# Patient Record
Sex: Male | Born: 1955 | Race: White | Hispanic: No | Marital: Married | Smoking: Former smoker
Health system: Southern US, Community
[De-identification: ages and names within clinical notes are randomized; demographics above are authoritative.]

## PROBLEM LIST (undated history)

## (undated) DIAGNOSIS — I739 Peripheral vascular disease, unspecified: Secondary | ICD-10-CM

## (undated) DIAGNOSIS — R251 Tremor, unspecified: Secondary | ICD-10-CM

## (undated) DIAGNOSIS — J439 Emphysema, unspecified: Secondary | ICD-10-CM

## (undated) DIAGNOSIS — F319 Bipolar disorder, unspecified: Secondary | ICD-10-CM

## (undated) DIAGNOSIS — E785 Hyperlipidemia, unspecified: Secondary | ICD-10-CM

## (undated) DIAGNOSIS — I10 Essential (primary) hypertension: Secondary | ICD-10-CM

## (undated) DIAGNOSIS — I639 Cerebral infarction, unspecified: Secondary | ICD-10-CM

## (undated) DIAGNOSIS — K5900 Constipation, unspecified: Secondary | ICD-10-CM

## (undated) DIAGNOSIS — K219 Gastro-esophageal reflux disease without esophagitis: Secondary | ICD-10-CM

## (undated) DIAGNOSIS — E039 Hypothyroidism, unspecified: Secondary | ICD-10-CM

## (undated) DIAGNOSIS — G629 Polyneuropathy, unspecified: Secondary | ICD-10-CM

## (undated) DIAGNOSIS — F1010 Alcohol abuse, uncomplicated: Secondary | ICD-10-CM

## (undated) DIAGNOSIS — N4281 Prostatodynia syndrome: Secondary | ICD-10-CM

## (undated) HISTORY — DX: Prostatodynia syndrome: N42.81

## (undated) HISTORY — PX: ILIAC ARTERY STENT: SHX1786

## (undated) HISTORY — DX: Constipation, unspecified: K59.00

## (undated) HISTORY — DX: Hypothyroidism, unspecified: E03.9

## (undated) HISTORY — DX: Tremor, unspecified: R25.1

## (undated) HISTORY — DX: Hyperlipidemia, unspecified: E78.5

## (undated) HISTORY — DX: Emphysema, unspecified: J43.9

## (undated) HISTORY — DX: Polyneuropathy, unspecified: G62.9

## (undated) HISTORY — DX: Essential (primary) hypertension: I10

## (undated) HISTORY — PX: OTHER SURGICAL HISTORY: SHX169

## (undated) HISTORY — DX: Peripheral vascular disease, unspecified: I73.9

## (undated) HISTORY — PX: COLONOSCOPY: SHX174

## (undated) HISTORY — DX: Alcohol abuse, uncomplicated: F10.10

## (undated) HISTORY — DX: Bipolar disorder, unspecified: F31.9

## (undated) HISTORY — DX: Cerebral infarction, unspecified: I63.9

## (undated) HISTORY — DX: Gastro-esophageal reflux disease without esophagitis: K21.9

---

## 2006-04-11 ENCOUNTER — Encounter: Payer: Self-pay | Admitting: Family Medicine

## 2006-04-11 ENCOUNTER — Ambulatory Visit: Payer: Self-pay | Admitting: Family Medicine

## 2006-04-11 DIAGNOSIS — M545 Low back pain, unspecified: Secondary | ICD-10-CM | POA: Insufficient documentation

## 2006-04-11 DIAGNOSIS — F3132 Bipolar disorder, current episode depressed, moderate: Secondary | ICD-10-CM

## 2006-04-11 DIAGNOSIS — E785 Hyperlipidemia, unspecified: Secondary | ICD-10-CM | POA: Insufficient documentation

## 2006-04-11 DIAGNOSIS — K219 Gastro-esophageal reflux disease without esophagitis: Secondary | ICD-10-CM | POA: Insufficient documentation

## 2006-04-11 DIAGNOSIS — R259 Unspecified abnormal involuntary movements: Secondary | ICD-10-CM | POA: Insufficient documentation

## 2006-04-11 DIAGNOSIS — F1010 Alcohol abuse, uncomplicated: Secondary | ICD-10-CM | POA: Insufficient documentation

## 2006-04-11 LAB — CONVERTED CEMR LAB
BUN: 15 mg/dL (ref 6–23)
CO2: 26 meq/L (ref 19–32)
Calcium: 9.9 mg/dL (ref 8.4–10.5)
Chloride: 103 meq/L (ref 96–112)
Cholesterol: 146 mg/dL (ref 0–200)
Creatinine, Ser: 0.88 mg/dL (ref 0.40–1.50)
Glucose, Bld: 81 mg/dL (ref 70–99)
HDL: 55 mg/dL (ref >39)
Total CHOL/HDL Ratio: 2.7
Triglycerides: 226 mg/dL — ABNORMAL HIGH (ref <150)

## 2006-04-12 ENCOUNTER — Encounter (INDEPENDENT_AMBULATORY_CARE_PROVIDER_SITE_OTHER): Payer: Self-pay | Admitting: *Deleted

## 2006-04-18 ENCOUNTER — Telehealth (INDEPENDENT_AMBULATORY_CARE_PROVIDER_SITE_OTHER): Payer: Self-pay | Admitting: *Deleted

## 2006-05-18 ENCOUNTER — Encounter: Payer: Self-pay | Admitting: Family Medicine

## 2006-07-06 ENCOUNTER — Encounter: Payer: Self-pay | Admitting: Family Medicine

## 2006-08-10 ENCOUNTER — Ambulatory Visit: Payer: Self-pay | Admitting: Family Medicine

## 2007-02-07 ENCOUNTER — Ambulatory Visit: Payer: Self-pay | Admitting: Family Medicine

## 2007-03-06 ENCOUNTER — Telehealth: Payer: Self-pay | Admitting: Family Medicine

## 2007-04-13 ENCOUNTER — Ambulatory Visit: Payer: Self-pay | Admitting: Family Medicine

## 2007-04-13 DIAGNOSIS — E039 Hypothyroidism, unspecified: Secondary | ICD-10-CM

## 2007-04-15 ENCOUNTER — Encounter: Payer: Self-pay | Admitting: Family Medicine

## 2007-04-17 ENCOUNTER — Encounter: Payer: Self-pay | Admitting: Family Medicine

## 2007-04-17 LAB — CONVERTED CEMR LAB
Albumin: 4.6 g/dL (ref 3.5–5.2)
BUN: 10 mg/dL (ref 6–23)
CO2: 23 meq/L (ref 19–32)
Calcium: 9.1 mg/dL (ref 8.4–10.5)
Chloride: 105 meq/L (ref 96–112)
Cholesterol: 161 mg/dL (ref 0–200)
Creatinine, Ser: 0.86 mg/dL (ref 0.40–1.50)
HDL goal, serum: 40 mg/dL
HDL: 61 mg/dL (ref >39)
LDL Goal: 160 mg/dL
PSA: 1.71 ng/mL (ref 0.10–4.00)
Potassium: 4.4 meq/L (ref 3.5–5.3)
Total CHOL/HDL Ratio: 2.6

## 2007-06-26 ENCOUNTER — Encounter: Payer: Self-pay | Admitting: Family Medicine

## 2007-07-18 ENCOUNTER — Telehealth: Payer: Self-pay | Admitting: Family Medicine

## 2007-07-19 ENCOUNTER — Encounter: Payer: Self-pay | Admitting: Family Medicine

## 2007-07-19 LAB — CONVERTED CEMR LAB
T3 Uptake Ratio: 35.4 % (ref 22.5–37.0)
T3, Free: 2.8 pg/mL (ref 2.3–4.2)

## 2007-08-09 ENCOUNTER — Encounter: Payer: Self-pay | Admitting: Family Medicine

## 2007-10-10 ENCOUNTER — Encounter: Payer: Self-pay | Admitting: Family Medicine

## 2007-11-27 ENCOUNTER — Encounter: Payer: Self-pay | Admitting: Family Medicine

## 2008-01-08 ENCOUNTER — Encounter: Payer: Self-pay | Admitting: Family Medicine

## 2008-01-30 HISTORY — PX: DEEP BRAIN STIMULATOR PLACEMENT: SHX608

## 2008-02-20 ENCOUNTER — Ambulatory Visit: Payer: Self-pay | Admitting: Family Medicine

## 2008-03-01 ENCOUNTER — Encounter: Payer: Self-pay | Admitting: Family Medicine

## 2008-03-04 ENCOUNTER — Encounter: Payer: Self-pay | Admitting: Family Medicine

## 2008-04-02 ENCOUNTER — Ambulatory Visit: Payer: Self-pay | Admitting: Family Medicine

## 2008-04-02 DIAGNOSIS — M25476 Effusion, unspecified foot: Secondary | ICD-10-CM

## 2008-04-02 DIAGNOSIS — R269 Unspecified abnormalities of gait and mobility: Secondary | ICD-10-CM

## 2008-04-02 DIAGNOSIS — M25473 Effusion, unspecified ankle: Secondary | ICD-10-CM

## 2008-04-03 LAB — CONVERTED CEMR LAB
ALT: 18 units/L (ref 0–53)
BUN: 10 mg/dL (ref 6–23)
CO2: 25 meq/L (ref 19–32)
Calcium: 9.7 mg/dL (ref 8.4–10.5)
Cholesterol: 135 mg/dL (ref 0–200)
Creatinine, Ser: 0.95 mg/dL (ref 0.40–1.50)
HDL: 40 mg/dL (ref >39)
Lithium Lvl: 0.15 meq/L — ABNORMAL LOW (ref 0.80–1.40)
Total Bilirubin: 0.4 mg/dL (ref 0.3–1.2)
Total CHOL/HDL Ratio: 3.4
Triglycerides: 245 mg/dL — ABNORMAL HIGH (ref <150)
VLDL: 49 mg/dL — ABNORMAL HIGH (ref 0–40)

## 2008-04-05 ENCOUNTER — Encounter: Payer: Self-pay | Admitting: Family Medicine

## 2008-04-10 ENCOUNTER — Telehealth: Payer: Self-pay | Admitting: Family Medicine

## 2008-04-15 ENCOUNTER — Encounter: Payer: Self-pay | Admitting: Family Medicine

## 2008-05-16 ENCOUNTER — Telehealth: Payer: Self-pay | Admitting: Family Medicine

## 2008-06-07 ENCOUNTER — Ambulatory Visit: Payer: Self-pay | Admitting: Family Medicine

## 2008-06-11 ENCOUNTER — Encounter: Payer: Self-pay | Admitting: Family Medicine

## 2008-06-19 ENCOUNTER — Encounter: Payer: Self-pay | Admitting: Family Medicine

## 2008-06-19 DIAGNOSIS — E118 Type 2 diabetes mellitus with unspecified complications: Secondary | ICD-10-CM | POA: Insufficient documentation

## 2008-06-19 DIAGNOSIS — R7301 Impaired fasting glucose: Secondary | ICD-10-CM

## 2008-07-27 ENCOUNTER — Encounter: Payer: Self-pay | Admitting: Family Medicine

## 2008-12-16 ENCOUNTER — Telehealth: Payer: Self-pay | Admitting: Family Medicine

## 2008-12-26 ENCOUNTER — Encounter: Payer: Self-pay | Admitting: Family Medicine

## 2009-04-14 ENCOUNTER — Encounter: Admission: RE | Admit: 2009-04-14 | Discharge: 2009-04-14 | Payer: Self-pay | Admitting: Family Medicine

## 2009-04-14 ENCOUNTER — Ambulatory Visit: Payer: Self-pay | Admitting: Family Medicine

## 2009-04-14 DIAGNOSIS — R05 Cough: Secondary | ICD-10-CM

## 2009-04-14 DIAGNOSIS — N508 Other specified disorders of male genital organs: Secondary | ICD-10-CM

## 2009-04-14 DIAGNOSIS — R059 Cough, unspecified: Secondary | ICD-10-CM | POA: Insufficient documentation

## 2009-04-14 DIAGNOSIS — R6882 Decreased libido: Secondary | ICD-10-CM | POA: Insufficient documentation

## 2009-04-14 DIAGNOSIS — M25559 Pain in unspecified hip: Secondary | ICD-10-CM | POA: Insufficient documentation

## 2009-04-15 LAB — CONVERTED CEMR LAB
ALT: 19 units/L (ref 0–53)
AST: 20 units/L (ref 0–37)
Albumin: 4.3 g/dL (ref 3.5–5.2)
Alkaline Phosphatase: 76 units/L (ref 39–117)
LDL Cholesterol: 44 mg/dL (ref 0–99)
Potassium: 4.6 meq/L (ref 3.5–5.3)
Sex Hormone Binding: 22 nmol/L (ref 13–71)
Sodium: 140 meq/L (ref 135–145)
TSH: 0.048 microintl units/mL — ABNORMAL LOW (ref 0.350–4.500)
Testosterone Free: 77.1 pg/mL (ref 47.0–244.0)
Testosterone-% Free: 2.4 % (ref 1.6–2.9)
Testosterone: 314.86 ng/dL — ABNORMAL LOW (ref 350–890)
Total Bilirubin: 0.4 mg/dL (ref 0.3–1.2)
Total Protein: 6.6 g/dL (ref 6.0–8.3)
Triglycerides: 207 mg/dL — ABNORMAL HIGH (ref <150)
VLDL: 41 mg/dL — ABNORMAL HIGH (ref 0–40)

## 2009-04-16 ENCOUNTER — Encounter: Payer: Self-pay | Admitting: Family Medicine

## 2009-04-21 ENCOUNTER — Ambulatory Visit: Payer: Self-pay | Admitting: Family Medicine

## 2009-04-21 DIAGNOSIS — J449 Chronic obstructive pulmonary disease, unspecified: Secondary | ICD-10-CM

## 2009-04-29 ENCOUNTER — Encounter: Payer: Self-pay | Admitting: Family Medicine

## 2009-05-01 ENCOUNTER — Encounter: Payer: Self-pay | Admitting: Family Medicine

## 2009-07-22 ENCOUNTER — Ambulatory Visit: Payer: Self-pay | Admitting: Family Medicine

## 2009-07-22 DIAGNOSIS — K589 Irritable bowel syndrome without diarrhea: Secondary | ICD-10-CM

## 2009-07-23 LAB — CONVERTED CEMR LAB: TSH: 0.366 microintl units/mL (ref 0.350–4.500)

## 2009-08-30 ENCOUNTER — Ambulatory Visit: Payer: Self-pay | Admitting: Family Medicine

## 2009-08-31 ENCOUNTER — Encounter: Payer: Self-pay | Admitting: Family Medicine

## 2009-10-08 ENCOUNTER — Ambulatory Visit: Payer: Self-pay | Admitting: Family Medicine

## 2009-10-08 DIAGNOSIS — E291 Testicular hypofunction: Secondary | ICD-10-CM | POA: Insufficient documentation

## 2009-10-08 DIAGNOSIS — G250 Essential tremor: Secondary | ICD-10-CM

## 2009-10-08 DIAGNOSIS — R5383 Other fatigue: Secondary | ICD-10-CM | POA: Insufficient documentation

## 2009-10-10 ENCOUNTER — Encounter: Payer: Self-pay | Admitting: Family Medicine

## 2009-10-10 LAB — CONVERTED CEMR LAB
Albumin: 4.5 g/dL (ref 3.5–5.2)
BUN: 8 mg/dL (ref 6–23)
Calcium: 8.9 mg/dL (ref 8.4–10.5)
Chloride: 105 meq/L (ref 96–112)
Creatinine, Ser: 0.96 mg/dL (ref 0.40–1.50)
Eosinophils Absolute: 0.2 10*3/uL (ref 0.0–0.7)
Eosinophils Relative: 1 % (ref 0–5)
Glucose, Bld: 118 mg/dL — ABNORMAL HIGH (ref 70–99)
HCT: 44.8 % (ref 39.0–52.0)
Hemoglobin: 14.7 g/dL (ref 13.0–17.0)
Lithium Lvl: 0.35 meq/L — ABNORMAL LOW (ref 0.80–1.40)
Lymphocytes Relative: 15 % (ref 12–46)
Lymphs Abs: 1.6 10*3/uL (ref 0.7–4.0)
MCV: 92.9 fL (ref 78.0–100.0)
Monocytes Absolute: 0.5 10*3/uL (ref 0.1–1.0)
Platelets: 223 10*3/uL (ref 150–400)
Potassium: 4.7 meq/L (ref 3.5–5.3)
RDW: 13.2 % (ref 11.5–15.5)
TSH: 0.354 microintl units/mL (ref 0.350–4.500)
Testosterone-% Free: 2.1 % (ref 1.6–2.9)
Testosterone: 290.54 ng/dL — ABNORMAL LOW (ref 350–890)
Total CK: 106 units/L (ref 7–232)

## 2009-10-13 ENCOUNTER — Encounter: Payer: Self-pay | Admitting: Family Medicine

## 2009-11-03 ENCOUNTER — Telehealth (INDEPENDENT_AMBULATORY_CARE_PROVIDER_SITE_OTHER): Payer: Self-pay | Admitting: *Deleted

## 2009-11-06 ENCOUNTER — Ambulatory Visit: Payer: Self-pay | Admitting: Family

## 2009-11-07 ENCOUNTER — Encounter: Payer: Self-pay | Admitting: Family

## 2009-11-07 LAB — CONVERTED CEMR LAB: Sed Rate: 4 mm/hr (ref 0–16)

## 2009-11-10 ENCOUNTER — Telehealth: Payer: Self-pay | Admitting: Family

## 2009-11-10 ENCOUNTER — Encounter: Payer: Self-pay | Admitting: Family

## 2009-11-12 ENCOUNTER — Encounter: Payer: Self-pay | Admitting: Family

## 2009-11-17 ENCOUNTER — Encounter: Payer: Self-pay | Admitting: Family

## 2009-11-25 ENCOUNTER — Encounter: Payer: Self-pay | Admitting: Family Medicine

## 2009-12-03 ENCOUNTER — Encounter: Payer: Self-pay | Admitting: Family Medicine

## 2009-12-03 ENCOUNTER — Ambulatory Visit: Payer: Self-pay | Admitting: Family

## 2009-12-17 ENCOUNTER — Telehealth: Payer: Self-pay | Admitting: Family

## 2009-12-29 ENCOUNTER — Ambulatory Visit: Payer: Self-pay | Admitting: Family Medicine

## 2009-12-30 ENCOUNTER — Encounter: Payer: Self-pay | Admitting: Family Medicine

## 2010-01-01 LAB — CONVERTED CEMR LAB
PSA: 1.12 ng/mL
Sex Hormone Binding: 25 nmol/L
Testosterone Free: 67 pg/mL
Testosterone-% Free: 2.3 %
Testosterone: 293.04 ng/dL — ABNORMAL LOW

## 2010-01-29 ENCOUNTER — Encounter: Payer: Self-pay | Admitting: Family Medicine

## 2010-02-06 ENCOUNTER — Encounter: Payer: Self-pay | Admitting: Family Medicine

## 2010-03-04 ENCOUNTER — Encounter: Payer: Self-pay | Admitting: Family Medicine

## 2010-06-09 ENCOUNTER — Ambulatory Visit
Admission: RE | Admit: 2010-06-09 | Discharge: 2010-06-09 | Payer: Self-pay | Source: Home / Self Care | Attending: Family Medicine | Admitting: Family Medicine

## 2010-06-09 DIAGNOSIS — J019 Acute sinusitis, unspecified: Secondary | ICD-10-CM | POA: Insufficient documentation

## 2010-06-16 ENCOUNTER — Encounter: Payer: Self-pay | Admitting: Family Medicine

## 2010-06-16 ENCOUNTER — Ambulatory Visit
Admission: RE | Admit: 2010-06-16 | Discharge: 2010-06-16 | Payer: Self-pay | Source: Home / Self Care | Attending: Family Medicine | Admitting: Family Medicine

## 2010-06-16 DIAGNOSIS — F17200 Nicotine dependence, unspecified, uncomplicated: Secondary | ICD-10-CM | POA: Insufficient documentation

## 2010-06-30 NOTE — Assessment & Plan Note (Signed)
Summary: F/u due to Angrogel not working, fatigue and pain- jr   Vital Signs:  Patient profile:   55 year old male Height:      74 inches Weight:      189 pounds Pulse rate:   62 / minute BP sitting:   129 / 66  (left arm) Cuff size:   regular  Vitals Entered By: Kathlene November (November 06, 2009 4:07 PM) CC: follow-up continues to have muscle pain in legs and fatigue. Androgel not helping per patient   Primary Care Provider:  Linford Arnold, C  CC:  follow-up continues to have muscle pain in legs and fatigue. Androgel not helping per patient.  History of Present Illness: Mr Eddie Hernandez is a 55 year old male who presents today with complaint of muscle aches  and shooting pains in bilateral legs and now in the right upper arm.  Denies any soreness to the touch.  Denies fever.  Rest improves the pain, but does not resolve the pain.  Notes + history of essential tremors (had brain implant which resolved his tremors).   Notes that he has been stumbling more- wife has been telling him that he looks like the muscles in his legs are "shrinking"- especially his thigh muscle.  Works in [employer]- does a lot of lifting.  Finds it "almost impossible" to do his job.  Sees Dr Hale Bogus (Neurology) and Dr. Kendell Bane.  Follows up once a year.    Current Medications (verified): 1)  Cymbalta 60 Mg Cpep (Duloxetine Hcl) .... Take One Tablet By Mouth Once A Day 2)  Lithium Carbonate 450 Mg Cr-Tabs (Lithium Carbonate) .... Take One Tablet By Mouht Once Ad Ay 3)  Levoxyl 137 Mcg Tabs (Levothyroxine Sodium) .... Take 1 Tablet By Mouth Once A Day 4)  Deplin 7.5 Mg Tabs (L-Methylfolate) .... Take One Tablt By Mouth in The Mornings 5)  Zyprexa 7.5 Mg Tabs (Olanzapine) .... Take One Tablet By Mouth At Bedtime 6)  Neurontin 300 Mg Caps (Gabapentin) .... Take One Tablet By Mouth Four Times A Day 7)  Proair Hfa 108 (90 Base) Mcg/act Aers (Albuterol Sulfate) .... 2-4 Puff Inhaled As Needed For Shortness of Breath. 8)  Androgel  Pump 1 % Gel (Testosterone) .... Apply 5 Grams Qam  Allergies (verified): 1)  Sulfa  Comments:  Nurse/Medical Assistant: The patient's medications and allergies were reviewed with the patient and were updated in the Medication and Allergy Lists. Kathlene November (November 06, 2009 4:08 PM)  Physical Exam  General:  Well-developed,well-nourished,in no acute distress; alert,appropriate and cooperative throughout examination Head:  Normocephalic and atraumatic without obvious abnormalities. No apparent alopecia or balding. Neck:  No deformities, masses, or tenderness noted. Neurologic:  alert & oriented X3, cranial nerves II-XII intact, and DTRs symmetrical and normal.  Bilateral LE strength 4-5/5 strength.   Impression & Recommendations:  Problem # 1:  MUSCLE WEAKNESS (GENERALIZED) (ICD-728.87) Will check below labs.  Will also refer to Dr. Donnetta Simpers (with whom patient is already established) for further evaluation.  Unfortunately, patient cannot undergo MRI due to hx of Deep Brain Stimulator.  MS is a possibility or other degenerative neurological disorder such as myasthenia gravis, Parkinsons.    Orders: T-ESR [facility]) [phone]) Jackie Plum [phone]) T-Rheumatoid Factor [phone]) T-Vitamin B12 [phone]) T-Lyme Disease [phone]) Neurology Referral (Neuro)  Complete Medication List: 1)  Cymbalta 60 Mg Cpep (Duloxetine hcl) .... Take one tablet by mouth once a day 2)  Lithium Carbonate 450 Mg Cr-tabs (Lithium carbonate) .Marland KitchenMarland KitchenMarland Kitchen  Take one tablet by mouht once ad ay 3)  Levoxyl 137 Mcg Tabs (Levothyroxine sodium) .... Take 1 tablet by mouth once a day 4)  Deplin 7.5 Mg Tabs (L-methylfolate) .... Take one tablt by mouth in the mornings 5)  Zyprexa 7.5 Mg Tabs (Olanzapine) .... Take one tablet by mouth at bedtime 6)  Neurontin 300 Mg Caps (Gabapentin) .... Take one tablet by mouth four times a day 7)  Proair Hfa 108 (90 Base) Mcg/act Aers (Albuterol sulfate) .... 2-4 puff inhaled  as needed for shortness of breath. 8)  Androgel Pump 1 % Gel (Testosterone) .... Apply 5 grams qam  Patient Instructions: 1)  You will be contacted about your referral to Neurology. 2)  Complete lab work today. 3)  Follow up in 1 month, sooner if symtoms worsen or do not improve.

## 2010-06-30 NOTE — Miscellaneous (Signed)
  Clinical Lists Changes  Observations: Added new observation of FLU VAX: Historical (02/28/2010 16:02)      Immunization History:  Influenza Immunization History:    Influenza:  historical (02/28/2010)

## 2010-06-30 NOTE — Assessment & Plan Note (Signed)
Summary: FU thyroid, change in bowels, etc   Vital Signs:  Patient profile:   55 year old male Height:      74 inches Weight:      188 pounds Pulse rate:   75 / minute BP sitting:   124 / 68  (left arm) Cuff size:   regular  Vitals Entered By: Kathlene November (July 22, 2009 3:54 PM) CC: recheck TSH level   Primary Care Provider:  Linford Arnold, C  CC:  recheck TSH level.  History of Present Illness: recheck TSH level.  Has lost about 4 lbs.  Denies any thyroid sxs.  No stomach problems.   having Gi problems.  Seems to swing between diarrhea and constipation. Tried increasing his fiber but felt his body just gets used to it.  Gets occ abdominal bloating. Tries to eat the high fiber cereals.  No blood in the stool. No known triggers for lose stools. Does get abdominal cramping that is resolved by having a BM.  No alleviating sxs.   Current Medications (verified): 1)  Crestor 10 Mg Tabs (Rosuvastatin Calcium) .... Take 1 Tablet By Mouth Once A Day 2)  Prevacid Solutab 30 Mg  Tbdp (Lansoprazole) .... Take 1 Tablet By Mouth Once A Day 3)  Cymbalta 60 Mg Cpep (Duloxetine Hcl) .... Take One Tablet By Mouth Once A Day 4)  Lithium Carbonate 450 Mg Cr-Tabs (Lithium Carbonate) .... Take One Tablet By Mouht Once Ad Ay 5)  Levoxyl 137 Mcg Tabs (Levothyroxine Sodium) .... Take 1 Tablet By Mouth Once A Day 6)  Deplin 7.5 Mg Tabs (L-Methylfolate) .... Take One Tablt By Mouth in The Mornings 7)  Zyprexa 7.5 Mg Tabs (Olanzapine) .... Take One Tablet By Mouth At Bedtime 8)  Neurontin 300 Mg Caps (Gabapentin) .... Take One Tablet By Mouth Four Times A Day 9)  Proair Hfa 108 (90 Base) Mcg/act Aers (Albuterol Sulfate) .... 2-4 Puff Inhaled As Needed For Shortness of Breath.  Allergies (verified): 1)  Sulfa  Comments:  Nurse/Medical Assistant: The patient's medications and allergies were reviewed with the patient and were updated in the Medication and Allergy Lists. Kathlene November (July 22, 2009 3:55  PM)  Physical Exam  General:  Well-developed,well-nourished,in no acute distress; alert,appropriate and cooperative throughout examination Neck:  No deformities, masses, or tenderness noted. NO TM/  Lungs:  Normal respiratory effort, chest expands symmetrically. Lungs are clear to auscultation, no crackles or wheezes. Heart:  Normal rate and regular rhythm. S1 and S2 normal without gallop, murmur, click, rub or other extra sounds. Skin:  no rashes.   Psych:  Cognition and judgment appear intact. Alert and cooperative with normal attention span and concentration. No apparent delusions, illusions, hallucinations   Impression & Recommendations:  Problem # 1:  HYPOTHYROIDISM (ICD-244.9) Due to recheck since we lowered hismedication last time.   His updated medication list for this problem includes:    Levoxyl 137 Mcg Tabs (Levothyroxine sodium) .Marland Kitchen... Take 1 tablet by mouth once a day  Orders: T-TSH [id number])  Labs Reviewed: TSH: 0.048 (04/14/2009)    HgBA1c: 6.0 (06/18/2008) Chol: 122 (04/14/2009)   HDL: 37 (04/14/2009)   LDL: 44 (04/14/2009)   TG: 207 (04/14/2009)  Problem # 2:  TESTICULAR MASS (ICD-608.89) Saw Urology and just had a cyst. So they are following this.    Problem # 3:  LIBIDO, DECREASED (ICD-799.81) Offered to recheck testosterone but pt declined at this time.   Problem # 4:  IBS (ICD-564.1) Based on his sxs I  do think he has IBS. Recommend be consistant with fiber, thought can cause bloating and gas. Had a normal colonoscopy in 2008. If would like can refer to GI. Pt declines at this time.   Complete Medication List: 1)  Crestor 10 Mg Tabs (Rosuvastatin calcium) .... Take 1 tablet by mouth once a day 2)  Prevacid Solutab 30 Mg Tbdp (Lansoprazole) .... Take 1 tablet by mouth once a day 3)  Cymbalta 60 Mg Cpep (Duloxetine hcl) .... Take one tablet by mouth once a day 4)  Lithium Carbonate 450 Mg Cr-tabs (Lithium carbonate) .... Take one tablet by mouht once ad  ay 5)  Levoxyl 137 Mcg Tabs (Levothyroxine sodium) .... Take 1 tablet by mouth once a day 6)  Deplin 7.5 Mg Tabs (L-methylfolate) .... Take one tablt by mouth in the mornings 7)  Zyprexa 7.5 Mg Tabs (Olanzapine) .... Take one tablet by mouth at bedtime 8)  Neurontin 300 Mg Caps (Gabapentin) .... Take one tablet by mouth four times a day 9)  Proair Hfa 108 (90 Base) Mcg/act Aers (Albuterol sulfate) .... 2-4 puff inhaled as needed for shortness of breath.

## 2010-06-30 NOTE — Letter (Signed)
Summary: [facility]  [facility]   Imported By: Lanelle Bal 01/15/2010 12:19:49  _____________________________________________________________________  External Attachment:    Type:   Image     Comment:   External Document

## 2010-06-30 NOTE — Progress Notes (Signed)
  Phone Note Outgoing Call   Summary of Call: Pls call patient and let him know that the lab results were normal.  He should follow up with neuro as we discussed. Thanks Initial call taken by: Lemont Fillers FNP,  November 10, 2009 3:06 PM  Follow-up for Phone Call        Pt said you were going to send a coprehensive report to Dr. Hale Bogus his neuro and then they would call him for an appointment. I didn't know what he was talking about so i told him i would send the message to you Follow-up by: Kathlene November,  November 11, 2009 8:13 AM  Additional Follow-up for Phone Call Additional follow up Details #1::        I sent flag to [facility] RE: neuro referral- she is out today.  Looks like info was sent to Dr. Beatris Ship office, however he actually needs referral sent to Dr. Hale Bogus instead.  Could you please help to arrange this and send most recent labs/office note along?  thanks. Additional Follow-up by: Lemont Fillers FNP,  November 11, 2009 8:50 AM    Additional Follow-up for Phone Call Additional follow up Details #2::    Scheduled pt with Dr. Hale Bogus at [facility]. Pt notified via VM and records sent Follow-up by: Kathlene November,  November 11, 2009 9:14 AM

## 2010-06-30 NOTE — Letter (Signed)
Summary: [facility]  [facility]   Imported By: Lanelle Bal 11/25/2009 12:26:52  _____________________________________________________________________  External Attachment:    Type:   Image     Comment:   External Document

## 2010-06-30 NOTE — Assessment & Plan Note (Signed)
Summary: normal PVR with exercise      Appended Document: normal PVR with exercise Pls let pt know that his LE arterial doppler test for PAD was normal.  Seymour Bars, D.O.  Appended Document: normal PVR with exercise pt aware

## 2010-06-30 NOTE — Letter (Signed)
Summary: [facility]  [facility]   Imported By: Lanelle Bal 11/20/2009 14:31:57  _____________________________________________________________________  External Attachment:    Type:   Image     Comment:   External Document

## 2010-06-30 NOTE — Assessment & Plan Note (Signed)
Summary: CUT FINGER/TM  (right arm) Cuff size:   regular  Vitals Entered By: Areta Haber CMA (August 30, 2009 4:53 PM)                  Current Allergies: SULFA     History of Present Illness Chief Complaint: Laceration- R finger x today History of Present Illness: Subjective:  Patient complains of cutting his left 4th finger with utility knife today.  He does not remember his last tetanus shot.  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)   Current Meds CRESTOR 10 MG TABS (ROSUVASTATIN CALCIUM) Take 1 tablet by mouth once a day PREVACID SOLUTAB 30 MG  TBDP (LANSOPRAZOLE) Take 1 tablet by mouth once a day CYMBALTA 60 MG CPEP (DULOXETINE HCL) Take one tablet by mouth once a day LITHIUM CARBONATE 450 MG CR-TABS (LITHIUM CARBONATE) Take one tablet by mouht once ad ay LEVOXYL 137 MCG TABS (LEVOTHYROXINE SODIUM) Take 1 tablet by mouth once a day DEPLIN 7.5 MG TABS (L-METHYLFOLATE) take one tablt by mouth in the mornings ZYPREXA 7.5 MG TABS (OLANZAPINE) take one tablet by mouth at bedtime NEURONTIN 300 MG CAPS (GABAPENTIN) Take one tablet by mouth four times a day PROAIR HFA 108 (90 BASE) MCG/ACT AERS (ALBUTEROL SULFATE) 2-4 puff inhaled as needed for shortness of breath.  REVIEW OF SYSTEMS Constitutional Symptoms      Denies fever, chills, night sweats, weight loss, weight gain, and fatigue.  Eyes       Denies change in vision, eye pain, eye  discharge, glasses, contact lenses, and eye surgery. Ear/Nose/Throat/Mouth       Denies hearing loss/aids, change in hearing, ear pain, ear discharge, dizziness, frequent runny nose, frequent nose bleeds, sinus problems, sore throat, hoarseness, and tooth pain or bleeding.  Respiratory       Denies dry cough, productive cough, wheezing, shortness of breath, asthma, bronchitis, and emphysema/COPD.  Cardiovascular       Denies murmurs, chest pain, and tires easily with exhertion.    Gastrointestinal       Denies stomach pain, nausea/vomiting, diarrhea, constipation, blood in bowel movements, and indigestion. Genitourniary       Denies painful urination, kidney stones, and loss of urinary control. Neurological       Denies paralysis, seizures, and fainting/blackouts. Musculoskeletal       Denies muscle pain, joint pain, joint stiffness, decreased range of motion, redness, swelling, muscle weakness, and gout.  Skin       Denies bruising, unusual mles/lumps or sores, and hair/skin or nail changes.  Psych       Denies mood changes, temper/anger issues, anxiety/stress, speech problems, depression, and sleep problems. Other Comments: Laceration R index finger x today caulking in home.   Past History:  Past Medical History: Last updated: 04/13/2007 Hx of occ prostate pain.    Past Surgical History: Last updated: 04/02/2008 Deep brain stimular for tremor  01-30-08  Family History: Last updated: 04/13/2007 Father with stroke.  Sister wtih BrCa.  Social History: Last updated: 04/02/2008 Works at [employer] on [address] and then part time in evenings at [employer].  Married to [employer]. No kids. 5  dogs.   Current Smoker.    Alcohol use-yes Drug use-no Regular exercise-no  Risk Factors: Alcohol Use: 4+ (04/11/2006) Caffeine Use: 1-2 (04/11/2006) Exercise: no (04/11/2006)  Risk Factors: Smoking Status: current (04/11/2006) Packs/Day: 1 (08/10/2006) Passive Smoke Exposure:  no (04/13/2007)   Objective:  No acute distress  Left hand 4th finger:  proximal phalanx, 8mm simple laceration.  Finger has full range of motion.  Distal neurovascular intact  Assessment New Problems: LACERATION OF FINGER (ICD-883.0)   Plan New Orders: TD Toxoids IM 7 YR + [90714] Admin 1st Vaccine [90471] Admin 1st Vaccine [facility]) [phone] New Patient Level III [99203] Repair Superficial Wound(s) 2.5cm or <(scalp,neck,axillae,ext gent,trunk,extre) [12001] Planning Comments:   Td administered Wound precautions.  Change bandage daily with Bacitracin Return 10 days for suture removal.   The patient and/or caregiver has been counseled thoroughly with regard to medications prescribed including dosage, schedule, interactions, rationale for use, and possible side effects and they verbalize understanding.  Diagnoses and expected course of recovery discussed and will return if not improved as expected or if the condition worsens. Patient and/or caregiver verbalized understanding.   PROCEDURE:  Suture Site: Left 4th finger Size: 8mm Number of Lacerations: one Anesthesia: 1% Lidocaine Procedure: Procedure:  Laceration Repair Discussed benefits and risks of procedure and verbal consent obtained. Using sterile technique and local 1% lidocaine without epinephrine, cleansed wound with Betadine followed by copious lavage with normal saline.  Wound carefully inspected for debris and foreign bodies; none found.  Wound closed with #3 , 4-0 interrupted nylon sutures.  Bacitracin and non-stick sterile dressing applied.  Wound precautions explained to patient.    Disposition: Home   Tetanus/Td Vaccine    Vaccine Type: Td    Site: left deltoid    Mfr: Sanofi Pasteur    Dose: 0.5 ml    Route: IM    Given by: Areta Haber CMA    Exp. Date: 09/03/2010    Lot #: [id number]    VIS given: 04/18/07 version given August 30, 2009.

## 2010-06-30 NOTE — Assessment & Plan Note (Signed)
Summary: FAtigue, muscle weakness   Vital Signs:  Patient profile:   55 year old male Height:      74 inches Weight:      190 pounds Pulse rate:   65 / minute BP sitting:   139 / 85  (left arm) Cuff size:   regular  Vitals Entered By: Kathlene November (Oct 08, 2009 8:08 AM) CC: fatigue, muscle weakness and pain in both legs   Primary Care Provider:  Linford Arnold, C  CC:  fatigue and muscle weakness and pain in both legs.  History of Present Illness: fatigue, muscle weakness and pain in both legs.  Can do work for an hour and then feels worn out. Gets burning in the upper thigh adn calves with walking and acitivity.  Then has a hard time lifting his feet off the ground.  if stops ad rest the burn will ease off in 5 mintues but will still have a low grade ache. Feels better when lays down. Feels like has lost some weight. No family hx of premature heart dz.  Did have a low testosterone .  Says his fatehr had his legs amputated for "poor blood flow". No hx of diabetes.  No chagne in handwriting.   Current Medications (verified): 1)  Crestor 10 Mg Tabs (Rosuvastatin Calcium) .... Take 1 Tablet By Mouth Once A Day 2)  Prevacid Solutab 30 Mg  Tbdp (Lansoprazole) .... Take 1 Tablet By Mouth Once A Day 3)  Cymbalta 60 Mg Cpep (Duloxetine Hcl) .... Take One Tablet By Mouth Once A Day 4)  Lithium Carbonate 450 Mg Cr-Tabs (Lithium Carbonate) .... Take One Tablet By Mouht Once Ad Ay 5)  Levoxyl 137 Mcg Tabs (Levothyroxine Sodium) .... Take 1 Tablet By Mouth Once A Day 6)  Deplin 7.5 Mg Tabs (L-Methylfolate) .... Take One Tablt By Mouth in The Mornings 7)  Zyprexa 7.5 Mg Tabs (Olanzapine) .... Take One Tablet By Mouth At Bedtime 8)  Neurontin 300 Mg Caps (Gabapentin) .... Take One Tablet By Mouth Four Times A Day 9)  Proair Hfa 108 (90 Base) Mcg/act Aers (Albuterol Sulfate) .... 2-4 Puff Inhaled As Needed For Shortness of Breath.  Allergies (verified): 1)  Sulfa  Comments:  Nurse/Medical  Assistant: The patient's medications and allergies were reviewed with the patient and were updated in the Medication and Allergy Lists. Kathlene November (Oct 08, 2009 8:08 AM)  Past History:  Past Surgical History: Last updated: 04/02/2008 Deep brain stimular for tremor  01-30-08  Social History: Last updated: 04/02/2008 Works at [employer] on [address] and then part time in evenings at [employer].  Married to [employer]. No kids. 5 dogs.   Current Smoker.    Alcohol use-yes Drug use-no Regular exercise-no  Social History: Reviewed history from 04/02/2008 and no changes required. Works at [employer] on [address] and then part time in evenings at [employer].  Married to [employer]. No kids. 5 dogs.   Current Smoker.    Alcohol use-yes Drug use-no Regular exercise-no  Physical Exam  General:  Well-developed,well-nourished,in no acute distress; alert,appropriate and cooperative throughout examination Head:  Normocephalic and atraumatic without obvious abnormalities. No apparent alopecia or balding. Neck:  No deformities, masses, or tenderness noted. NO TM.  Lungs:  Normal respiratory effort, chest expands symmetrically. Lungs are clear to auscultation, no crackles or wheezes. Heart:  Normal rate and regular rhythm. S1 and S2 normal without gallop, murmur, click, rub or other extra sounds. Msk:  Strength 5/5 in the UE and  LE.  Neurologic:  alert & oriented X3 and DTRs symmetrical and normal.  Unable to walk backwards heelt o toe with out losing balance or walk backwards on tip toes. Also had some difficulty with heel to toe going forward.   Skin:  no rashes.   Cervical Nodes:  No lymphadenopathy noted Psych:  Cognition and judgment appear intact. Alert and cooperative with normal attention span and concentration. No apparent delusions, illusions, hallucinations   Impression & Recommendations:  Problem # 1:  FATIGUE (ICD-780.79) Discussed will recheck his testosterone and rule  out anemia. Had normal CMP in November.  Will recheck today to rule out low K since having muscle cramping in his legs as well. Will also check lithium level and make sure he is not toxic.   Orders: T-CBC w/Diff ([id number]) T-Comprehensive Metabolic Panel ([id number])  Problem # 2:  MUSCLE WEAKNESS (GENERALIZED) (ICD-728.87) Discussed will check CK level and have him hold his crestor for now. Also will schedule for dopplers to look for claudication and some of his sxs are consistant wiht claudication as well.  If all normal then consider further referral for peripheral neuropathy to Neurology.  Orders: T-CK Total [phone]) T-*Unlisted Diagnostic X-ray test/procedure (57846)  Complete Medication List: 1)  Crestor 10 Mg Tabs (Rosuvastatin calcium) .... Take 1 tablet by mouth once a day 2)  Prevacid Solutab 30 Mg Tbdp (Lansoprazole) .... Take 1 tablet by mouth once a day 3)  Cymbalta 60 Mg Cpep (Duloxetine hcl) .... Take one tablet by mouth once a day 4)  Lithium Carbonate 450 Mg Cr-tabs (Lithium carbonate) .... Take one tablet by mouht once ad ay 5)  Levoxyl 137 Mcg Tabs (Levothyroxine sodium) .... Take 1 tablet by mouth once a day 6)  Deplin 7.5 Mg Tabs (L-methylfolate) .... Take one tablt by mouth in the mornings 7)  Zyprexa 7.5 Mg Tabs (Olanzapine) .... Take one tablet by mouth at bedtime 8)  Neurontin 300 Mg Caps (Gabapentin) .... Take one tablet by mouth four times a day 9)  Proair Hfa 108 (90 Base) Mcg/act Aers (Albuterol sulfate) .... 2-4 puff inhaled as needed for shortness of breath.  Other Orders: T-TSH [phone]) T-Testosterone, Free and Total [id number]) T-Lithium Level [phone])  Appended Document: FAtigue, muscle weakness Sxs for several months. Seems to be getting worse.

## 2010-06-30 NOTE — Progress Notes (Signed)
Summary: Androgel not working  Phone Note Call from Patient Call back at [employer] [phone]   Caller: Patient Summary of Call: On Androgel still with pain and weakness in legs and arms, fatique, especially leg pain. Doesn't feel this med is working for him and was told to call back if not working Initial call taken by: Kathlene November,  November 03, 2009 8:48 AM  Follow-up for Phone Call        see if you can get him in with Alma DownsLendell Caprice NP this wk for f/u. Follow-up by: Seymour Bars DO,  November 03, 2009 9:37 AM  Additional Follow-up for Phone Call Additional follow up Details #1::        I scheduled pt Thurs. 11/07/09 at 4:15 with Buel Ream NP.Michaelle Copas  November 03, 2009 1:15 PM

## 2010-06-30 NOTE — Assessment & Plan Note (Signed)
Summary: F/u  testosterone, needs to restart chol med, etc   Vital Signs:  Patient profile:   55 year old male Height:      74 inches Weight:      192 pounds BMI:     24.74 Pulse rate:   72 / minute BP sitting:   133 / 74  (left arm) Cuff size:   regular  Vitals Entered By: Avon Gully CMA, Duncan Dull) (December 29, 2009 2:05 PM) CC: f/u meds Pain Assessment Patient in pain? yes      CC: Follow-up HTN HPI: Taking Meds? Side Effects? Chest Pain, SOB, Dizziness? A/P: HTN(401.1) At Goal? If no, MD will be notified. Follow-up in--  5 minutes was spent with the pt.   Primary Care Provider:  Linford Arnold, C  CC:  f/u meds.  History of Present Illness: Saw dr. Lin Givens this AM with Neurology this AM for evaluation of MS. had a fairly normal EMG this AM.  Getting heavy metal screening. Had normal EEG, LE dopplers.   Due to recheck the testosterone. He says he really doesn't feel any different on it. No side effects.    He would like to restart his statin since he is starting zyrpexa as if often increases lipids. He came off of the crestor because of the leg pain but really hasn'tnoticed a difference since stopping it.    Current Medications (verified): 1)  Cymbalta 60 Mg Cpep (Duloxetine Hcl) .... Take One Tablet By Mouth Once A Day 2)  Lithium Carbonate 450 Mg Cr-Tabs (Lithium Carbonate) .... Take One Tablet By Mouht Once Ad Ay 3)  Levoxyl 137 Mcg Tabs (Levothyroxine Sodium) .... Take 1 Tablet By Mouth Once A Day 4)  Deplin 7.5 Mg Tabs (L-Methylfolate) .... Take One Tablt By Mouth in The Mornings 5)  Zyprexa 7.5 Mg Tabs (Olanzapine) .... Take One Tablet By Mouth At Bedtime 6)  Neurontin 300 Mg Caps (Gabapentin) .... Take One Tablet By Mouth Four Times A Day 7)  Proair Hfa 108 (90 Base) Mcg/act Aers (Albuterol Sulfate) .... 2-4 Puff Inhaled As Needed For Shortness of Breath. 8)  Androgel Pump 1 % Gel (Testosterone) .... Apply 5 Grams Qam  Allergies (verified): 1)   Sulfa  Physical Exam  General:  Well-developed,well-nourished,in no acute distress; alert,appropriate and cooperative throughout examination Head:  Normocephalic and atraumatic without obvious abnormalities. No apparent alopecia or balding. Lungs:  Normal respiratory effort, chest expands symmetrically. Lungs are clear to auscultation, no crackles or wheezes. Heart:  Normal rate and regular rhythm. S1 and S2 normal without gallop, murmur, click, rub or other extra sounds. Psych:  Cognition and judgment appear intact. Alert and cooperative with normal attention span and concentration. No apparent delusions, illusions, hallucinations   Impression & Recommendations:  Problem # 1:  TESTICULAR HYPOFUNCTION (ICD-257.2) Due to recheck to make sure at goal.  He may be subtheraputic. will check PSA as well. He has had labs checked by neurology that included a normal liver panel.   Orders: T-Testosterone, Free and Total [id number]) T-PSA [id number])  Problem # 2:  MUSCLE WEAKNESS (GENERALIZED) (ICD-728.87) He is following with Neurology adn being evaluated for possible MS.    Problem # 3:  HYPERLIPIDEMIA NEC/NOS (ICD-272.4) Restart Crestor. If sxs worsen then stop immediatly.  Recheck levels in 6 weeks. Slip given today.  His updated medication list for this problem includes:    Crestor 10 Mg Tabs (Rosuvastatin calcium) .Marland Kitchen... Take 1 tablet by mouth once a day at bedtime  Orders: T-Lipid  Profile [id number])  Complete Medication List: 1)  Cymbalta 60 Mg Cpep (Duloxetine hcl) .... Take one tablet by mouth once a day 2)  Lithium Carbonate 450 Mg Cr-tabs (Lithium carbonate) .... Take one tablet by mouht once ad ay 3)  Levoxyl 137 Mcg Tabs (Levothyroxine sodium) .... Take 1 tablet by mouth once a day 4)  Deplin 7.5 Mg Tabs (L-methylfolate) .... Take one tablt by mouth in the mornings 5)  Zyprexa 7.5 Mg Tabs (Olanzapine) .... Take one tablet by mouth at bedtime 6)  Neurontin 300 Mg Caps  (Gabapentin) .... Take one tablet by mouth four times a day 7)  Proair Hfa 108 (90 Base) Mcg/act Aers (Albuterol sulfate) .... 2-4 puff inhaled as needed for shortness of breath. 8)  Androgel Pump 1 % Gel (Testosterone) .... Apply 5 grams qam 9)  Crestor 10 Mg Tabs (Rosuvastatin calcium) .... Take 1 tablet by mouth once a day at bedtime  Patient Instructions: 1)  We will call with lab results.  Prescriptions: CRESTOR 10 MG TABS (ROSUVASTATIN CALCIUM) Take 1 tablet by mouth once a day at bedtime  #90 x 3   Entered and Authorized by:   Nani Gasser MD   Signed by:   Nani Gasser MD on 12/29/2009   Method used:   Printed then faxed to ...       [facility] (mail-order)             , [place]         Ph:        Fax: [phone]   RxID:   [id number]

## 2010-06-30 NOTE — Letter (Signed)
Summary: [facility]  [facility]   Imported By: Lanelle Bal 12/04/2009 13:52:45  _____________________________________________________________________  External Attachment:    Type:   Image     Comment:   External Document

## 2010-06-30 NOTE — Assessment & Plan Note (Signed)
Summary: leg pain and weakness- [facility] pt. - jr   Vital Signs:  Patient profile:   55 year old male Weight:      191 pounds BMI:     24.61 O2 Sat:      98 % on Room air Temp:     98.0 degrees F oral Pulse rate:   89 / minute Pulse rhythm:   regular Resp:     20 per minute BP sitting:   124 / 70  (right arm) Cuff size:   large  Vitals Entered By: Glendell Docker CMA (December 03, 2009 9:57 AM)  O2 Flow:  Room air CC: Rm 5 - Bilateral pain and leg weakness Comments c/o unresolved bilateral leg and right  pain and weakness for the past 2 months   Primary Care Provider:  Linford Arnold, C  CC:  Rm 5 - Bilateral pain and leg weakness.  History of Present Illness: Mr Banks is a 55 year old male who presents today for follow up of his muscle pain, and weakness in his legs.  Notes + stumbling, near falling.  Has to lay in the bed at the end of the night- can't lay down.  Unable to complete his job functions.  Crestor was stopped on May 11th.  Thighs are wasting- notes muscles aresoft with poor tone.  He has been following with Dr. Hale Bogus of Neurology and has undergone an extensive work up.  He has a follow up scheduled with neuro in 2 months.  Preventive Screening-Counseling & Management  Alcohol-Tobacco     Smoking Status: current  Allergies: 1)  Sulfa  Past History:  Past Medical History: Last updated: 04/13/2007 Hx of occ prostate pain.    Past Surgical History: Last updated: 04/02/2008 Deep brain stimular for tremor  01-30-08  Family History: Last updated: 04/13/2007 Father with stroke.  Sister wtih BrCa.  Social History: Last updated: 04/02/2008 Works at [employer] on [address] and then part time in evenings at [employer].  Married to [employer]. No kids. 5 dogs.   Current Smoker.    Alcohol use-yes Drug use-no Regular exercise-no  Risk Factors: Alcohol Use: 4+ (04/11/2006) Caffeine Use: 1-2 (04/11/2006) Exercise: no (04/11/2006)  Risk Factors: Smoking  Status: current (12/03/2009) Packs/Day: 1 (08/10/2006) Passive Smoke Exposure: no (04/13/2007)  Review of Systems       see HPI  Physical Exam  General:  Well-developed,well-nourished,in no acute distress; alert,appropriate and cooperative throughout examination Head:  Normocephalic and atraumatic without obvious abnormalities. No apparent alopecia or balding. Lungs:  Normal respiratory effort, chest expands symmetrically. Lungs are clear to auscultation, no crackles or wheezes. Heart:  Normal rate and regular rhythm. S1 and S2 normal without gallop, murmur, click, rub or other extra sounds. Neurologic:   cranial nerves II-XII intact, RUE hyperreflexia, RLE hyperreflexia, LUE hyperreflexia, and LLE hyperreflexia.  Bilateral UE 4-5/5 strength bilateral LE 4-5/5 strength   Impression & Recommendations:  Problem # 1:  MUSCLE WEAKNESS (GENERALIZED) (ICD-728.87) Assessment Deteriorated Case was discussed with Dr.  Darryll Capers,  he recommended that I review case with patient's neurologist and intitiate physical therapy.  Case was discussed with Dr. Hale Bogus (Neurology) at length.  He reports that imaging of spine demonstrates no compressive lesion of the spinal cord.  CSF dis note increased spinal protein, but no evidence for infection and all indexes neg for MS.  He does not believe that his symptoms are related to his DBS.  Also noted that he had a CT of the chest  which was negative for thymoma and negative for small cell lung CA- thus ruling out para neaplastic syndrome.  He reports that suspicion for myasthenia gravis is low due to patient's hyperactive reflexes and fact that myelopathy is not usually seen in [facility].  His recommendation at this point is that he see the patient back in the office in 2 months for a follow up exam so that he can have a comparative exam.  He plans to have patient evaluated by his partner Dr. Venia Minks at that time who is ther former head of the MS Center at [facility].   He maintains that primary lateral sclerosis remains in the differential which is a variant of MS- however increased spinal cord protein would be unusual.   In the meantime, will check HIV, RPR today and refer for PT. Orders: T-HIV-1 (Screen) [id number]) T-RPR (Syphilis) [id number])  Complete Medication List: 1)  Cymbalta 60 Mg Cpep (Duloxetine hcl) .... Take one tablet by mouth once a day 2)  Lithium Carbonate 450 Mg Cr-tabs (Lithium carbonate) .... Take one tablet by mouht once ad ay 3)  Levoxyl 137 Mcg Tabs (Levothyroxine sodium) .... Take 1 tablet by mouth once a day 4)  Deplin 7.5 Mg Tabs (L-methylfolate) .... Take one tablt by mouth in the mornings 5)  Zyprexa 7.5 Mg Tabs (Olanzapine) .... Take one tablet by mouth at bedtime 6)  Neurontin 300 Mg Caps (Gabapentin) .... Take one tablet by mouth four times a day 7)  Proair Hfa 108 (90 Base) Mcg/act Aers (Albuterol sulfate) .... 2-4 puff inhaled as needed for shortness of breath. 8)  Androgel Pump 1 % Gel (Testosterone) .... Apply 5 grams qam  Patient Instructions: 1)  You will be contacted about your physical therapy referral. 2)  Please follow up in 1 month.  Current Allergies (reviewed today): SULFA

## 2010-06-30 NOTE — Medication Information (Signed)
Summary: Prior Authorization & Approval for [facility]  Prior Authorization & Approval for [facility]   Imported By: Lanelle Bal 10/23/2009 12:51:50  _____________________________________________________________________  External Attachment:    Type:   Image     Comment:   External Document

## 2010-06-30 NOTE — Miscellaneous (Signed)
Summary: PT Discharge/[facility]  PT Discharge/[facility]   Imported By: Lanelle Bal 02/17/2010 13:12:58  _____________________________________________________________________  External Attachment:    Type:   Image     Comment:   External Document

## 2010-06-30 NOTE — Progress Notes (Signed)
Summary: referral for PT  Phone Note Call from Patient   Caller: Patient Call For: [facility] Summary of Call: Need referral put in for physical therapy. Pt said you had discussed at last visit but has not heard anything from our office on the appt. looked and you forgot to put the referral in Initial call taken by: Kathlene November,  December 17, 2009 4:39 PM

## 2010-06-30 NOTE — Letter (Signed)
   [facility] [address] [place], [place]  [address]  [place] Phone: [phone]      November 10, 2009   Eddie Hernandez [address] [place], [place] [address]  RE:  LAB RESULTS  Dear  Eddie Hernandez,  The following is an interpretation of your most recent lab tests.  Please take note of any instructions provided or changes to medications that have resulted from your lab work.    The blood work that you completed last week was normal.  Please follow up with Dr. Hale Bogus.  You should be contacted about your appointment.   Sincerely Yours,    Lemont Fillers FNP

## 2010-06-30 NOTE — Miscellaneous (Signed)
Summary: Lower Extremity Eval/[facility]  Lower Extremity Eval/[facility]   Imported By: Lanelle Bal 02/10/2010 11:06:22  _____________________________________________________________________  External Attachment:    Type:   Image     Comment:   External Document

## 2010-06-30 NOTE — Miscellaneous (Signed)
Summary: Directives and Consents  Directives and Consents   Imported By: Joanne Chars CMA 08/31/2009 15:19:55  _____________________________________________________________________  External Attachment:    Type:   Image     Comment:   External Document

## 2010-07-02 NOTE — Assessment & Plan Note (Signed)
Summary: Sinsutis/Bronchitis   Vital Signs:  Patient profile:   54 year old male Height:      74 inches Weight:      192 pounds Temp:     98.4 degrees F oral Pulse rate:   78 / minute BP sitting:   127 / 81  (right arm) Cuff size:   regular  Vitals Entered By: Avon Gully CMA, (AAMA) (June 09, 2010 10:48 AM) CC: cough x 2 weeks,   Primary Care Provider:  Linford Arnold, C  CC:  cough x 2 weeks and .  History of Present Illness: Sinus congestion for 2 weeks. Then developede into a cough that is wet and clear sputum.  Using OTC cough meds. Started with ST, better now. No fever. No GI sxs. Ears full feeling. Wheezing whey lays flat.  Couging fits at night.   Current Medications (verified): 1)  Cymbalta 60 Mg Cpep (Duloxetine Hcl) .... Take One Tablet By Mouth Once A Day 2)  Lithium Carbonate 450 Mg Cr-Tabs (Lithium Carbonate) .... Take One Tablet By Mouht Once Ad Ay 3)  Levoxyl 137 Mcg Tabs (Levothyroxine Sodium) .... Take 1 Tablet By Mouth Once A Day 4)  Deplin 7.5 Mg Tabs (L-Methylfolate) .... Take One Tablt By Mouth in The Mornings 5)  Zyprexa 7.5 Mg Tabs (Olanzapine) .... Take One Tablet By Mouth At Bedtime 6)  Neurontin 300 Mg Caps (Gabapentin) .... Take One Tablet By Mouth Four Times A Day 7)  Proair Hfa 108 (90 Base) Mcg/act Aers (Albuterol Sulfate) .... 2-4 Puff Inhaled As Needed For Shortness of Breath. 8)  Crestor 10 Mg Tabs (Rosuvastatin Calcium) .... Take 1 Tablet By Mouth Once A Day At Bedtime  Allergies (verified): 1)  Sulfa  Comments:  Nurse/Medical Assistant: The patient's medications and allergies were reviewed with the patient and were updated in the Medication and Allergy Lists. Avon Gully CMA, Duncan Dull) (June 09, 2010 10:48 AM)  Physical Exam  General:  Well-developed,well-nourished,in no acute distress; alert,appropriate and cooperative throughout examination Head:  Normocephalic and atraumatic without obvious abnormalities. No apparent alopecia  or balding. Eyes:  No corneal or conjunctival inflammation noted. EOMI. Perrla.  Ears:  External ear exam shows no significant lesions or deformities.  Otoscopic examination reveals clear canals, tympanic membranes are intact bilaterally without bulging, retraction, inflammation or discharge. Hearing is grossly normal bilaterally. Nose:  External nasal examination shows no deformity or inflammation. Mouth:  Oral mucosa and oropharynx without lesions or exudates.  Teeth in good repair. Neck:  No deformities, masses, or tenderness noted. Lungs:  Normal respiratory effort, chest expands symmetrically.Coarse BS with rhonchi at the bases.   Heart:  Normal rate and regular rhythm. S1 and S2 normal without gallop, murmur, click, rub or other extra sounds. Skin:  no rashes.   Cervical Nodes:  No lymphadenopathy noted Psych:  Cognition and judgment appear intact. Alert and cooperative with normal attention span and concentration. No apparent delusions, illusions, hallucinations   Impression & Recommendations:  Problem # 1:  SINUSITIS - ACUTE-NOS (ICD-461.9) Snsusits and bronchitis. Can use his inhaler as well.  His updated medication list for this problem includes:    Amoxicillin 875 Mg Tabs (Amoxicillin) .Marland Kitchen... Take 1 tablet by mouth two times a day for 10 days  Instructed on treatment. Call if symptoms persist or worsen.  Complete ABX. Call if not better in one week. Sampls of cough med given at night.   Complete Medication List: 1)  Cymbalta 60 Mg Cpep (Duloxetine hcl) .... Take one  tablet by mouth once a day 2)  Lithium Carbonate 450 Mg Cr-tabs (Lithium carbonate) .... Take one tablet by mouht once ad ay 3)  Levoxyl 137 Mcg Tabs (Levothyroxine sodium) .... Take 1 tablet by mouth once a day 4)  Deplin 7.5 Mg Tabs (L-methylfolate) .... Take one tablt by mouth in the mornings 5)  Zyprexa 7.5 Mg Tabs (Olanzapine) .... Take one tablet by mouth at bedtime 6)  Neurontin 300 Mg Caps (Gabapentin) ....  Take one tablet by mouth four times a day 7)  Proair Hfa 108 (90 Base) Mcg/act Aers (Albuterol sulfate) .... 2-4 puff inhaled as needed for shortness of breath. 8)  Crestor 10 Mg Tabs (Rosuvastatin calcium) .... Take 1 tablet by mouth once a day at bedtime 9)  Amoxicillin 875 Mg Tabs (Amoxicillin) .... Take 1 tablet by mouth two times a day for 10 days  Patient Instructions: 1)  Call if not better in one week.  Prescriptions: AMOXICILLIN 875 MG TABS (AMOXICILLIN) Take 1 tablet by mouth two times a day for 10 days  #20 x 0   Entered and Authorized by:   Nani Gasser MD   Signed by:   Nani Gasser MD on 06/09/2010   Method used:   Electronically to        [facility]* (retail)       [address] [place], [place]  [address]       Ph: [phone]       Fax: [phone]   RxID:   [id number]    Orders Added: 1)  Est. Patient Level III [84132]

## 2010-07-02 NOTE — Assessment & Plan Note (Signed)
Summary: Sinusiti/Bronchitis worse   Vital Signs:  Patient profile:   55 year old male Height:      74 inches Weight:      189 pounds Temp:     98.2 degrees F oral Pulse rate:   76 / minute BP sitting:   120 / 81  (right arm) Cuff size:   regular  Vitals Entered By: Avon Gully CMA, Duncan Dull) (June 16, 2010 10:51 AM) CC: ongoing cough, not feeling any better from last visit   Primary Care Provider:  Linford Arnold, C  CC:  ongoing cough and not feeling any better from last visit.  History of Present Illness: Feels worse. NOw feels like getting bodyaches. NO fever. Has been on the amoxi for 7 days. Taking the robitussin cold formula.  Still having productive cough and sinus symptoms. Cough is worse at bedtime. Some wheezing and SOB as well.      Current Medications (verified): 1)  Cymbalta 60 Mg Cpep (Duloxetine Hcl) .... Take One Tablet By Mouth Once A Day 2)  Lithium Carbonate 450 Mg Cr-Tabs (Lithium Carbonate) .... Take One Tablet By Mouht Once Ad Ay 3)  Levoxyl 137 Mcg Tabs (Levothyroxine Sodium) .... Take 1 Tablet By Mouth Once A Day 4)  Deplin 7.5 Mg Tabs (L-Methylfolate) .... Take One Tablt By Mouth in The Mornings 5)  Zyprexa 7.5 Mg Tabs (Olanzapine) .... Take One Tablet By Mouth At Bedtime 6)  Neurontin 300 Mg Caps (Gabapentin) .... Take One Tablet By Mouth Four Times A Day 7)  Proair Hfa 108 (90 Base) Mcg/act Aers (Albuterol Sulfate) .... 2-4 Puff Inhaled As Needed For Shortness of Breath. 8)  Crestor 10 Mg Tabs (Rosuvastatin Calcium) .... Take 1 Tablet By Mouth Once A Day At Bedtime  Allergies (verified): 1)  Sulfa  Comments:  Nurse/Medical Assistant: The patient's medications and allergies were reviewed with the patient and were updated in the Medication and Allergy Lists. Avon Gully CMA, Duncan Dull) (June 16, 2010 10:52 AM)  Past History:  Past Medical History: Hx of occ prostate pain.   Hx of alcohol abuse   Social History: Reviewed history from  04/02/2008 and no changes required. Works at [employer] on [address] and then part time in evenings at [employer].  Married to [employer]. No kids. 5 dogs.   Current Smoker.    Alcohol use-yes Drug use-no Regular exercise-no  Physical Exam  General:  Well-developed,well-nourished,in no acute distress; alert,appropriate and cooperative throughout examination Head:  Normocephalic and atraumatic without obvious abnormalities. No apparent alopecia or balding. No sinus tenderness Eyes:  No corneal or conjunctival inflammation noted. EOMI. Perrla. Ears:  External ear exam shows no significant lesions or deformities.  Otoscopic examination reveals clear canals, tympanic membranes are intact bilaterally without bulging, retraction, inflammation or discharge. Hearing is grossly normal bilaterally. Nose:  External nasal examination shows no deformity or inflammation.  Mouth:  Oral mucosa and oropharynx without lesions or exudates.  Teeth in good repair. Neck:  No deformities, masses, or tenderness noted. Lungs:  Coarse BS wtih prolonged expiration. NO wheezing or crackles.  Heart:  Normal rate and regular rhythm. S1 and S2 normal without gallop, murmur, click, rub or other extra sounds. Skin:  no rashes.   Cervical Nodes:  No lymphadenopathy noted Psych:  Cognition and judgment appear intact. Alert and cooperative with normal attention span and concentration. No apparent delusions, illusions, hallucinations   Impression & Recommendations:  Problem # 1:  SINUSITIS - ACUTE-NOS (ICD-461.9) Sinusitis/bronchitis. Not improving. gEtting worse. Will  change to a FQ. He is still smoking and discussed techniques for cessation.  If not better in one week consider CXR.  The following medications were removed from the medication list:    Amoxicillin 875 Mg Tabs (Amoxicillin) .Marland Kitchen... Take 1 tablet by mouth two times a day for 10 days His updated medication list for this problem includes:    Levofloxacin  500 Mg Tabs (Levofloxacin) .Marland Kitchen... Take 1 tablet by mouth once a day for 7 days  Instructed on treatment. Call if symptoms persist or worsen.   Problem # 2:  TOBACCO ABUSE (ICD-305.1) He has tried wellbutrin in the past and nicotine replacement and feels they were not helpgul. Also tried cutting down and that didnt work. He is not a good candidate for Chantix because of his psych hx.  Discussed retrying the nicotin replacement and possible using the patch along with the gum. Also discussed using 1-800-QUIT-NOW in conjunction.    Problem # 3:  ACUTE BRONCHITIS (ICD-466.0) Sinusitis/bronchitis. Not improving. gEtting worse. Will change to a FQ. He is still smoking and discussed techniques for cessation.  If not better in one week consider CXR.  Instructed on treatment. Call if symptoms persist or worsen.   The following medications were removed from the medication list:    Amoxicillin 875 Mg Tabs (Amoxicillin) .Marland Kitchen... Take 1 tablet by mouth two times a day for 10 days His updated medication list for this problem includes:    Proair Hfa 108 (90 Base) Mcg/act Aers (Albuterol sulfate) .Marland Kitchen... 2-4 puff inhaled as needed for shortness of breath.    Levofloxacin 500 Mg Tabs (Levofloxacin) .Marland Kitchen... Take 1 tablet by mouth once a day for 7 days  Complete Medication List: 1)  Cymbalta 60 Mg Cpep (Duloxetine hcl) .... Take one tablet by mouth once a day 2)  Lithium Carbonate 450 Mg Cr-tabs (Lithium carbonate) .... Take one tablet by mouht once ad ay 3)  Levoxyl 137 Mcg Tabs (Levothyroxine sodium) .... Take 1 tablet by mouth once a day 4)  Deplin 7.5 Mg Tabs (L-methylfolate) .... Take one tablt by mouth in the mornings 5)  Zyprexa 7.5 Mg Tabs (Olanzapine) .... Take one tablet by mouth at bedtime 6)  Neurontin 300 Mg Caps (Gabapentin) .... Take one tablet by mouth four times a day 7)  Proair Hfa 108 (90 Base) Mcg/act Aers (Albuterol sulfate) .... 2-4 puff inhaled as needed for shortness of breath. 8)  Crestor 10  Mg Tabs (Rosuvastatin calcium) .... Take 1 tablet by mouth once a day at bedtime 9)  Levofloxacin 500 Mg Tabs (Levofloxacin) .... Take 1 tablet by mouth once a day for 7 days  Patient Instructions: 1)  Call if not better in one week. Will get a CXR if not better at that time.  Prescriptions: LEVOFLOXACIN 500 MG TABS (LEVOFLOXACIN) Take 1 tablet by mouth once a day for 7 days  #7 x 0   Entered and Authorized by:   Nani Gasser MD   Signed by:   Nani Gasser MD on 06/16/2010   Method used:   Electronically to        [facility]* (retail)       [address] [place], [place]  [address]       Ph: [phone]       Fax: [phone]   RxID:   [id number]    Orders Added: 1)  Est. Patient Level IV [84132]

## 2010-07-28 NOTE — Progress Notes (Signed)
Summary: Medication List provided by patient  Medication List provided by patient   Imported By: Maryln Gottron 07/20/2010 14:18:36  _____________________________________________________________________  External Attachment:    Type:   Image     Comment:   External Document

## 2010-07-30 ENCOUNTER — Encounter: Payer: Self-pay | Admitting: Family Medicine

## 2010-08-18 NOTE — Letter (Signed)
Summary: [facility]  [facility]   Imported By: Maryln Gottron 08/11/2010 08:45:03  _____________________________________________________________________  External Attachment:    Type:   Image     Comment:   External Document

## 2010-12-04 ENCOUNTER — Ambulatory Visit
Admission: RE | Admit: 2010-12-04 | Discharge: 2010-12-04 | Disposition: A | Discharge status: Home / Self Care | Payer: Managed Care, Other (non HMO) | Source: Ambulatory Visit | Attending: Family Medicine | Admitting: Family Medicine

## 2010-12-04 ENCOUNTER — Encounter: Payer: Self-pay | Admitting: Family Medicine

## 2010-12-04 ENCOUNTER — Ambulatory Visit (INDEPENDENT_AMBULATORY_CARE_PROVIDER_SITE_OTHER): Payer: Managed Care, Other (non HMO) | Admitting: Family Medicine

## 2010-12-04 ENCOUNTER — Telehealth: Payer: Self-pay | Admitting: Family Medicine

## 2010-12-04 VITALS — BP 121/79 | HR 87 | Temp 98.8°F | Ht 72.0 in | Wt 192.0 lb

## 2010-12-04 DIAGNOSIS — M722 Plantar fascial fibromatosis: Secondary | ICD-10-CM

## 2010-12-04 MED ORDER — ETODOLAC 500 MG PO TABS: 500.0000 mg | ORAL_TABLET | Freq: Two times a day (BID) | ORAL | Status: DC

## 2010-12-04 MED ORDER — LEVOTHYROXINE SODIUM 137 MCG PO TABS: 137.0000 ug | ORAL_TABLET | Freq: Every day | ORAL | Status: DC

## 2010-12-04 MED ORDER — ROSUVASTATIN CALCIUM 10 MG PO TABS: 10.0000 mg | ORAL_TABLET | Freq: Every day | ORAL | Status: DC

## 2010-12-04 NOTE — Assessment & Plan Note (Signed)
Hx/ Exam/ Xray all c/w with diagnosis of plantar fasciitis with a secondary heel spur on xray today.  Given pt h/o on home stretches for plantar fasciitis.  Added Etodolac to use for pain/ inflammation with breakfast and dinner prn.  Ice masage and proper footwear may also help.   Due to the nature of his job, he is interested in treatment with injection.  Referral made to Dr Yates Decamp.

## 2010-12-04 NOTE — Telephone Encounter (Signed)
Pls let pt know that his XRAY is + for a heel spur.  Continue current treatment plan.  Fax copy of results to Dr Yates Decamp for his upcoming appt.

## 2010-12-04 NOTE — Patient Instructions (Signed)
Podiatry referral made for Dr Yates Decamp for plantar fasciitis.  Start home stretches, ice massage, avoid barefoot walking.  Use Etodolac with breakfast and dinner as needed for pain.

## 2010-12-04 NOTE — Progress Notes (Signed)
  Subjective:    Patient ID: Eddie Hernandez, male    DOB: 1956/01/26, 55 y.o.   MRN: [id number]  HPI 55 yo WM presents for L foot pain for months that has gotten worse since he has been standing more for his new job.  He now is getting some R foot pain.  He is limping by the end of his shift.  He is wearing a dansko shoes but it has not helped.  He has pain right in the heel.  No hx of plantar fascitis. Denies swelling, redness, trauma or bruising.   BP 121/79  Pulse 87  Temp(Src) 98.8 F (37.1 C) (Oral)  Ht 6' (1.829 m)  Wt 192 lb (87.091 kg)  BMI 26.04 kg/m2  SpO2 98%   Review of Systems  Musculoskeletal: Positive for gait problem.       Objective:   Physical Exam  Constitutional: He appears well-developed and well-nourished.  Cardiovascular:       Pedal pulses + 2 bilat  Musculoskeletal:       Left foot: He exhibits tenderness (tender over L heel and over plantar fascia esp in the dorsiflexed position). He exhibits normal range of motion.          Assessment & Plan:

## 2010-12-07 ENCOUNTER — Ambulatory Visit: Payer: Self-pay | Admitting: Family Medicine

## 2010-12-09 ENCOUNTER — Other Ambulatory Visit: Payer: Self-pay | Admitting: Family Medicine

## 2010-12-10 ENCOUNTER — Other Ambulatory Visit: Payer: Self-pay | Admitting: Family Medicine

## 2010-12-10 MED ORDER — ROSUVASTATIN CALCIUM 10 MG PO TABS: 10.0000 mg | ORAL_TABLET | Freq: Every day | ORAL | Status: DC

## 2010-12-10 MED ORDER — LEVOTHYROXINE SODIUM 137 MCG PO TABS: 137.0000 ug | ORAL_TABLET | Freq: Every day | ORAL | Status: DC

## 2010-12-11 ENCOUNTER — Other Ambulatory Visit: Payer: Self-pay | Admitting: Family Medicine

## 2010-12-11 MED ORDER — ROSUVASTATIN CALCIUM 10 MG PO TABS: 10.0000 mg | ORAL_TABLET | Freq: Every day | ORAL | Status: DC

## 2010-12-11 MED ORDER — LEVOTHYROXINE SODIUM 137 MCG PO TABS: 137.0000 ug | ORAL_TABLET | Freq: Every day | ORAL | Status: DC

## 2010-12-11 NOTE — Telephone Encounter (Signed)
Pt called and said just send his scripts for Levoxyl and crestor to [facility] in [place]. Sent a 30 day supply of each and pt was sched an appt since needed. Eddie Newcomer, LPN [name]

## 2010-12-19 ENCOUNTER — Encounter: Payer: Self-pay | Admitting: Family Medicine

## 2010-12-21 ENCOUNTER — Telehealth: Payer: Self-pay | Admitting: Family Medicine

## 2010-12-21 NOTE — Telephone Encounter (Signed)
Closed

## 2010-12-22 ENCOUNTER — Ambulatory Visit (INDEPENDENT_AMBULATORY_CARE_PROVIDER_SITE_OTHER): Payer: Managed Care, Other (non HMO) | Admitting: Family Medicine

## 2010-12-22 ENCOUNTER — Encounter: Payer: Self-pay | Admitting: Family Medicine

## 2010-12-22 DIAGNOSIS — J449 Chronic obstructive pulmonary disease, unspecified: Secondary | ICD-10-CM

## 2010-12-22 DIAGNOSIS — J4489 Other specified chronic obstructive pulmonary disease: Secondary | ICD-10-CM

## 2010-12-22 DIAGNOSIS — E785 Hyperlipidemia, unspecified: Secondary | ICD-10-CM

## 2010-12-22 DIAGNOSIS — E039 Hypothyroidism, unspecified: Secondary | ICD-10-CM

## 2010-12-22 DIAGNOSIS — H43819 Vitreous degeneration, unspecified eye: Secondary | ICD-10-CM

## 2010-12-22 LAB — LIPID PANEL
HDL: 38 mg/dL — ABNORMAL LOW (ref >39)
LDL Cholesterol: 40 mg/dL (ref 0–99)

## 2010-12-22 MED ORDER — NABUMETONE 750 MG PO TABS: 750.0000 mg | ORAL_TABLET | Freq: Two times a day (BID) | ORAL | Status: AC

## 2010-12-22 NOTE — Assessment & Plan Note (Signed)
Stable. nO recent flares.

## 2010-12-22 NOTE — Assessment & Plan Note (Signed)
Due to check thyroid levels.

## 2010-12-22 NOTE — Progress Notes (Signed)
  Subjective:    Patient ID: Eddie Hernandez, male    DOB: 1956-05-05, 55 y.o.   MRN: [id number]  HPI  Here to f/u on thyroid and lipids  He denies any skin or hair changes. Has lost some weight recently because has been working on his feet at [employer]. Has heal spurs.  Has been taking NSAIDs but has been bothering his stomach.   COPD - Has been stable this summer. No flares. No recent SOB or respiratory infections  He was dx with heal spur since the last i saw him. Had injection with Dr. Yates Decamp and has f/u in about 1 weeks. Say the NSAID he is taking is causing GI irritation. Thought he also has prior hx of GERD and drinks enormous amts of caffeine. Doesn't always eat consistantly with his job.  Has last some weight because of this.   Recently dx with vitreous detachment in the left eye.  Following with ophtho now.    Due for lipid levels. Has been over 12 months..  Review of Systems     Objective:   Physical Exam  Constitutional: He is oriented to person, place, and time. He appears well-developed and well-nourished.  HENT:  Head: Normocephalic and atraumatic.  Neck: Neck supple. No thyromegaly present.  Cardiovascular: Normal rate, regular rhythm and normal heart sounds.   Pulmonary/Chest: Effort normal and breath sounds normal.  Lymphadenopathy:    He has no cervical adenopathy.  Neurological: He is alert and oriented to person, place, and time.  Skin: Skin is warm and dry.  Psychiatric: He has a normal mood and affect.          Assessment & Plan:  Heel spur- will change his antiinflammatory and gave him samples of dexilant to stat to avoid getting a GI ulcer. Also recommend dec caffeine intake.  F/U with podiatry.

## 2010-12-22 NOTE — Patient Instructions (Signed)
Take the dexilant once a day while using anti-inflammatories.  We will call you with your lab work.

## 2010-12-22 NOTE — Assessment & Plan Note (Signed)
Due to recheck lipids and adjust as needed.

## 2010-12-23 ENCOUNTER — Telehealth: Payer: Self-pay | Admitting: Family Medicine

## 2010-12-23 LAB — COMPLETE METABOLIC PANEL WITH GFR
ALT: 13 U/L (ref 0–53)
Albumin: 4.6 g/dL (ref 3.5–5.2)
CO2: 27 mEq/L (ref 19–32)
Chloride: 106 mEq/L (ref 96–112)
GFR, Est African American: >60 mL/min (ref >60)
Potassium: 4.7 mEq/L (ref 3.5–5.3)
Sodium: 139 mEq/L (ref 135–145)
Total Bilirubin: 0.4 mg/dL (ref 0.3–1.2)
Total Protein: 6.6 g/dL (ref 6.0–8.3)

## 2010-12-23 NOTE — Telephone Encounter (Signed)
Left message on pt.'s vm.

## 2010-12-23 NOTE — Telephone Encounter (Signed)
Call patient: Cholesterol ooks better. Triglycerides are down to 161 which is almost at goal of less than 150. Blood sugar still elevated but stable. Thyroid looks great. Recheck sugar in 6 months.

## 2011-01-13 ENCOUNTER — Other Ambulatory Visit: Payer: Self-pay | Admitting: Family Medicine

## 2011-01-13 MED ORDER — LEVOTHYROXINE SODIUM 137 MCG PO TABS: 137.0000 ug | ORAL_TABLET | Freq: Every day | ORAL | Status: DC

## 2011-01-13 MED ORDER — ROSUVASTATIN CALCIUM 10 MG PO TABS: 10.0000 mg | ORAL_TABLET | Freq: Every day | ORAL | Status: DC

## 2011-01-13 NOTE — Telephone Encounter (Signed)
Pt called and needed refills of his crestor and levoxyl medications.  Had recent O.V. And labwork.  Plan:  Sent 6 mth worth of refills to [facility] [place] and pt aware. Jarvis Newcomer, LPN [name]

## 2011-04-21 ENCOUNTER — Ambulatory Visit (INDEPENDENT_AMBULATORY_CARE_PROVIDER_SITE_OTHER): Payer: Managed Care, Other (non HMO) | Admitting: Family Medicine

## 2011-04-21 ENCOUNTER — Encounter: Payer: Self-pay | Admitting: Family Medicine

## 2011-04-21 DIAGNOSIS — R634 Abnormal weight loss: Secondary | ICD-10-CM

## 2011-04-21 DIAGNOSIS — R5381 Other malaise: Secondary | ICD-10-CM

## 2011-04-21 DIAGNOSIS — E291 Testicular hypofunction: Secondary | ICD-10-CM

## 2011-04-21 DIAGNOSIS — R197 Diarrhea, unspecified: Secondary | ICD-10-CM

## 2011-04-21 DIAGNOSIS — R5383 Other fatigue: Secondary | ICD-10-CM

## 2011-04-21 MED ORDER — TESTOSTERONE 20.25 MG/ACT (1.62%) TD GEL: 2.0000 | TRANSDERMAL | Status: DC

## 2011-04-21 NOTE — Patient Instructions (Signed)
Can try one of the pro-biotics for his bowels as well.

## 2011-04-21 NOTE — Progress Notes (Signed)
Subjective:    Patient ID: Eddie Hernandez, male    DOB: May 20, 1956, 56 y.o.   MRN: [id number]  HPI Feels extremely fatigued.  He is interested in restaring his testosteone level.  Sleeping a lot.  One of his other physicians mentioned restarting his testosterone as he thought that that would be helpful. 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  Change in is bowels.  Will have frequent diarrhea and hs had some accidents. This seems to be intermittent with constipation. He has very few regular bowel movements. Has had for months. No recent AB exposure. No blood in the stool.  Has a new job and is on his fet all the time.  He recently stopped drinking Dr. Reino Kent. Has been trying to eat shreded wheat.  No abdominal pain or cramping. Colonoscopy is up to date.  Hasn't tried any probiotics. He has also lost some weight. He says he has been eating regularly. He still drinks a significant amount of caffeine.   Review of Systems     BP 130/81  Pulse 67  Wt 181 lb (82.101 kg)    Allergies  Allergen Reactions  . Sulfonamide Derivatives     REACTION: Rash    Past Medical History  Diagnosis Date  . Prostate pain   . Alcohol abuse     Past Surgical History  Procedure Date  . Deep brain stimulator placement 01-30-08    tremors    History   Social History  . Marital Status: Married    Spouse Name: N/A    Number of Children: N/A  . Years of Education: N/A   Occupational History  . Not on file.   Social History Main Topics  . Smoking status: Current Everyday Smoker -- 1.0 packs/day    Types: Cigarettes  . Smokeless tobacco: Not on file  . Alcohol Use: Yes  . Drug Use: No  . Sexually Active: Not on file   Other Topics Concern  . Not on file   Social History Narrative  . No narrative on file    Family History  Problem Relation Age of Onset  . Stroke Father   . Cancer Sister     breast    Objective:   Physical Exam  Constitutional: He is oriented to person,  place, and time. He appears well-developed and well-nourished.  HENT:  Head: Normocephalic and atraumatic.  Cardiovascular: Normal rate, regular rhythm and normal heart sounds.   Pulmonary/Chest: Effort normal and breath sounds normal.  Abdominal: Soft. Bowel sounds are normal. He exhibits no distension and no mass. There is no tenderness. There is no rebound and no guarding.  Neurological: He is alert and oriented to person, place, and time.  Skin: Skin is warm and dry.  Psychiatric: He has a normal mood and affect. His behavior is normal.          Assessment & Plan:  Fatigue- Check for anenmia. Maybe from his diarrhea. Will restart his test as well and f/u in 2 months ot see if this helps or not. I will check his baseline PSA and liver functions and testosterone before we start the AndroGel.  Weight Loss - colonoscopy is up to date.  I think this is likely from his frequent diarrhea.  Diarrhea - Check c diff and stool culture. No known exposure to abx recently. Can try one of the pro-biotics for his bowels as well. We discussed decreasing his caffeine intake as well as this also  stimulate the bowels. I will also check a CBC to rule out other infectious causes. If we do not find a cause and the trial of probiotics has not improved his diarrhea then he will need to see GI again.

## 2011-04-22 LAB — COMPLETE METABOLIC PANEL WITH GFR
ALT: 25 U/L (ref 0–53)
CO2: 23 mEq/L (ref 19–32)
Creat: 0.89 mg/dL (ref 0.50–1.35)
GFR, Est African American: >89 mL/min
GFR, Est Non African American: >89 mL/min
Glucose, Bld: 116 mg/dL — ABNORMAL HIGH (ref 70–99)
Total Bilirubin: 0.4 mg/dL (ref 0.3–1.2)

## 2011-04-22 LAB — CBC
MCH: 32 pg (ref 26.0–34.0)
MCV: 94.1 fL (ref 78.0–100.0)
Platelets: 220 10*3/uL (ref 150–400)
RBC: 4.56 MIL/uL (ref 4.22–5.81)

## 2011-04-23 LAB — TESTOSTERONE, FREE, TOTAL, SHBG
Sex Hormone Binding: 27 nmol/L (ref 13–71)
Testosterone: 437.95 ng/dL (ref 250–890)

## 2011-04-30 LAB — STOOL CULTURE

## 2011-05-27 ENCOUNTER — Ambulatory Visit (INDEPENDENT_AMBULATORY_CARE_PROVIDER_SITE_OTHER): Payer: Managed Care, Other (non HMO) | Admitting: Family Medicine

## 2011-05-27 ENCOUNTER — Encounter: Payer: Self-pay | Admitting: Family Medicine

## 2011-05-27 VITALS — BP 134/81 | HR 78 | Ht 74.0 in | Wt 176.0 lb

## 2011-05-27 DIAGNOSIS — R7309 Other abnormal glucose: Secondary | ICD-10-CM

## 2011-05-27 DIAGNOSIS — R945 Abnormal results of liver function studies: Secondary | ICD-10-CM

## 2011-05-27 DIAGNOSIS — R7989 Other specified abnormal findings of blood chemistry: Secondary | ICD-10-CM

## 2011-05-27 DIAGNOSIS — S2239XA Fracture of one rib, unspecified side, initial encounter for closed fracture: Secondary | ICD-10-CM

## 2011-05-27 LAB — POCT GLYCOSYLATED HEMOGLOBIN (HGB A1C): Hemoglobin A1C: 5.5

## 2011-05-27 MED ORDER — HYDROCODONE-ACETAMINOPHEN 5-500 MG PO TABS
1.0000 | ORAL_TABLET | Freq: Four times a day (QID) | ORAL | Status: DC | PRN
Start: 2011-05-27 — End: 2011-06-07

## 2011-05-27 NOTE — Patient Instructions (Signed)
Call for repeat xray in about 3 weeks.

## 2011-05-27 NOTE — Progress Notes (Signed)
  Subjective:    Patient ID: Eddie Hernandez, male    DOB: 12-06-1955, 55 y.o.   MRN: [id number]  HPI  Dog pulled him and knocked him into the wall and the chair molding this right posterior rib.  #10 rib is fractured.  Had xray at [facility].  Told to f/u with PCP.  It is hard to breath. Has been taking deep breaths.  Rib clicks with deep breath. No other trauma. No head injury. No fever or cough.   Review of Systems     Objective:   Physical Exam  Constitutional: He is oriented to person, place, and time. He appears well-developed and well-nourished.  HENT:  Head: Normocephalic and atraumatic.  Cardiovascular: Normal rate, regular rhythm and normal heart sounds.   Pulmonary/Chest: Effort normal and breath sounds normal.  Musculoskeletal:       Tender over the posterior later 10th rib. No swelling or bruising.    Neurological: He is alert and oriented to person, place, and time.  Skin: Skin is warm and dry. No erythema.       No bruise.   Psychiatric: He has a normal mood and affect. His behavior is normal.          Assessment & Plan:  Rib fracture - Pt is doing well. I did refill his hydrocodone for one more week.  F.U for rpeat xray in 3-4 weeks to confirm healing.  He is wearing ACE wrap for comfrot and support. Call if develop fever or cough.consider osteoporosis. Will get report from [facility].   Abnormal glucose- Due for A1C today.  Will call with results .  Elevated liver enzymes - Stop alcohol and tylenol products and recheck in 1-2 weeks. Lab slip given.

## 2011-06-07 ENCOUNTER — Other Ambulatory Visit: Payer: Self-pay | Admitting: Family Medicine

## 2011-07-13 ENCOUNTER — Other Ambulatory Visit: Payer: Self-pay | Admitting: Family Medicine

## 2011-09-06 ENCOUNTER — Encounter: Payer: Self-pay | Admitting: Physician Assistant

## 2011-09-06 ENCOUNTER — Ambulatory Visit
Admission: RE | Admit: 2011-09-06 | Discharge: 2011-09-06 | Disposition: A | Discharge status: Home / Self Care | Payer: Worker's Compensation | Source: Ambulatory Visit | Attending: Physician Assistant | Admitting: Physician Assistant

## 2011-09-06 ENCOUNTER — Ambulatory Visit (INDEPENDENT_AMBULATORY_CARE_PROVIDER_SITE_OTHER): Payer: Worker's Compensation | Admitting: Physician Assistant

## 2011-09-06 VITALS — BP 139/92 | HR 86 | Ht 74.0 in | Wt 185.0 lb

## 2011-09-06 DIAGNOSIS — R0781 Pleurodynia: Secondary | ICD-10-CM

## 2011-09-06 DIAGNOSIS — R079 Chest pain, unspecified: Secondary | ICD-10-CM

## 2011-09-06 DIAGNOSIS — Z8781 Personal history of (healed) traumatic fracture: Secondary | ICD-10-CM

## 2011-09-06 MED ORDER — HYDROCODONE-ACETAMINOPHEN 5-500 MG PO TABS: 1.0000 | ORAL_TABLET | Freq: Three times a day (TID) | ORAL | Status: DC | PRN

## 2011-09-06 NOTE — Progress Notes (Signed)
  Subjective:    Patient ID: Eddie Hernandez, male    DOB: February 14, 1956, 56 y.o.   MRN: [id number]  HPI Patient presents to clinic with pain over his left ribs. He broke his right ribs in December from an injury. 2 days ago he tripped over his dogs and fell again and landed on his left side. He feels similar to how he felt in December. Pain is worse with deep breaths in and out. His left side is very tender to touch and any ROM aggravates it. He denies any shortness of breath but he just feels like his breaths are shallow due to being in so much pain. Pain characterized 6/10.   Review of Systems     Objective:   Physical Exam  Musculoskeletal:       Tenderness to palpation intercostally between ribs 10 and 11. I don't feel any step off with palpation of the rib. No bruising or swelling present.           Assessment & Plan:    Rib pain on left side- Will get xray and call with results. Refilled hydrocodone enough for 1-2 weeks. Encouraged patient to keep chest wrap for support. If fractured then will need to follow up in 4-6 weeks to make sure ribs are healing well. Consider ice pack on areas of the most pain and swelling. Need to consider osteoporosis will schedule bone density.

## 2011-09-06 NOTE — Patient Instructions (Signed)
Will schedule for bone denisity. Will call with results of X-ray. Will send over hydrocodone for pain.

## 2011-09-19 ENCOUNTER — Other Ambulatory Visit: Payer: Self-pay | Admitting: Family Medicine

## 2011-09-24 ENCOUNTER — Other Ambulatory Visit: Payer: Self-pay | Admitting: *Deleted

## 2011-09-24 MED ORDER — ROSUVASTATIN CALCIUM 10 MG PO TABS: 10.0000 mg | ORAL_TABLET | Freq: Every day | ORAL | Status: DC

## 2011-10-26 ENCOUNTER — Other Ambulatory Visit: Payer: Self-pay | Admitting: Family Medicine

## 2011-12-15 ENCOUNTER — Telehealth: Payer: Self-pay | Admitting: *Deleted

## 2011-12-15 DIAGNOSIS — E039 Hypothyroidism, unspecified: Secondary | ICD-10-CM

## 2011-12-15 NOTE — Telephone Encounter (Signed)
Pt called and wants labs order for TSH. I dont see where pt has been seen for this recently so pt advised we will send order but needs appt

## 2011-12-18 LAB — TSH: TSH: 10.344 u[IU]/mL — ABNORMAL HIGH (ref 0.350–4.500)

## 2011-12-20 ENCOUNTER — Other Ambulatory Visit: Payer: Self-pay | Admitting: *Deleted

## 2011-12-20 MED ORDER — ROSUVASTATIN CALCIUM 20 MG PO TABS: 20.0000 mg | ORAL_TABLET | Freq: Every day | ORAL | Status: DC

## 2012-03-17 ENCOUNTER — Other Ambulatory Visit: Payer: Self-pay | Admitting: *Deleted

## 2012-03-17 MED ORDER — LEVOTHYROXINE SODIUM 137 MCG PO TABS: 137.0000 ug | ORAL_TABLET | Freq: Every day | ORAL | Status: DC

## 2012-04-12 NOTE — Telephone Encounter (Signed)
See other message

## 2012-05-05 ENCOUNTER — Other Ambulatory Visit: Payer: Self-pay | Admitting: *Deleted

## 2012-05-08 ENCOUNTER — Other Ambulatory Visit: Payer: Self-pay | Admitting: *Deleted

## 2012-05-08 MED ORDER — LEVOTHYROXINE SODIUM 137 MCG PO TABS: 137.0000 ug | ORAL_TABLET | Freq: Every day | ORAL | Status: DC

## 2012-07-20 ENCOUNTER — Other Ambulatory Visit: Payer: Self-pay | Admitting: *Deleted

## 2012-08-10 ENCOUNTER — Ambulatory Visit (INDEPENDENT_AMBULATORY_CARE_PROVIDER_SITE_OTHER): Payer: Managed Care, Other (non HMO) | Admitting: Family Medicine

## 2012-08-10 ENCOUNTER — Encounter: Payer: Self-pay | Admitting: Family Medicine

## 2012-08-10 ENCOUNTER — Other Ambulatory Visit: Payer: Self-pay | Admitting: Family Medicine

## 2012-08-10 VITALS — BP 117/66 | HR 75 | Ht 72.0 in | Wt 188.0 lb

## 2012-08-10 DIAGNOSIS — H43813 Vitreous degeneration, bilateral: Secondary | ICD-10-CM | POA: Insufficient documentation

## 2012-08-10 DIAGNOSIS — E039 Hypothyroidism, unspecified: Secondary | ICD-10-CM

## 2012-08-10 DIAGNOSIS — Z0000 Encounter for general adult medical examination without abnormal findings: Secondary | ICD-10-CM

## 2012-08-10 DIAGNOSIS — G4700 Insomnia, unspecified: Secondary | ICD-10-CM

## 2012-08-10 DIAGNOSIS — H43819 Vitreous degeneration, unspecified eye: Secondary | ICD-10-CM

## 2012-08-10 LAB — COMPLETE METABOLIC PANEL WITH GFR
AST: 21 U/L (ref 0–37)
Albumin: 4.3 g/dL (ref 3.5–5.2)
Alkaline Phosphatase: 85 U/L (ref 39–117)
BUN: 9 mg/dL (ref 6–23)
GFR, Est Non African American: >89 mL/min
Glucose, Bld: 105 mg/dL — ABNORMAL HIGH (ref 70–99)
Potassium: 4.6 mEq/L (ref 3.5–5.3)
Sodium: 139 mEq/L (ref 135–145)
Total Bilirubin: 0.4 mg/dL (ref 0.3–1.2)

## 2012-08-10 LAB — LIPID PANEL
Cholesterol: 218 mg/dL — ABNORMAL HIGH (ref 0–200)
HDL: 32 mg/dL — ABNORMAL LOW (ref >39)
Total CHOL/HDL Ratio: 6.8 Ratio

## 2012-08-10 MED ORDER — ROSUVASTATIN CALCIUM 10 MG PO TABS: 10.0000 mg | ORAL_TABLET | Freq: Every day | ORAL | Status: DC

## 2012-08-10 MED ORDER — LITHIUM CARBONATE ER 450 MG PO TBCR: 450.0000 mg | EXTENDED_RELEASE_TABLET | Freq: Every day | ORAL | Status: DC

## 2012-08-10 NOTE — Progress Notes (Signed)
Subjective:    Patient ID: Eddie Hernandez, male    DOB: [birth year], 57 y.o.   MRN: [id number]  HPI Here for CPE today.    Not sleeping well. Can fall asleep around 10-11PM. Then waking up around 1AM most nights.  He says often times it takes several hours to fall back asleep. He typically has to get up around 7 or 8 AM to get to work. Then takes awhile to fall back asleep.  Recently increased cymbalta from 60mg  to 120mg .  Has had more consitpation on the higher dose.  Has started eating [employer] and eating some apples daily and that has helped.  He has been sober since October. No regular exercise but does have a physically active job. He recently had the battery changed on his deep brain stimulator.    Review of Systems     BP 117/66  Pulse 75  Ht 6' (1.829 m)  Wt 188 lb (85.276 kg)  BMI 25.49 kg/m2    Allergies  Allergen Reactions  . Sulfonamide Derivatives     REACTION: Rash    Past Medical History  Diagnosis Date  . Prostate pain   . Alcohol abuse     Past Surgical History  Procedure Laterality Date  . Deep brain stimulator placement  01-30-08    tremors    History   Social History  . Marital Status: Married    Spouse Name: N/A    Number of Children: N/A  . Years of Education: N/A   Occupational History  . Not on file.   Social History Main Topics  . Smoking status: Current Every Day Smoker -- 1.00 packs/day    Types: Cigarettes  . Smokeless tobacco: Not on file  . Alcohol Use: Yes  . Drug Use: No  . Sexually Active: Not on file   Other Topics Concern  . Not on file   Social History Narrative  . No narrative on file    Family History  Problem Relation Age of Onset  . Stroke Father   . Cancer Sister     breast    Outpatient Encounter Prescriptions as of 08/10/2012  Medication Sig Dispense Refill  . [DISCONTINUED] albuterol (PROAIR HFA) 108 (90 BASE) MCG/ACT inhaler Inhale 2 puffs into the lungs every 6 (six) hours as needed.        .  [DISCONTINUED] HYDROcodone-acetaminophen (VICODIN) 5-500 MG per tablet Take 1 tablet by mouth every 8 (eight) hours as needed for pain.  21 tablet  0  . [DISCONTINUED] Testosterone (ANDROGEL PUMP) 20.25 MG/ACT (1.62%) GEL Place 2 Act onto the skin every morning.  75 g  2  . DULoxetine (CYMBALTA) 60 MG capsule Take 60 mg by mouth daily.        Marland Kitchen gabapentin (NEURONTIN) 300 MG capsule Take 300 mg by mouth 4 (four) times daily.        Marland Kitchen L-Methylfolate (DEPLIN) 7.5 MG TABS Take by mouth.        . levothyroxine (SYNTHROID) 137 MCG tablet Take 1 tablet (137 mcg total) by mouth daily.  90 tablet  1  . LITHIUM CARBONATE PO Take by mouth.        . OLANZapine (ZYPREXA) 7.5 MG tablet Take 7.5 mg by mouth at bedtime.        . rosuvastatin (CRESTOR) 10 MG tablet Take 1 tablet (10 mg total) by mouth daily.  30 tablet  5  . rosuvastatin (CRESTOR) 20 MG tablet Take 1 tablet (20 mg total)  by mouth daily.  90 tablet  3   No facility-administered encounter medications on file as of 08/10/2012.       Objective:   Physical Exam  Constitutional: He is oriented to person, place, and time. He appears well-developed and well-nourished.  HENT:  Head: Normocephalic and atraumatic.  Right Ear: External ear normal.  Left Ear: External ear normal.  Nose: Nose normal.  Mouth/Throat: Oropharynx is clear and moist.  Eyes: Conjunctivae and EOM are normal. Pupils are equal, round, and reactive to light.  Neck: Normal range of motion. Neck supple. No thyromegaly present.  Cardiovascular: Normal rate, regular rhythm, normal heart sounds and intact distal pulses.   Pulmonary/Chest: Effort normal and breath sounds normal.  Abdominal: Soft. Bowel sounds are normal. He exhibits no distension and no mass. There is no tenderness. There is no rebound and no guarding.  Genitourinary: Rectum normal and prostate normal. Guaiac negative stool.  Musculoskeletal: Normal range of motion.  Lymphadenopathy:    He has no cervical  adenopathy.  Neurological: He is alert and oriented to person, place, and time. He has normal reflexes.  Skin: Skin is warm and dry.  Psychiatric: He has a normal mood and affect. His behavior is normal. Judgment and thought content normal.          Assessment & Plan:  CPE Keep up a regular exercise program and make sure you are eating a healthy diet Try to eat 4 servings of dairy a day, or if you are lactose intolerant take a calcium with vitamin D daily.  Your vaccines are up to date.  Check CMP and fasting lipid panel. Check PSA.   Insomnia-this certainly could be from increased dose on the Cymbalta. There he was also taking the Cymbalta at bedtime initially and now has moved to morning. I encouraged him to try splitting the dose. 6 mg in the morning and 50 mg at bedtime and see if this makes a difference. If it doesn't then he might want to speak to his neurologist about going back down to 60 mg once a day for couple weeks to see if this improves his sleep quality. I hesitate to treat his insomnia when it may just be a side effect from one of his medications.  Hyperlipidemia-he's been out of the statin for several weeks. We will send a new prescription to his mail order pharmacy. Recheck lipids today knowing that he is not on a statin right now. Continue work on [employer] and regular exercise.  Hypothyroidism-due to recheck TSH.

## 2012-08-11 LAB — LDL CHOLESTEROL, DIRECT: Direct LDL: 99 mg/dL

## 2012-08-14 ENCOUNTER — Other Ambulatory Visit: Payer: Self-pay | Admitting: Family Medicine

## 2012-08-14 MED ORDER — ROSUVASTATIN CALCIUM 20 MG PO TABS: 20.0000 mg | ORAL_TABLET | Freq: Every day | ORAL | Status: DC

## 2012-08-15 ENCOUNTER — Other Ambulatory Visit: Payer: Self-pay | Admitting: *Deleted

## 2012-08-15 MED ORDER — LEVOTHYROXINE SODIUM 137 MCG PO TABS: 137.0000 ug | ORAL_TABLET | Freq: Every day | ORAL | Status: DC

## 2012-10-03 ENCOUNTER — Telehealth: Payer: Self-pay | Admitting: *Deleted

## 2012-10-03 NOTE — Telephone Encounter (Signed)
Pt states has had ringworm x 1 week. Treating with Lamisil and covers with bandaid but its not helping. Area is on inside of right forearm and states looks like another area starting about 2" from the original one. Wants to know if you will call something in for it. Itches per pt.

## 2012-10-03 NOTE — Telephone Encounter (Signed)
Pt notified of MD instructions. Kayce Chismar, LPN  

## 2012-10-03 NOTE — Telephone Encounter (Signed)
Lamisil is the correct treatment. Apply BID and don't cover with bandaid. Recommend OV since not improving. Plus usually takes 2 weeks of tx to actually improve

## 2012-10-11 ENCOUNTER — Telehealth: Payer: Self-pay | Admitting: Family Medicine

## 2012-10-11 DIAGNOSIS — G4700 Insomnia, unspecified: Secondary | ICD-10-CM

## 2012-10-11 DIAGNOSIS — R630 Anorexia: Secondary | ICD-10-CM

## 2012-10-11 DIAGNOSIS — R5381 Other malaise: Secondary | ICD-10-CM

## 2012-10-11 NOTE — Telephone Encounter (Signed)
Please call patient. I got a note from Dr. Mayford Knife. He had recommended that we actually checked your B12 and folate levels as you're having some symptoms that were similar to symptoms in the past when you have B12 deficiency. We would can certainly present a lab slip and if you would like to go to the lab any time we can take a look at this and see if he might benefit from B12 injections or additional oral folate or B12.

## 2012-10-11 NOTE — Telephone Encounter (Signed)
Pt informed of labs that are needed and will go today to have them done.Eddie Hernandez

## 2012-10-16 ENCOUNTER — Encounter: Payer: Self-pay | Admitting: *Deleted

## 2012-10-16 ENCOUNTER — Telehealth: Payer: Self-pay | Admitting: *Deleted

## 2012-10-16 NOTE — Telephone Encounter (Signed)
Pt calls and wants to know results of his labwork. I scanned a Vitamin B12 level this morning it was 512

## 2012-10-16 NOTE — Telephone Encounter (Signed)
Tell pt b12 is normal.

## 2012-10-17 NOTE — Telephone Encounter (Signed)
Pt notified of results

## 2012-11-28 ENCOUNTER — Other Ambulatory Visit: Payer: Self-pay | Admitting: *Deleted

## 2012-11-28 MED ORDER — LEVOTHYROXINE SODIUM 137 MCG PO TABS: 137.0000 ug | ORAL_TABLET | Freq: Every day | ORAL | Status: DC

## 2013-01-08 ENCOUNTER — Telehealth: Payer: Self-pay | Admitting: *Deleted

## 2013-01-08 ENCOUNTER — Other Ambulatory Visit: Payer: Self-pay | Admitting: Family Medicine

## 2013-01-08 DIAGNOSIS — E291 Testicular hypofunction: Secondary | ICD-10-CM

## 2013-01-08 NOTE — Telephone Encounter (Signed)
Needs testosterone level checked and an appointment

## 2013-01-09 ENCOUNTER — Other Ambulatory Visit: Payer: Self-pay | Admitting: *Deleted

## 2013-01-09 DIAGNOSIS — E291 Testicular hypofunction: Secondary | ICD-10-CM

## 2013-01-09 LAB — TESTOSTERONE: Testosterone: 264 ng/dL — ABNORMAL LOW (ref 300–890)

## 2013-01-12 ENCOUNTER — Ambulatory Visit: Payer: 59 | Admitting: Family Medicine

## 2013-01-22 ENCOUNTER — Other Ambulatory Visit: Payer: Self-pay | Admitting: Family Medicine

## 2013-01-23 LAB — TESTOSTERONE: Testosterone: 394 ng/dL (ref 300–890)

## 2013-01-25 ENCOUNTER — Ambulatory Visit: Payer: 59 | Admitting: Family Medicine

## 2013-02-14 DIAGNOSIS — T50901A Poisoning by unspecified drugs, medicaments and biological substances, accidental (unintentional), initial encounter: Secondary | ICD-10-CM | POA: Insufficient documentation

## 2013-02-14 DIAGNOSIS — J9690 Respiratory failure, unspecified, unspecified whether with hypoxia or hypercapnia: Secondary | ICD-10-CM | POA: Insufficient documentation

## 2013-02-15 DIAGNOSIS — R739 Hyperglycemia, unspecified: Secondary | ICD-10-CM | POA: Insufficient documentation

## 2013-02-15 DIAGNOSIS — E878 Other disorders of electrolyte and fluid balance, not elsewhere classified: Secondary | ICD-10-CM | POA: Insufficient documentation

## 2013-02-15 DIAGNOSIS — D649 Anemia, unspecified: Secondary | ICD-10-CM | POA: Insufficient documentation

## 2013-03-12 ENCOUNTER — Ambulatory Visit (INDEPENDENT_AMBULATORY_CARE_PROVIDER_SITE_OTHER): Payer: 59 | Admitting: Family Medicine

## 2013-03-12 ENCOUNTER — Encounter: Payer: Self-pay | Admitting: Family Medicine

## 2013-03-12 VITALS — BP 90/56 | HR 91 | Wt 179.0 lb

## 2013-03-12 DIAGNOSIS — Z23 Encounter for immunization: Secondary | ICD-10-CM

## 2013-03-12 DIAGNOSIS — G4700 Insomnia, unspecified: Secondary | ICD-10-CM

## 2013-03-12 DIAGNOSIS — F1010 Alcohol abuse, uncomplicated: Secondary | ICD-10-CM

## 2013-03-12 DIAGNOSIS — D649 Anemia, unspecified: Secondary | ICD-10-CM

## 2013-03-12 DIAGNOSIS — E039 Hypothyroidism, unspecified: Secondary | ICD-10-CM

## 2013-03-12 DIAGNOSIS — E291 Testicular hypofunction: Secondary | ICD-10-CM

## 2013-03-12 DIAGNOSIS — F3132 Bipolar disorder, current episode depressed, moderate: Secondary | ICD-10-CM

## 2013-03-12 DIAGNOSIS — R6889 Other general symptoms and signs: Secondary | ICD-10-CM

## 2013-03-12 DIAGNOSIS — T1491XA Suicide attempt, initial encounter: Secondary | ICD-10-CM

## 2013-03-12 NOTE — Progress Notes (Signed)
Subjective:    Patient ID: Eddie Hernandez, male    DOB: 09-Jun-1955, 57 y.o.   MRN: [id number]  HPI Here for hospital f/u for suicidal attemp.  Has appt with Emilio Math on 10/23.  Having trouble with vision, walking, and memory problems.  Labs showed he was anemic. He is having some hallucinations. He has been home for 2 days.  He wasin the ICU fro 8 days and then admitted to [facility]al health and then release 2 days ago. Has been having problems with short term memory, gait instability since then. His wife is with him today. They were sure if this was from possible lack of oxygen. Evidently when EMS arrived on the scene they had to intubate him immediately. He was able to come off the respirator the following day at the hospital. He was treated for alcohol withdrawal. He had overdosed with alcohol as well as pills.  Had a normal sleep study while there.  Says not sleeping but 2 hours at a time.  He is now on abilify and they dec his dose of neurontin. They also reduced his thyroid medication. They had him on librium.  He has lost 8 lbs. Feels disoriented to time.   Hypogonadism - He is interested in roll on medication. Used to use Androgel but doesn' like the application of this product.   Review of Systems  BP 90/56  Pulse 91  Wt 179 lb (81.194 kg)  BMI 24.27 kg/m2    Allergies  Allergen Reactions  . Sulfonamide Derivatives     REACTION: Rash    Past Medical History  Diagnosis Date  . Prostate pain   . Alcohol abuse     Past Surgical History  Procedure Laterality Date  . Deep brain stimulator placement  01-30-08    tremors    History   Social History  . Marital Status: Married    Spouse Name: N/A    Number of Children: N/A  . Years of Education: N/A   Occupational History  . Not on file.   Social History Main Topics  . Smoking status: Current Every Day Smoker -- 1.00 packs/day    Types: Cigarettes  . Smokeless tobacco: Not on file  . Alcohol Use: Yes  . Drug  Use: No  . Sexual Activity: Not on file   Other Topics Concern  . Not on file   Social History Narrative  . No narrative on file    Family History  Problem Relation Age of Onset  . Stroke Father   . Cancer Sister     breast    Outpatient Encounter Prescriptions as of 03/12/2013  Medication Sig Dispense Refill  . ARIPiprazole (ABILIFY) 5 MG tablet Take 5 mg by mouth daily.      Marland Kitchen gabapentin (NEURONTIN) 100 MG capsule Take 100 mg by mouth 4 (four) times daily.      Marland Kitchen L-Methylfolate (DEPLIN) 7.5 MG TABS Take by mouth.        . Levothyroxine Sodium 125 MCG CAPS Take 1 capsule by mouth daily before breakfast.      . pantoprazole (PROTONIX) 40 MG tablet Take 40 mg by mouth daily.      . protriptyline (VIVACTIL) 10 MG tablet Take 10 mg by mouth 2 (two) times daily.      . [DISCONTINUED] gabapentin (NEURONTIN) 300 MG capsule Take 300 mg by mouth 4 (four) times daily.        . [DISCONTINUED] levothyroxine (SYNTHROID) 137 MCG tablet Take 1  tablet (137 mcg total) by mouth daily.  90 tablet  1  . [DISCONTINUED] b complex vitamins tablet Take 1 tablet by mouth daily.      . [DISCONTINUED] cholecalciferol (VITAMIN D) 400 UNITS TABS Take 400 Units by mouth 2 (two) times daily.      . [DISCONTINUED] DULoxetine (CYMBALTA) 60 MG capsule Take 60 mg by mouth 2 (two) times daily.       . [DISCONTINUED] lithium carbonate (ESKALITH) 450 MG CR tablet Take 1 tablet (450 mg total) by mouth at bedtime.      . [DISCONTINUED] Multiple Vitamin (MULTIVITAMIN) capsule Take 1 capsule by mouth daily.      . [DISCONTINUED] OLANZapine (ZYPREXA) 7.5 MG tablet Take 7.5 mg by mouth at bedtime.        . [DISCONTINUED] rosuvastatin (CRESTOR) 20 MG tablet Take 1 tablet (20 mg total) by mouth daily.  90 tablet  3   No facility-administered encounter medications on file as of 03/12/2013.          Objective:   Physical Exam  Constitutional: He is oriented to person, place, and time. He appears well-developed and  well-nourished.  HENT:  Head: Normocephalic and atraumatic.  Neck: Neck supple. No thyromegaly present.  Cardiovascular: Normal rate, regular rhythm and normal heart sounds.   Pulmonary/Chest: Effort normal and breath sounds normal.  Lymphadenopathy:    He has no cervical adenopathy.  Neurological: He is alert and oriented to person, place, and time.  Skin: Skin is warm and dry.  Psychiatric: He has a normal mood and affect. His behavior is normal.          Assessment & Plan:  Suicidal attempt-fortunately, he did survive and he has his first followup with the psychiatrist next week. He is still having some memory issues but I suspect this probably from anoxic brain injury. And also with being on sedatives for the last couple weeks in addition to starting new psychiatric medications.  Memory loss/difficulty concentrating-He is still having some memory issues but I suspect this probably from anoxic brain injury. And also with being on sedatives for the last couple weeks in addition to starting new psychiatric medications. I explained that he will just have to wait and see how things improve over the next couple weeks even months. Also encouraged him to consider seeing his neurologist who has not seen him in quite some time. He does have I nerve stimulator in his spine for tremors.  Insomnia  - still struggling with poor sleep quality. Only getting about 2-3 hours. This actually started before he went to the hospital. Encouraged him to discuss this with his psychiatrist as they are still adjusting his medication.  Hypogonadism. - He would like to consider another form of testosterone replacement. I think it's important to make sure that his thyroid is well controlled before we restart her retested for testosterone.  Hypothyroidism-they decreased his dose on hospital. I would like to recheck it today to see if he is well regulated or if we need to increase his dose again.  Bipolar disorder-on  new medication regimen with Abilify and Vivactil. Has followup next week with psychiatry. His wife is here with him today. He does feel safe to be home currently.  Flu vaccine given today.   Dr. Donnetta Simpers (neurology)

## 2013-03-14 LAB — CBC
HCT: 40 % (ref 39.0–52.0)
MCHC: 34.3 g/dL (ref 30.0–36.0)
Platelets: 206 10*3/uL (ref 150–400)
RDW: 13 % (ref 11.5–15.5)
WBC: 5.9 10*3/uL (ref 4.0–10.5)

## 2013-03-14 LAB — COMPLETE METABOLIC PANEL WITH GFR
ALT: 13 U/L (ref 0–53)
AST: 14 U/L (ref 0–37)
Albumin: 3.8 g/dL (ref 3.5–5.2)
Alkaline Phosphatase: 73 U/L (ref 39–117)
BUN: 9 mg/dL (ref 6–23)
Calcium: 9.3 mg/dL (ref 8.4–10.5)
Chloride: 103 mEq/L (ref 96–112)
Potassium: 4.6 mEq/L (ref 3.5–5.3)
Sodium: 138 mEq/L (ref 135–145)

## 2013-03-14 LAB — FERRITIN: Ferritin: 131 ng/mL (ref 22–322)

## 2013-03-14 LAB — HEMOGLOBIN A1C
Hgb A1c MFr Bld: 6 % — ABNORMAL HIGH (ref <5.7)
Mean Plasma Glucose: 126 mg/dL — ABNORMAL HIGH (ref <117)

## 2013-05-02 ENCOUNTER — Other Ambulatory Visit: Payer: Self-pay | Admitting: *Deleted

## 2013-05-02 MED ORDER — PANTOPRAZOLE SODIUM 40 MG PO TBEC: 40.0000 mg | DELAYED_RELEASE_TABLET | Freq: Every day | ORAL | Status: DC

## 2013-05-02 MED ORDER — LEVOTHYROXINE SODIUM 125 MCG PO CAPS
1.0000 | ORAL_CAPSULE | Freq: Every day | ORAL | Status: DC
Start: 2013-05-02 — End: 2013-06-07

## 2013-06-07 ENCOUNTER — Other Ambulatory Visit: Payer: Self-pay | Admitting: *Deleted

## 2013-06-07 MED ORDER — LEVOTHYROXINE SODIUM 125 MCG PO CAPS: 1.0000 | ORAL_CAPSULE | Freq: Every day | ORAL | Status: DC

## 2013-06-07 MED ORDER — PANTOPRAZOLE SODIUM 40 MG PO TBEC: 40.0000 mg | DELAYED_RELEASE_TABLET | Freq: Every day | ORAL | Status: DC

## 2013-06-08 ENCOUNTER — Other Ambulatory Visit: Payer: Self-pay | Admitting: *Deleted

## 2013-06-08 MED ORDER — LEVOTHYROXINE SODIUM 125 MCG PO TABS: 125.0000 ug | ORAL_TABLET | Freq: Every day | ORAL | Status: DC

## 2013-09-27 ENCOUNTER — Ambulatory Visit (INDEPENDENT_AMBULATORY_CARE_PROVIDER_SITE_OTHER): Payer: 59 | Admitting: Family Medicine

## 2013-09-27 ENCOUNTER — Encounter: Payer: Self-pay | Admitting: Family Medicine

## 2013-09-27 VITALS — BP 129/76 | HR 84 | Temp 97.3°F | Wt 189.0 lb

## 2013-09-27 DIAGNOSIS — J020 Streptococcal pharyngitis: Secondary | ICD-10-CM

## 2013-09-27 DIAGNOSIS — E348 Other specified endocrine disorders: Secondary | ICD-10-CM

## 2013-09-27 DIAGNOSIS — J029 Acute pharyngitis, unspecified: Secondary | ICD-10-CM

## 2013-09-27 LAB — POCT RAPID STREP A (OFFICE): Rapid Strep A Screen: POSITIVE — AB

## 2013-09-27 LAB — POCT GLYCOSYLATED HEMOGLOBIN (HGB A1C): HEMOGLOBIN A1C: 5.6

## 2013-09-27 MED ORDER — PANTOPRAZOLE SODIUM 40 MG PO TBEC: 40.0000 mg | DELAYED_RELEASE_TABLET | Freq: Every day | ORAL | Status: DC

## 2013-09-27 MED ORDER — PENICILLIN G BENZATHINE 1200000 UNIT/2ML IM SUSP
1.2000 10*6.[IU] | Freq: Once | INTRAMUSCULAR | Status: AC
  Administered 2013-09-27: 1.2 10*6.[IU] via INTRAMUSCULAR

## 2013-09-27 MED ORDER — LEVOTHYROXINE SODIUM 125 MCG PO TABS: 125.0000 ug | ORAL_TABLET | Freq: Every day | ORAL | Status: DC

## 2013-09-27 NOTE — Addendum Note (Signed)
Addended by: Teddy Spike on: 09/27/2013 04:52 PM   Modules accepted: Orders

## 2013-09-27 NOTE — Progress Notes (Signed)
   Subjective:    Patient ID: Eddie Hernandez, male    DOB: 04-10-1956, 58 y.o.   MRN: [id number]  HPI ST x 3 days. Can barefly swallo. Gland are swollen in the neck. + bodyaches.  + HA and no cough. Post nasal drip. No tooth pain.  No exposure to strep known   Review of Systems     Objective:   Physical Exam  Constitutional: He is oriented to person, place, and time. He appears well-developed and well-nourished.  HENT:  Head: Normocephalic and atraumatic.  Right Ear: External ear normal.  Left Ear: External ear normal.  Nose: Nose normal.  Mouth/Throat: Oropharynx is clear and moist.  TMs and canals are clear. Left side of posterior pharynx is more erythematous than the right. No lesions.  Eyes: Conjunctivae and EOM are normal. Pupils are equal, round, and reactive to light.  Neck: Neck supple. No thyromegaly present.  Left anterior cervical lymph node superiorly is approximately 2 cm in size.  Cardiovascular: Normal rate and normal heart sounds.   Pulmonary/Chest: Effort normal and breath sounds normal.  Lymphadenopathy:    He has cervical adenopathy.  Neurological: He is alert and oriented to person, place, and time.  Skin: Skin is warm and dry.  Psychiatric: He has a normal mood and affect.          Assessment & Plan:  Strep Pharyngitis - given 1.2 million units of penicillin. Recommended a new toothbrush in 2-3 days. Reviewed hygiene to avoid spreading this. Work note given for tomorrow. Okay to return to work on Sunday. Handout provided.   IFG - repeat A1c is at goal today. Recheck in 6-12 months.

## 2013-09-27 NOTE — Patient Instructions (Signed)
Strep Throat  Strep throat is an infection of the throat caused by a bacteria named Streptococcus pyogenes. Your caregiver may call the infection streptococcal "tonsillitis" or "pharyngitis" depending on whether there are signs of inflammation in the tonsils or back of the throat. Strep throat is most common in children aged 58 15 years during the cold months of the year, but it can occur in people of any age during any season. This infection is spread from person to person (contagious) through coughing, sneezing, or other close contact.  SYMPTOMS   · Fever or chills.  · Painful, swollen, red tonsils or throat.  · Pain or difficulty when swallowing.  · White or yellow spots on the tonsils or throat.  · Swollen, tender lymph nodes or "glands" of the neck or under the jaw.  · Red rash all over the body (rare).  DIAGNOSIS   Many different infections can cause the same symptoms. A test must be done to confirm the diagnosis so the right treatment can be given. A "rapid strep test" can help your caregiver make the diagnosis in a few minutes. If this test is not available, a light swab of the infected area can be used for a throat culture test. If a throat culture test is done, results are usually available in a day or two.  TREATMENT   Strep throat is treated with antibiotic medicine.  HOME CARE INSTRUCTIONS   · Gargle with 1 tsp of salt in 1 cup of warm water, 3 4 times per day or as needed for comfort.  · Family members who also have a sore throat or fever should be tested for strep throat and treated with antibiotics if they have the strep infection.  · Make sure everyone in your household washes their hands well.  · Do not share food, drinking cups, or personal items that could cause the infection to spread to others.  · You may need to eat a soft food diet until your sore throat gets better.  · Drink enough water and fluids to keep your urine clear or pale yellow. This will help prevent dehydration.  · Get plenty of  rest.  · Stay home from school, daycare, or work until you have been on antibiotics for 24 hours.  · Only take over-the-counter or prescription medicines for pain, discomfort, or fever as directed by your caregiver.  · If antibiotics are prescribed, take them as directed. Finish them even if you start to feel better.  SEEK MEDICAL CARE IF:   · The glands in your neck continue to enlarge.  · You develop a rash, cough, or earache.  · You cough up green, yellow-brown, or bloody sputum.  · You have pain or discomfort not controlled by medicines.  · Your problems seem to be getting worse rather than better.  SEEK IMMEDIATE MEDICAL CARE IF:   · You develop any new symptoms such as vomiting, severe headache, stiff or painful neck, chest pain, shortness of breath, or trouble swallowing.  · You develop severe throat pain, drooling, or changes in your voice.  · You develop swelling of the neck, or the skin on the neck becomes red and tender.  · You have a fever.  · You develop signs of dehydration, such as fatigue, dry mouth, and decreased urination.  · You become increasingly sleepy, or you cannot wake up completely.  Document Released: 05/14/2000 Document Revised: 05/03/2012 Document Reviewed: 07/16/2010  [employer]® Patient Information ©2014 [employer].

## 2014-05-09 ENCOUNTER — Other Ambulatory Visit: Payer: Self-pay | Admitting: Family Medicine

## 2014-06-03 ENCOUNTER — Ambulatory Visit (INDEPENDENT_AMBULATORY_CARE_PROVIDER_SITE_OTHER): Payer: 59 | Admitting: Family Medicine

## 2014-06-03 ENCOUNTER — Ambulatory Visit (INDEPENDENT_AMBULATORY_CARE_PROVIDER_SITE_OTHER): Payer: 59 | Admitting: *Deleted

## 2014-06-03 ENCOUNTER — Encounter: Payer: Self-pay | Admitting: Family Medicine

## 2014-06-03 VITALS — BP 138/87 | HR 90 | Temp 97.1°F | Ht 72.0 in | Wt 171.0 lb

## 2014-06-03 DIAGNOSIS — Z23 Encounter for immunization: Secondary | ICD-10-CM

## 2014-06-03 DIAGNOSIS — T1491XA Suicide attempt, initial encounter: Secondary | ICD-10-CM | POA: Insufficient documentation

## 2014-06-03 DIAGNOSIS — R252 Cramp and spasm: Secondary | ICD-10-CM

## 2014-06-03 DIAGNOSIS — Z125 Encounter for screening for malignant neoplasm of prostate: Secondary | ICD-10-CM

## 2014-06-03 DIAGNOSIS — R7301 Impaired fasting glucose: Secondary | ICD-10-CM

## 2014-06-03 DIAGNOSIS — E038 Other specified hypothyroidism: Secondary | ICD-10-CM

## 2014-06-03 DIAGNOSIS — Z0000 Encounter for general adult medical examination without abnormal findings: Secondary | ICD-10-CM

## 2014-06-03 NOTE — Addendum Note (Signed)
Addended by: Teddy Spike on: 06/03/2014 03:59 PM   Modules accepted: Orders, Medications

## 2014-06-03 NOTE — Patient Instructions (Signed)
Keep up a regular exercise program and make sure you are eating a healthy diet Try to eat 4 servings of dairy a day, or if you are lactose intolerant take a calcium with vitamin D daily.  Your vaccines are up to date.    Leg Cramps Leg cramps that occur during exercise can be caused by poor circulation or dehydration. However, muscle cramps that occur at rest or during the night are usually not due to any serious medical problem. Heat cramps may cause muscle spasms during hot weather.  CAUSES There is no clear cause for muscle cramps. However, dehydration may be a factor for those who do not drink enough fluids and those who exercise in the heat. Imbalances in the level of sodium, potassium, calcium or magnesium in the muscle tissue may also be a factor. Some medications, such as water pills (diuretics), may cause loss of chemicals that the body needs (like sodium and potassium) and cause muscle cramps. TREATMENT   Make sure your diet has enough fluids and essential minerals for the muscle to work normally.  Avoid strenuous exercise for several days if you have been having frequent leg cramps.  Stretch and massage the cramped muscle for several minutes.  Some medicines may be helpful in some patients with night cramps. Only take over-the-counter or prescription medicines as directed by your caregiver. SEEK IMMEDIATE MEDICAL CARE IF:   Your leg cramps become worse.  Your foot becomes cold, numb, or blue. Document Released: 06/24/2004 Document Revised: 08/09/2011 Document Reviewed: 06/11/2008 [facility] Patient Information 2015 [place], [place]. This information is not intended to replace advice given to you by your health care provider. Make sure you discuss any questions you have with your health care provider.

## 2014-06-03 NOTE — Progress Notes (Signed)
   Subjective:    Patient ID: Eddie Hernandez, male    DOB: 09-18-1955, 59 y.o.   MRN: [id number]  HPI 59 year old white male who is here today for complete physical exam. Last colonoscopy in 2008. Last tetanus vaccine in 2011. Has had HIV screening previously.  Due ro eye apt. Last one was 2 yr ago.   Leg cramping almost every months for several months.  Has been on has been on his feet more. Left lower led is worse than the right. He says it first started when he was on multiple psychiatric medications. Most of those have been adjusted and has stabilized but the cramping has persisted.  Review of Systems     Objective:   Physical Exam  Constitutional: He is oriented to person, place, and time. He appears well-developed and well-nourished.  HENT:  Head: Normocephalic and atraumatic.  Right Ear: External ear normal.  Left Ear: External ear normal.  Nose: Nose normal.  Mouth/Throat: Oropharynx is clear and moist.  Eyes: Conjunctivae and EOM are normal. Pupils are equal, round, and reactive to light.  Neck: Normal range of motion. Neck supple. No thyromegaly present.  Cardiovascular: Normal rate, regular rhythm, normal heart sounds and intact distal pulses.   Pulmonary/Chest: Effort normal and breath sounds normal.  Abdominal: Soft. Bowel sounds are normal. He exhibits no distension and no mass. There is no tenderness. There is no rebound and no guarding.  Musculoskeletal: Normal range of motion.  Lymphadenopathy:    He has no cervical adenopathy.  Neurological: He is alert and oriented to person, place, and time. He has normal reflexes.  Skin: Skin is warm and dry.  Psychiatric: He has a normal mood and affect. His behavior is normal. Judgment and thought content normal.          Assessment & Plan:  CPE-  Keep up a regular exercise program and make sure you are eating a healthy diet Try to eat 4 servings of dairy a day, or if you are lactose intolerant take a calcium with  vitamin D daily.  Your vaccines are up to date.   Chronic constipation - Discussed trial of miralax. Can be safely used on a regular basis. He will try this. Says fiber just causes gas and doesn't help much.    Leg cramping- will check potassium, magnesium.  Work on hydration and stretching.  Work on hard increasing calories.

## 2014-06-04 ENCOUNTER — Other Ambulatory Visit: Payer: Self-pay | Admitting: Family Medicine

## 2014-06-04 ENCOUNTER — Other Ambulatory Visit: Payer: Self-pay | Admitting: *Deleted

## 2014-06-04 DIAGNOSIS — E039 Hypothyroidism, unspecified: Secondary | ICD-10-CM

## 2014-06-04 LAB — COMPLETE METABOLIC PANEL WITH GFR
ALK PHOS: 83 U/L (ref 39–117)
ALT: 20 U/L (ref 0–53)
AST: 25 U/L (ref 0–37)
Albumin: 4.5 g/dL (ref 3.5–5.2)
BUN: 11 mg/dL (ref 6–23)
CHLORIDE: 103 meq/L (ref 96–112)
CO2: 28 mEq/L (ref 19–32)
CREATININE: 0.8 mg/dL (ref 0.50–1.35)
Calcium: 9.5 mg/dL (ref 8.4–10.5)
GFR, Est African American: >89 mL/min
Glucose, Bld: 101 mg/dL — ABNORMAL HIGH (ref 70–99)
POTASSIUM: 4.7 meq/L (ref 3.5–5.3)
SODIUM: 140 meq/L (ref 135–145)
Total Bilirubin: 0.4 mg/dL (ref 0.2–1.2)
Total Protein: 6.9 g/dL (ref 6.0–8.3)

## 2014-06-04 LAB — LIPID PANEL
CHOLESTEROL: 192 mg/dL (ref 0–200)
HDL: 41 mg/dL (ref >39)
LDL Cholesterol: 118 mg/dL — ABNORMAL HIGH (ref 0–99)
Total CHOL/HDL Ratio: 4.7 Ratio
Triglycerides: 164 mg/dL — ABNORMAL HIGH (ref <150)
VLDL: 33 mg/dL (ref 0–40)

## 2014-06-04 LAB — HEMOGLOBIN A1C
Hgb A1c MFr Bld: 5.7 % — ABNORMAL HIGH (ref <5.7)
Mean Plasma Glucose: 117 mg/dL — ABNORMAL HIGH (ref <117)

## 2014-06-04 LAB — MAGNESIUM: MAGNESIUM: 1.8 mg/dL (ref 1.5–2.5)

## 2014-06-04 LAB — TSH: TSH: 4.415 u[IU]/mL (ref 0.350–4.500)

## 2014-06-04 LAB — PSA: PSA: 2.8 ng/mL (ref <=4.00)

## 2014-06-04 MED ORDER — LEVOTHYROXINE SODIUM 137 MCG PO TABS: 137.0000 ug | ORAL_TABLET | Freq: Every day | ORAL | Status: DC

## 2014-06-05 ENCOUNTER — Other Ambulatory Visit: Payer: Self-pay | Admitting: *Deleted

## 2014-08-22 LAB — TSH: TSH: 5.834 u[IU]/mL — AB (ref 0.350–4.500)

## 2014-08-26 ENCOUNTER — Encounter: Payer: Self-pay | Admitting: Family Medicine

## 2014-08-26 ENCOUNTER — Ambulatory Visit (INDEPENDENT_AMBULATORY_CARE_PROVIDER_SITE_OTHER): Payer: BLUE CROSS/BLUE SHIELD | Admitting: Family Medicine

## 2014-08-26 VITALS — BP 122/85 | HR 96 | Wt 173.0 lb

## 2014-08-26 DIAGNOSIS — E039 Hypothyroidism, unspecified: Secondary | ICD-10-CM | POA: Diagnosis not present

## 2014-08-26 DIAGNOSIS — J441 Chronic obstructive pulmonary disease with (acute) exacerbation: Secondary | ICD-10-CM | POA: Diagnosis not present

## 2014-08-26 DIAGNOSIS — J209 Acute bronchitis, unspecified: Secondary | ICD-10-CM

## 2014-08-26 DIAGNOSIS — J019 Acute sinusitis, unspecified: Secondary | ICD-10-CM

## 2014-08-26 MED ORDER — PREDNISONE 20 MG PO TABS: 40.0000 mg | ORAL_TABLET | Freq: Every day | ORAL | Status: DC

## 2014-08-26 MED ORDER — AZITHROMYCIN 250 MG PO TABS: ORAL_TABLET | ORAL | Status: DC

## 2014-08-26 MED ORDER — LEVOTHYROXINE SODIUM 150 MCG PO TABS: 150.0000 ug | ORAL_TABLET | Freq: Every day | ORAL | Status: DC

## 2014-08-26 NOTE — Progress Notes (Signed)
   Subjective:    Patient ID: Eddie Hernandez, male    DOB: [birth year], 59 y.o.   MRN: [id number]  HPI Cough and nasal congestion x 2 weeks. Cough is worse when lays down at night or bends over.  Then can't stop the cough.  No fever, chills or sweats. Tried robitussion cold. Now using mucinex. Not really helping.  No GI sxs. He does have a history of mild COPD.   Hypothyroid- we recently rechecked his thyroid. TSH was mildly elevated. He's currently taking the levothyroxine 135 g daily. He says he is taking it consistently on an empty stomach well before breakfast and at least 4 hours away from other medications and supplements and vitamins. He has not noticed any significant changes in skin or hair or constipation but has felt more tired recently.  Review of Systems     Objective:   Physical Exam  Constitutional: He is oriented to person, place, and time. He appears well-developed and well-nourished.  HENT:  Head: Normocephalic and atraumatic.  Right Ear: External ear normal.  Left Ear: External ear normal.  Nose: Nose normal.  Mouth/Throat: Oropharynx is clear and moist.  TMs and canals are clear.   Eyes: Conjunctivae and EOM are normal. Pupils are equal, round, and reactive to light.  Neck: Neck supple. No thyromegaly present.  Cardiovascular: Normal rate and normal heart sounds.   Pulmonary/Chest: Effort normal. He has no wheezes.  Diffuse rhonchi  Lymphadenopathy:    He has no cervical adenopathy.  Neurological: He is alert and oriented to person, place, and time.  Skin: Skin is warm and dry.  Psychiatric: He has a normal mood and affect.          Assessment & Plan:  Acute sinusitis/Bronchitis/COPD exacerbation -   He declines albuterol inhaler. He says he used to couple years ago and felt like it really wasn't helpful and does not want a prescription sent. We'll treat him with 5 days of prednisone and azithromycin. Cough not significantly better in one week or call  sooner if suddenly worse. He is really due to recheck spirometry this year.  Hypothyroidism-since he is taking his medication appropriately we will increase his levothyroxine 250 g and then recheck his level in 6-8 weeks.

## 2014-08-26 NOTE — Patient Instructions (Signed)
Go to lab in 6-8 weeks to recheck thyroid.

## 2014-10-15 LAB — TSH: TSH: 0.99 u[IU]/mL (ref 0.350–4.500)

## 2014-11-21 ENCOUNTER — Other Ambulatory Visit: Payer: Self-pay | Admitting: Family Medicine

## 2014-12-12 ENCOUNTER — Ambulatory Visit (INDEPENDENT_AMBULATORY_CARE_PROVIDER_SITE_OTHER): Payer: BLUE CROSS/BLUE SHIELD | Admitting: Family Medicine

## 2014-12-12 ENCOUNTER — Encounter: Payer: Self-pay | Admitting: Family Medicine

## 2014-12-12 VITALS — BP 121/73 | HR 102 | Ht 72.0 in | Wt 171.0 lb

## 2014-12-12 DIAGNOSIS — L03116 Cellulitis of left lower limb: Secondary | ICD-10-CM | POA: Diagnosis not present

## 2014-12-12 MED ORDER — CEPHALEXIN 500 MG PO CAPS: 500.0000 mg | ORAL_CAPSULE | Freq: Four times a day (QID) | ORAL | Status: DC

## 2014-12-12 NOTE — Progress Notes (Signed)
   Subjective:    Patient ID: Eddie Hernandez, male    DOB: 1956/04/02, 59 y.o.   MRN: [id number]  HPI Left foot pain - Says noticed some pain and callous on the foot and though it was a corn last week. Started using a corn pad on it.  Then yesterday noticed something in there.  Squeezed it and got out a splinter and some pus. Painful to walk on his foot.  No other treatments.    Review of Systems     Objective:   Physical Exam  Constitutional: He is oriented to person, place, and time. He appears well-developed and well-nourished.  HENT:  Head: Normocephalic and atraumatic.  Neurological: He is alert and oriented to person, place, and time.  Skin: Skin is warm and dry. There is erythema.  Has an a round area of thick white skin with a hole in the center.  The wound is actually very clean. No pus or bleeding.  He does have some surrounding erythema about 2 cm from the border of the lesin.   Psychiatric: He has a normal mood and affect. His behavior is normal.          Assessment & Plan:  Cellulitis, left foot - Start on keflex. Call if not better in one week. Work note given today.   Wound care discussed.

## 2015-03-28 ENCOUNTER — Encounter: Payer: Self-pay | Admitting: Family Medicine

## 2015-03-28 ENCOUNTER — Ambulatory Visit (INDEPENDENT_AMBULATORY_CARE_PROVIDER_SITE_OTHER): Payer: BLUE CROSS/BLUE SHIELD | Admitting: Family Medicine

## 2015-03-28 VITALS — BP 120/80 | HR 89 | Temp 98.3°F | Wt 176.0 lb

## 2015-03-28 DIAGNOSIS — J029 Acute pharyngitis, unspecified: Secondary | ICD-10-CM

## 2015-03-28 DIAGNOSIS — H109 Unspecified conjunctivitis: Secondary | ICD-10-CM | POA: Diagnosis not present

## 2015-03-28 DIAGNOSIS — J019 Acute sinusitis, unspecified: Secondary | ICD-10-CM

## 2015-03-28 LAB — POCT RAPID STREP A (OFFICE): RAPID STREP A SCREEN: NEGATIVE

## 2015-03-28 MED ORDER — CIPROFLOXACIN HCL 0.3 % OP SOLN: 2.0000 [drp] | OPHTHALMIC | Status: DC

## 2015-03-28 NOTE — Addendum Note (Signed)
Addended by: Elizabeth Sauer on: 03/28/2015 01:43 PM   Modules accepted: Orders, Medications

## 2015-03-28 NOTE — Progress Notes (Signed)
   Subjective:    Patient ID: Eddie Hernandez, male    DOB: 07/11/1955, 59 y.o.   MRN: [id number]  HPI ST with low grade temp x 5-6 days. Highest temp right at 100.0.  Left eye has been watering as well, that started last night.  Getting white/yellow discharge. Having to whip it a lot.  Eye is red now and burns a little. No foreign body, etc. Did notice a white spot in his eye. He's also had some nasal congestion. He says sometimes it feels like it's in the back of his sinuses and sometimes it's more in the front and he gets a runny drippy nose. He does have some postnasal drip. No facial pain or pressure. No vision changes.   Review of Systems     Objective:   Physical Exam  Constitutional: He is oriented to person, place, and time. He appears well-developed and well-nourished.  HENT:  Head: Normocephalic and atraumatic.  Right Ear: External ear normal.  Left Ear: External ear normal.  Nose: Nose normal.  Mouth/Throat: Oropharynx is clear and moist.  TMs and canals are clear. Sclera is injected. No lid swelling.   Eyes: Conjunctivae and EOM are normal. Pupils are equal, round, and reactive to light.  Neck: Neck supple. No thyromegaly present.  Cardiovascular: Normal rate and normal heart sounds.   Pulmonary/Chest: Effort normal and breath sounds normal.  Lymphadenopathy:    He has no cervical adenopathy.  Neurological: He is alert and oriented to person, place, and time.  Skin: Skin is warm and dry.  Psychiatric: He has a normal mood and affect.          Assessment & Plan:  Acute sinusitis with pharyngitis-most likely viral. If not feeling some better by next week then please give the office a call back. Recommended a trial of Mucinex D. Warned not to take the decongestive before bedtime or can keep him awake.  Conjunctivitis, left - question bacterial etiology since there was such an abrupt onset 5 days into the current illness. We'll go ahead and treat with Cipro eyedrops.  Call if not better after the weekend.

## 2015-03-28 NOTE — Progress Notes (Signed)
Called Rx & they had not noted the Rx on file.

## 2015-03-28 NOTE — Patient Instructions (Signed)
Recommend trial of Mucinex - D for the sinus congestion.

## 2015-05-15 ENCOUNTER — Ambulatory Visit (INDEPENDENT_AMBULATORY_CARE_PROVIDER_SITE_OTHER): Payer: BLUE CROSS/BLUE SHIELD | Admitting: Family Medicine

## 2015-05-15 ENCOUNTER — Encounter: Payer: Self-pay | Admitting: Family Medicine

## 2015-05-15 VITALS — BP 154/93 | HR 84 | Temp 97.6°F | Ht 72.0 in | Wt 171.0 lb

## 2015-05-15 DIAGNOSIS — J209 Acute bronchitis, unspecified: Secondary | ICD-10-CM

## 2015-05-15 DIAGNOSIS — J441 Chronic obstructive pulmonary disease with (acute) exacerbation: Secondary | ICD-10-CM | POA: Diagnosis not present

## 2015-05-15 MED ORDER — AZITHROMYCIN 250 MG PO TABS: ORAL_TABLET | ORAL | Status: AC

## 2015-05-15 MED ORDER — PREDNISONE 20 MG PO TABS: 40.0000 mg | ORAL_TABLET | Freq: Every day | ORAL | Status: DC

## 2015-05-15 MED ORDER — ALBUTEROL SULFATE HFA 108 (90 BASE) MCG/ACT IN AERS: 2.0000 | INHALATION_SPRAY | Freq: Four times a day (QID) | RESPIRATORY_TRACT | Status: DC | PRN

## 2015-05-15 NOTE — Progress Notes (Signed)
   Subjective:    Patient ID: Eddie Hernandez, male    DOB: May 16, 1956, 59 y.o.   MRN: [id number]  HPI 85 you with mild COPD comes in for Cough x 3 weeks.  ST and post nasal drip. Feels really run down and low grade temp and body aches.  Chest is hurting with cough now.  Taking robitussion- CF and helps some.  Getting a lot of sputum up as well.  He was on PCN for a week and now amox for dental work.    Review of Systems     Objective:   Physical Exam  Constitutional: He is oriented to person, place, and time. He appears well-developed and well-nourished.  HENT:  Head: Normocephalic and atraumatic.  Right Ear: External ear normal.  Left Ear: External ear normal.  Nose: Nose normal.  Mouth/Throat: Oropharynx is clear and moist.  TMs and canals are clear.   Eyes: Conjunctivae and EOM are normal. Pupils are equal, round, and reactive to light.  Neck: Neck supple. No thyromegaly present.  Cardiovascular: Normal rate and normal heart sounds.   Pulmonary/Chest: Effort normal.  Diffuse rhonchi on the right and left side. Worse at the bases bilaterally.  Lymphadenopathy:    He has no cervical adenopathy.  Neurological: He is alert and oriented to person, place, and time.  Skin: Skin is warm and dry.  Psychiatric: He has a normal mood and affect.          Assessment & Plan:  COPD exacerbation with acute bronchitis-will treat with azithromycin and prednisone. His albuterol inhalers a couple years old. New prescription sent to the pharmacy and I encouraged him to use it a couple times a day until he is feeling a little bit better.

## 2015-06-30 ENCOUNTER — Other Ambulatory Visit: Payer: Self-pay | Admitting: Family Medicine

## 2015-09-05 ENCOUNTER — Telehealth: Payer: Self-pay

## 2015-09-05 DIAGNOSIS — L84 Corns and callosities: Secondary | ICD-10-CM

## 2015-09-05 NOTE — Telephone Encounter (Signed)
Patient called for a referral to Podiatry. Referral sent.

## 2015-09-08 DIAGNOSIS — L03032 Cellulitis of left toe: Secondary | ICD-10-CM | POA: Diagnosis not present

## 2015-09-08 DIAGNOSIS — M2042 Other hammer toe(s) (acquired), left foot: Secondary | ICD-10-CM | POA: Diagnosis not present

## 2015-09-08 DIAGNOSIS — L84 Corns and callosities: Secondary | ICD-10-CM | POA: Diagnosis not present

## 2015-09-08 DIAGNOSIS — M79672 Pain in left foot: Secondary | ICD-10-CM | POA: Diagnosis not present

## 2015-09-22 DIAGNOSIS — L03032 Cellulitis of left toe: Secondary | ICD-10-CM | POA: Diagnosis not present

## 2015-09-22 DIAGNOSIS — Z5181 Encounter for therapeutic drug level monitoring: Secondary | ICD-10-CM | POA: Diagnosis not present

## 2015-09-22 DIAGNOSIS — L84 Corns and callosities: Secondary | ICD-10-CM | POA: Diagnosis not present

## 2015-09-23 ENCOUNTER — Encounter: Payer: Self-pay | Admitting: Physician Assistant

## 2015-09-23 ENCOUNTER — Ambulatory Visit (INDEPENDENT_AMBULATORY_CARE_PROVIDER_SITE_OTHER): Payer: BLUE CROSS/BLUE SHIELD | Admitting: Physician Assistant

## 2015-09-23 VITALS — BP 150/83 | HR 85 | Ht 72.0 in | Wt 184.0 lb

## 2015-09-23 DIAGNOSIS — R42 Dizziness and giddiness: Secondary | ICD-10-CM | POA: Diagnosis not present

## 2015-09-23 DIAGNOSIS — R202 Paresthesia of skin: Secondary | ICD-10-CM

## 2015-09-23 DIAGNOSIS — R3589 Other polyuria: Secondary | ICD-10-CM | POA: Insufficient documentation

## 2015-09-23 DIAGNOSIS — R682 Dry mouth, unspecified: Secondary | ICD-10-CM

## 2015-09-23 DIAGNOSIS — R2681 Unsteadiness on feet: Secondary | ICD-10-CM | POA: Insufficient documentation

## 2015-09-23 DIAGNOSIS — R7301 Impaired fasting glucose: Secondary | ICD-10-CM

## 2015-09-23 DIAGNOSIS — E039 Hypothyroidism, unspecified: Secondary | ICD-10-CM

## 2015-09-23 DIAGNOSIS — R209 Unspecified disturbances of skin sensation: Secondary | ICD-10-CM | POA: Diagnosis not present

## 2015-09-23 DIAGNOSIS — R269 Unspecified abnormalities of gait and mobility: Secondary | ICD-10-CM | POA: Diagnosis not present

## 2015-09-23 DIAGNOSIS — R358 Other polyuria: Secondary | ICD-10-CM

## 2015-09-23 DIAGNOSIS — R200 Anesthesia of skin: Secondary | ICD-10-CM

## 2015-09-23 LAB — POCT GLYCOSYLATED HEMOGLOBIN (HGB A1C): Hemoglobin A1C: 5.6

## 2015-09-23 LAB — POCT URINALYSIS DIPSTICK
Bilirubin, UA: NEGATIVE
Glucose, UA: NEGATIVE
Ketones, UA: NEGATIVE
LEUKOCYTES UA: NEGATIVE
NITRITE UA: NEGATIVE
PH UA: 6
PROTEIN UA: NEGATIVE
RBC UA: NEGATIVE
Spec Grav, UA: 1.015
UROBILINOGEN UA: 0.2

## 2015-09-23 NOTE — Progress Notes (Addendum)
   Subjective:    Patient ID: Eddie Hernandez, male    DOB: 26-Oct-1955, 60 y.o.   MRN: [id number]  HPI Patient states that he has been having fatigue, dry mouth, polydipsia, polyuria, dizziness, headache, increased appetite, and decreased sleep x1 week. He also reports weakness in his extremities, numbness in his toes and fingers, and tingling in his arms. He denies fever, chest pain, palpitations.  He has never experienced symptoms like this before.   He started taking Keflex two weeks ago for corns on his feet. Otherwise, he has not started any new medications. He ran out of his Abilify one week ago and was unable to get it refilled until yesterday. He did take an Abilify last night but states he has seen no improvement in his symptoms. He has run out of his Abilify before and it did not cause symptoms like this.     Review of Systems  Constitutional: Positive for appetite change, fatigue and unexpected weight change (10 lbs weight gain over the past month).  Respiratory: Negative for chest tightness, shortness of breath and wheezing.   Cardiovascular: Negative for chest pain, palpitations and leg swelling.  Endocrine: Positive for polydipsia, polyphagia and polyuria.  Genitourinary: Negative for dysuria, urgency, frequency and difficulty urinating.  Musculoskeletal: Negative for arthralgias and gait problem.  Neurological: Positive for dizziness, weakness and numbness.  Psychiatric/Behavioral: Positive for sleep disturbance (Decreased).  All other systems reviewed and are negative.      Objective:   Physical Exam  Constitutional: He appears well-developed and well-nourished.  HENT:  Head: Normocephalic and atraumatic.  Neck: No thyromegaly present.  Cardiovascular: Normal rate, regular rhythm and normal heart sounds.   Pulmonary/Chest: Effort normal and breath sounds normal.  Abdominal: Soft. Bowel sounds are normal.  Skin:  Fingers are cold to the touch.             Assessment & Plan:  1. Fatigue/Dizzines/Dry mouth/Insomnia-unclear etiology. HgA1c today 5.6. He has a history of hypothyroidism. Last TSH 10/14/14 was 0.9. UA negative. Will order TSH, Testosterone, Vitamin B12, Vitamin D, CBC, CMP, and ANA to determine etiology of symptoms. Could be not having Abilify making some of these symptoms. Will determine follow up after labs.   2. Hypothyroidism- Patient has a history of hypothyroidism. He is taking 150 MCG of Levothroid daily as prescribed. TSH 10/14/14 was 0.9. Will recheck TSH,T4, and T3 today.

## 2015-09-24 LAB — COMPLETE METABOLIC PANEL WITH GFR
ALBUMIN: 4.4 g/dL (ref 3.6–5.1)
ALK PHOS: 71 U/L (ref 40–115)
ALT: 31 U/L (ref 9–46)
AST: 28 U/L (ref 10–35)
BILIRUBIN TOTAL: 0.3 mg/dL (ref 0.2–1.2)
BUN: 18 mg/dL (ref 7–25)
CALCIUM: 8.7 mg/dL (ref 8.6–10.3)
CO2: 25 mmol/L (ref 20–31)
CREATININE: 0.94 mg/dL (ref 0.70–1.33)
Chloride: 106 mmol/L (ref 98–110)
GFR, Est Non African American: 88 mL/min (ref >=60)
Glucose, Bld: 92 mg/dL (ref 65–99)
Potassium: 4.8 mmol/L (ref 3.5–5.3)
Sodium: 140 mmol/L (ref 135–146)
TOTAL PROTEIN: 7.3 g/dL (ref 6.1–8.1)

## 2015-09-24 LAB — CBC WITH DIFFERENTIAL/PLATELET
BASOS PCT: 0 %
Basophils Absolute: 0 cells/uL (ref 0–200)
Eosinophils Absolute: 150 cells/uL (ref 15–500)
Eosinophils Relative: 2 %
HCT: 41.1 % (ref 38.5–50.0)
Hemoglobin: 13.7 g/dL (ref 13.2–17.1)
LYMPHS PCT: 20 %
Lymphs Abs: 1500 cells/uL (ref 850–3900)
MCH: 30.8 pg (ref 27.0–33.0)
MCHC: 33.3 g/dL (ref 32.0–36.0)
MCV: 92.4 fL (ref 80.0–100.0)
MONOS PCT: 9 %
MPV: 10.4 fL (ref 7.5–12.5)
Monocytes Absolute: 675 cells/uL (ref 200–950)
Neutro Abs: 5175 cells/uL (ref 1500–7800)
Neutrophils Relative %: 69 %
PLATELETS: 227 10*3/uL (ref 140–400)
RBC: 4.45 MIL/uL (ref 4.20–5.80)
RDW: 13.3 % (ref 11.0–15.0)
WBC: 7.5 10*3/uL (ref 3.8–10.8)

## 2015-09-24 LAB — ANA: Anti Nuclear Antibody(ANA): NEGATIVE

## 2015-09-24 LAB — VITAMIN D 25 HYDROXY (VIT D DEFICIENCY, FRACTURES): Vit D, 25-Hydroxy: 42 ng/mL (ref 30–100)

## 2015-09-24 LAB — TSH: TSH: 5.58 m[IU]/L — AB (ref 0.40–4.50)

## 2015-09-24 LAB — VITAMIN B12: Vitamin B-12: 897 pg/mL (ref 200–1100)

## 2015-09-24 LAB — C-REACTIVE PROTEIN: CRP: 0.5 mg/dL (ref <0.60)

## 2015-09-24 LAB — T4, FREE: Free T4: 1.2 ng/dL (ref 0.8–1.8)

## 2015-09-24 LAB — SEDIMENTATION RATE: Sed Rate: 6 mm/hr (ref 0–20)

## 2015-09-24 MED ORDER — LEVOTHYROXINE SODIUM 175 MCG PO TABS: 175.0000 ug | ORAL_TABLET | Freq: Every day | ORAL | Status: DC

## 2015-09-24 NOTE — Addendum Note (Signed)
Addended by: Donella Stade on: 09/24/2015 05:11 PM   Modules accepted: Orders

## 2015-10-01 DIAGNOSIS — L03211 Cellulitis of face: Secondary | ICD-10-CM | POA: Diagnosis not present

## 2015-10-01 DIAGNOSIS — L03213 Periorbital cellulitis: Secondary | ICD-10-CM | POA: Diagnosis not present

## 2015-10-01 DIAGNOSIS — L0201 Cutaneous abscess of face: Secondary | ICD-10-CM | POA: Diagnosis not present

## 2015-10-21 ENCOUNTER — Encounter: Payer: Self-pay | Admitting: Family Medicine

## 2015-10-21 ENCOUNTER — Ambulatory Visit (INDEPENDENT_AMBULATORY_CARE_PROVIDER_SITE_OTHER): Payer: BLUE CROSS/BLUE SHIELD | Admitting: Family Medicine

## 2015-10-21 VITALS — BP 127/81 | HR 80 | Wt 187.0 lb

## 2015-10-21 DIAGNOSIS — Z0000 Encounter for general adult medical examination without abnormal findings: Secondary | ICD-10-CM

## 2015-10-21 DIAGNOSIS — Z1159 Encounter for screening for other viral diseases: Secondary | ICD-10-CM

## 2015-10-21 DIAGNOSIS — Z801 Family history of malignant neoplasm of trachea, bronchus and lung: Secondary | ICD-10-CM

## 2015-10-21 DIAGNOSIS — R208 Other disturbances of skin sensation: Secondary | ICD-10-CM

## 2015-10-21 DIAGNOSIS — E039 Hypothyroidism, unspecified: Secondary | ICD-10-CM | POA: Diagnosis not present

## 2015-10-21 DIAGNOSIS — R200 Anesthesia of skin: Secondary | ICD-10-CM

## 2015-10-21 NOTE — Progress Notes (Signed)
Subjective:    Patient ID: Eddie Hernandez, male    DOB: 1956-05-01, 60 y.o.   MRN: [id number]  HPI   Here for CPE today.    He does complain he feels like his feet are going more numb. He's been having a lot of problems with calluses etc. and so went to see a podiatrist. They have been treating those. He is currently on gabapentin for his neuropathy. He has never had a formal EMG study.  Hypo-thyroidism-adjusted his dose 4 weeks ago. He denies any recent skin or hair changes. And in fact he actually has gained a few pounds.   Review of Systems Comprehensive ROS is negative.   BP 127/81 mmHg  Pulse 80  Wt 187 lb (84.823 kg)  SpO2 100%    Allergies  Allergen Reactions  . Sulfonamide Derivatives     REACTION: Rash    Past Medical History  Diagnosis Date  . Prostate pain   . Alcohol abuse     Past Surgical History  Procedure Laterality Date  . Deep brain stimulator placement  01-30-08    tremors    Social History   Social History  . Marital Status: Married    Spouse Name: Maudry Mayhew   . Number of Children: N/A  . Years of Education: N/A   Occupational History  . works in Scientist, research (medical).      Social History Main Topics  . Smoking status: Current Every Day Smoker -- 1.00 packs/day    Types: Cigarettes  . Smokeless tobacco: Not on file  . Alcohol Use: No     Comment: abstinent   . Drug Use: No  . Sexual Activity: Not on file   Other Topics Concern  . Not on file   Social History Narrative   Works in Scientist, research (medical).  On his feet all day. No active exercise.     Family History  Problem Relation Age of Onset  . Stroke Father   . Cancer Sister     Lung     Outpatient Encounter Prescriptions as of 10/21/2015  Medication Sig  . ARIPiprazole (ABILIFY) 5 MG tablet Take 5 mg by mouth daily.  Marland Kitchen b complex vitamins tablet Take 1 tablet by mouth daily.  . Cholecalciferol (VITAMIN D-3 PO) Take 1,000 mg by mouth.  . gabapentin (NEURONTIN) 100 MG capsule Take 100 mg by mouth 4 (four)  times daily.  Marland Kitchen gabapentin (NEURONTIN) 300 MG capsule Take 300 mg by mouth at bedtime.  Marland Kitchen levothyroxine (SYNTHROID, LEVOTHROID) 175 MCG tablet Take 1 tablet (175 mcg total) by mouth daily before breakfast.  . mirtazapine (REMERON SOL-TAB) 15 MG disintegrating tablet Take 15 mg by mouth daily.  . Multiple Vitamins-Minerals (MULTIVITAMIN PO) Take 1 capsule by mouth daily.  Marland Kitchen NUVIGIL 250 MG tablet Take 250 mg by mouth daily.  . protriptyline (VIVACTIL) 10 MG tablet Take 10 mg by mouth 2 (two) times daily.   No facility-administered encounter medications on file as of 10/21/2015.        Objective:   Physical Exam  Constitutional: He is oriented to person, place, and time. He appears well-developed and well-nourished.  HENT:  Head: Normocephalic and atraumatic.  Right Ear: External ear normal.  Left Ear: External ear normal.  Nose: Nose normal.  Mouth/Throat: Oropharynx is clear and moist.  Eyes: Conjunctivae and EOM are normal. Pupils are equal, round, and reactive to light.  Neck: Normal range of motion. Neck supple. No thyromegaly present.  Cardiovascular: Normal rate, regular rhythm, normal  heart sounds and intact distal pulses.   Pulmonary/Chest: Effort normal and breath sounds normal.  Abdominal: Soft. Bowel sounds are normal. He exhibits no distension and no mass. There is no tenderness. There is no rebound and no guarding.  Musculoskeletal: Normal range of motion.  Lymphadenopathy:    He has no cervical adenopathy.  Neurological: He is alert and oriented to person, place, and time. He has normal reflexes.  Skin: Skin is warm and dry.  Psychiatric: He has a normal mood and affect. His behavior is normal. Judgment and thought content normal.          Assessment & Plan:  CPE-  Keep up a regular exercise program and make sure you are eating a healthy diet Try to eat 4 servings of dairy a day, or if you are lactose intolerant take a calcium with vitamin D daily.  Your vaccines  are up to date.    Neuropathy of feet bilaterally-Nerve conduction study ordered.    He is also high risk as he is a heavy tobacco smoker and has a family history of lung cancer his sister. Will see if he qualifies for a low-dose CT scan of the chest.  Hypothyroidism-we adjusted his thyroid dose about 4 weeks ago. Since he is here today we will go ahead and recheck level.

## 2015-10-22 LAB — HEPATITIS C ANTIBODY: HCV Ab: NEGATIVE

## 2015-10-22 LAB — HEPATITIS PANEL, ACUTE
HCV Ab: NEGATIVE
HEP A IGM: NONREACTIVE
HEP B C IGM: NONREACTIVE
Hepatitis B Surface Ag: NEGATIVE

## 2015-10-22 LAB — TSH: TSH: 0.55 mIU/L (ref 0.40–4.50)

## 2015-10-23 LAB — LIPID PANEL
Cholesterol: 203 mg/dL — ABNORMAL HIGH (ref 125–200)
HDL: 34 mg/dL — AB (ref >=40)
LDL CALC: 99 mg/dL (ref <130)
TRIGLYCERIDES: 348 mg/dL — AB (ref <150)
Total CHOL/HDL Ratio: 6 Ratio — ABNORMAL HIGH (ref <=5.0)
VLDL: 70 mg/dL — ABNORMAL HIGH (ref <30)

## 2015-10-29 ENCOUNTER — Other Ambulatory Visit: Payer: Self-pay | Admitting: Acute Care

## 2015-10-29 DIAGNOSIS — F17210 Nicotine dependence, cigarettes, uncomplicated: Principal | ICD-10-CM

## 2015-11-14 ENCOUNTER — Ambulatory Visit (INDEPENDENT_AMBULATORY_CARE_PROVIDER_SITE_OTHER): Payer: BLUE CROSS/BLUE SHIELD | Admitting: Neurology

## 2015-11-14 ENCOUNTER — Ambulatory Visit (INDEPENDENT_AMBULATORY_CARE_PROVIDER_SITE_OTHER): Payer: Self-pay | Admitting: Neurology

## 2015-11-14 DIAGNOSIS — G621 Alcoholic polyneuropathy: Secondary | ICD-10-CM | POA: Diagnosis not present

## 2015-11-14 DIAGNOSIS — Z0289 Encounter for other administrative examinations: Secondary | ICD-10-CM

## 2015-11-14 DIAGNOSIS — R200 Anesthesia of skin: Secondary | ICD-10-CM

## 2015-11-14 NOTE — Procedures (Signed)
[facility]    Provider:  Dr Jaynee Eagles Referring Provider: Hali Marry, * Primary Care Physician:  Beatrice Lecher, MD  CC:  numbness  History :  Eddie Hernandez is a 60 y.o. male here as a referral from Dr. Madilyn Fireman for numbness in his feet. The toes are numb. Been going on over a year. Shooting pain in the toes. He has a long history of alcoholism as well as hypothyroidism.   Summary  Nerve conduction studies were performed on the bilateral lower extremities:  The bilateral Peroneal motor nerves showed normal conductions with normal F Wave latency The bilateral Tibial motor nerves showed normal conductions with normal F Wave latency The bilateral Sural sensory nerves were within normal limits Bilateral H Reflexes showed normal latencies  EMG Needle study was performed on selected bilateral lower extremity muscles:   The Abductor Hallucis muscle showed increased spontaneous activity (+3 PSW and + fibs) and increased motor unit amplitude bilaterally. The Vastus Medialis,  Anterior Tibialis, Medial Gastrocnemius, Extensor Hallucis Longus muscles were within normal limits.   Conclusion:  Acute/ongoing denervation and chronic neurogenic changes were seen in a distal foot muscle bilaterally. No evidence for radiculopathy. Given patient's clinical history this is consistent with length-dependent axonal polyneuropathy.      Harlene Salts, [facility] [address] [place], [place] [address]  Phone [phone] Fax [phone]

## 2015-11-14 NOTE — Progress Notes (Signed)
See procedure note.

## 2015-11-14 NOTE — Progress Notes (Signed)
  [facility]    Provider:  Dr Jaynee Eagles Referring Provider: Hali Marry, * Primary Care Physician:  Beatrice Lecher, MD  CC:  numbness  History :  Eddie Hernandez is a 60 y.o. male here as a referral from Dr. Madilyn Fireman for numbness in his feet. The toes are numb. Been going on over a year. Shooting pain in the toes. He has a long history of alcoholism as well as hypothyroidism.   Summary  Nerve conduction studies were performed on the bilateral lower extremities:  The bilateral Peroneal motor nerves showed normal conductions with normal F Wave latency The bilateral Tibial motor nerves showed normal conductions with normal F Wave latency The bilateral Sural sensory nerves were within normal limits Bilateral H Reflexes showed normal latencies  EMG Needle study was performed on selected bilateral lower extremity muscles:   The Abductor Hallucis muscle showed increased spontaneous activity (+3 PSW and + fibs) and increased motor unit amplitude bilaterally. The Vastus Medialis,  Anterior Tibialis, Medial Gastrocnemius, Extensor Hallucis Longus muscles were within normal limits.   Conclusion:  Acute/ongoing denervation and chronic neurogenic changes were seen in a distal foot muscle bilaterally. No evidence for radiculopathy. Given patient's clinical history this is consistent with length-dependent sensorimotor axonal polyneuropathy.      Harlene Salts, [facility] [address] [place], [place] [address]  Phone [phone] Fax [phone]

## 2015-11-19 ENCOUNTER — Encounter: Payer: BLUE CROSS/BLUE SHIELD | Admitting: Acute Care

## 2015-11-19 ENCOUNTER — Ambulatory Visit: Payer: BLUE CROSS/BLUE SHIELD

## 2015-11-22 ENCOUNTER — Other Ambulatory Visit: Payer: Self-pay | Admitting: Physician Assistant

## 2015-12-25 ENCOUNTER — Other Ambulatory Visit: Payer: Self-pay | Admitting: Family Medicine

## 2015-12-30 ENCOUNTER — Other Ambulatory Visit: Payer: Self-pay | Admitting: *Deleted

## 2015-12-30 MED ORDER — LEVOTHYROXINE SODIUM 175 MCG PO TABS: ORAL_TABLET | ORAL | 3 refills | Status: DC

## 2016-01-20 ENCOUNTER — Telehealth: Payer: Self-pay | Admitting: *Deleted

## 2016-01-20 DIAGNOSIS — E039 Hypothyroidism, unspecified: Secondary | ICD-10-CM

## 2016-01-20 NOTE — Telephone Encounter (Signed)
Order faxed.Victoriana Aziz Lynetta  

## 2016-01-23 LAB — TSH: TSH: 0.13 m[IU]/L — AB (ref 0.40–4.50)

## 2016-01-23 MED ORDER — LEVOTHYROXINE SODIUM 88 MCG PO TABS: 88.0000 ug | ORAL_TABLET | Freq: Every day | ORAL | 2 refills | Status: DC

## 2016-01-23 MED ORDER — LEVOTHYROXINE SODIUM 175 MCG PO TABS: ORAL_TABLET | ORAL | 3 refills | Status: DC

## 2016-01-23 NOTE — Addendum Note (Signed)
Addended by: Teddy Spike on: 01/23/2016 09:24 AM   Modules accepted: Orders

## 2016-01-23 NOTE — Addendum Note (Signed)
Addended by: Teddy Spike on: 01/23/2016 09:23 AM   Modules accepted: Orders

## 2016-04-20 ENCOUNTER — Ambulatory Visit (INDEPENDENT_AMBULATORY_CARE_PROVIDER_SITE_OTHER): Payer: Managed Care, Other (non HMO) | Admitting: Family Medicine

## 2016-04-20 ENCOUNTER — Encounter: Payer: Self-pay | Admitting: Family Medicine

## 2016-04-20 VITALS — BP 158/111 | HR 94 | Ht 72.0 in | Wt 191.0 lb

## 2016-04-20 DIAGNOSIS — E039 Hypothyroidism, unspecified: Secondary | ICD-10-CM

## 2016-04-20 DIAGNOSIS — R5383 Other fatigue: Secondary | ICD-10-CM | POA: Diagnosis not present

## 2016-04-20 MED ORDER — ARIPIPRAZOLE 5 MG PO TABS: 5.0000 mg | ORAL_TABLET | Freq: Every day | ORAL | 1 refills | Status: DC

## 2016-04-20 NOTE — Patient Instructions (Addendum)
Thank you for coming in today. STOP the  Start Abilify again.  Return to your psychiatrist.  Get labs today.  Call or go to the emergency room if you get worse, have trouble breathing, have chest pains, or palpitations.

## 2016-04-20 NOTE — Progress Notes (Signed)
Eddie Hernandez is a 60 y.o. male who presents to [facility] today for fatigue. Patient recently switched from Abilify to Brexpiprazole due to cost. He's been on the new medicine now for about a week and feels much worse. He notes fatigue. He denies significant new chest pain palpitations. Additionally he notes that the deep brain stimulator that has been in place now for about 10 years to treat tremor is running out of batteries. He is interested in referral however his original neurologist is no longer practicing this type of neurology.   Past Medical History:  Diagnosis Date  . Alcohol abuse   . Prostate pain    Past Surgical History:  Procedure Laterality Date  . DEEP BRAIN STIMULATOR PLACEMENT  01-30-08   tremors   Social History  Substance Use Topics  . Smoking status: Current Every Day Smoker    Packs/day: 1.00    Types: Cigarettes  . Smokeless tobacco: Not on file  . Alcohol use No     Comment: abstinent    family history includes Cancer in his sister; Stroke in his father.  ROS as above:  Medications: Current Outpatient Prescriptions  Medication Sig Dispense Refill  . ARIPiprazole (ABILIFY) 5 MG tablet Take 1 tablet (5 mg total) by mouth daily. 30 tablet 1  . b complex vitamins tablet Take 1 tablet by mouth daily.    . Cholecalciferol (VITAMIN D-3 PO) Take 1,000 mg by mouth.    . gabapentin (NEURONTIN) 100 MG capsule Take 100 mg by mouth 4 (four) times daily.    Marland Kitchen gabapentin (NEURONTIN) 300 MG capsule     . levothyroxine (SYNTHROID, LEVOTHROID) 175 MCG tablet Take this medicine on an empty stomach, 30 minutes before eating. 30 tablet 3  . mirtazapine (REMERON SOL-TAB) 15 MG disintegrating tablet Take 15 mg by mouth daily.    . Multiple Vitamins-Minerals (MULTIVITAMIN PO) Take 1 capsule by mouth daily.    Marland Kitchen NUVIGIL 250 MG tablet Take 250 mg by mouth  daily.    . protriptyline (VIVACTIL) 10 MG tablet Take 10 mg by mouth 2 (two) times daily.     No current facility-administered medications for this visit.    Allergies  Allergen Reactions  . Sulfonamide Derivatives     REACTION: Rash    Health Maintenance Health Maintenance  Topic Date Due  . ZOSTAVAX  03/10/2016  . COLONOSCOPY  07/28/2016  . TETANUS/TDAP  08/31/2019  . INFLUENZA VACCINE  Completed  . Hepatitis C Screening  Completed  . HIV Screening  Completed     Exam:  BP (!) 158/111   Pulse 94   Ht 6' (1.829 m)   Wt 191 lb (86.6 kg)   SpO2 100%   BMI 25.90 kg/m  Gen: Well NAD nontoxic appearing HEENT: EOMI,  MMM Lungs: Normal work of breathing. CTABL Heart: RRR no MRG Abd: NABS, Soft. Nondistended, Nontender Exts: Brisk capillary refill, warm and well perfused.  Neuro: Mild intention tremor present.  EKG: Normal sinus rhythm at 89 bpm. Difficult to interpret due to abnormalities in leads I , III, aVL and V1 due to stimulator noise. Otherwise EKG is unremarkable with no ST segment elevation or depression or Q waves.   No results found for this or any previous visit (from the past 72 hour(s)). No results found.    Assessment and Plan: 60 y.o. male with  Drug reaction to any medication. Switch from Brexpiprazole act to  Abilify. This medication will be about $24 using a good Rx coupon. Additionally we'll obtain limited lab workup listed below. We'll discuss with PCP regarding referral to a new neurologist for brain stimulator change if needed.   Orders Placed This Encounter  Procedures  . CBC  . COMPLETE METABOLIC PANEL WITH GFR  . TSH    Discussed warning signs or symptoms. Please see discharge instructions. Patient expresses understanding.

## 2016-04-21 LAB — CBC
HEMATOCRIT: 41.2 % (ref 38.5–50.0)
Hemoglobin: 14.1 g/dL (ref 13.2–17.1)
MCH: 31.1 pg (ref 27.0–33.0)
MCHC: 34.2 g/dL (ref 32.0–36.0)
MCV: 90.7 fL (ref 80.0–100.0)
MPV: 10.8 fL (ref 7.5–12.5)
PLATELETS: 237 10*3/uL (ref 140–400)
RBC: 4.54 MIL/uL (ref 4.20–5.80)
RDW: 13.8 % (ref 11.0–15.0)
WBC: 12.9 10*3/uL — ABNORMAL HIGH (ref 3.8–10.8)

## 2016-04-21 LAB — COMPLETE METABOLIC PANEL WITH GFR
ALBUMIN: 4.2 g/dL (ref 3.6–5.1)
ALK PHOS: 93 U/L (ref 40–115)
ALT: 33 U/L (ref 9–46)
AST: 32 U/L (ref 10–35)
BILIRUBIN TOTAL: 0.2 mg/dL (ref 0.2–1.2)
BUN: 13 mg/dL (ref 7–25)
CALCIUM: 9.4 mg/dL (ref 8.6–10.3)
CO2: 28 mmol/L (ref 20–31)
Chloride: 103 mmol/L (ref 98–110)
Creat: 0.93 mg/dL (ref 0.70–1.25)
GFR, EST NON AFRICAN AMERICAN: 89 mL/min (ref >=60)
Glucose, Bld: 83 mg/dL (ref 65–99)
Potassium: 4.9 mmol/L (ref 3.5–5.3)
Sodium: 137 mmol/L (ref 135–146)
TOTAL PROTEIN: 6.5 g/dL (ref 6.1–8.1)

## 2016-04-21 LAB — TSH: TSH: 0.53 m[IU]/L (ref 0.40–4.50)

## 2016-04-21 NOTE — Addendum Note (Signed)
Addended by: Huel Cote on: 04/21/2016 03:48 PM   Modules accepted: Orders

## 2016-04-28 ENCOUNTER — Telehealth: Payer: Self-pay | Admitting: Family Medicine

## 2016-04-28 DIAGNOSIS — R259 Unspecified abnormal involuntary movements: Secondary | ICD-10-CM

## 2016-04-28 DIAGNOSIS — Z9689 Presence of other specified functional implants: Secondary | ICD-10-CM | POA: Insufficient documentation

## 2016-04-28 DIAGNOSIS — G250 Essential tremor: Secondary | ICD-10-CM

## 2016-04-28 NOTE — Telephone Encounter (Signed)
-----   Message from Hali Marry, MD sent at 04/21/2016 12:58 PM EST ----- Regarding: RE: Brain Stimulator Follow up Dr. [name] at [facility] does tons of these. If you don't mind putting order in.. Thank you.     ----- Message ----- From: Gregor Hams, MD Sent: 04/20/2016   4:40 PM To: Hali Marry, MD Subject: Brain Stimulator Follow up                     He thinks his Deep Brain Stimulator is running out of batteries. His original neurologist is no longer doing it. Does [facility] do that? [facility]? Do you want to do the referral or shall I?  Thanks, Eddie Hernandez

## 2016-04-29 ENCOUNTER — Telehealth: Payer: Self-pay | Admitting: Family Medicine

## 2016-04-29 ENCOUNTER — Ambulatory Visit (INDEPENDENT_AMBULATORY_CARE_PROVIDER_SITE_OTHER): Payer: Managed Care, Other (non HMO) | Admitting: Family Medicine

## 2016-04-29 ENCOUNTER — Encounter: Payer: Self-pay | Admitting: Family Medicine

## 2016-04-29 VITALS — BP 139/71 | HR 88 | Ht 72.0 in | Wt 194.0 lb

## 2016-04-29 DIAGNOSIS — Z9689 Presence of other specified functional implants: Secondary | ICD-10-CM

## 2016-04-29 DIAGNOSIS — R5383 Other fatigue: Secondary | ICD-10-CM

## 2016-04-29 DIAGNOSIS — J449 Chronic obstructive pulmonary disease, unspecified: Secondary | ICD-10-CM

## 2016-04-29 DIAGNOSIS — Z720 Tobacco use: Secondary | ICD-10-CM

## 2016-04-29 DIAGNOSIS — E039 Hypothyroidism, unspecified: Secondary | ICD-10-CM

## 2016-04-29 DIAGNOSIS — T50905D Adverse effect of unspecified drugs, medicaments and biological substances, subsequent encounter: Secondary | ICD-10-CM

## 2016-04-29 NOTE — Progress Notes (Signed)
Subjective:    CC: F/U fatigue   HPI:  Mr. Eddie Hernandez came in about a week ago and & One of our sports medicine provider is particularly complaining of fatigue. He was recently switched from Abilify to Brexpiprazole due to cost.  He did have some lab work done including a CMP and CBC which were normal. TSH looks perfect. Dr. Georgina Snell actually help him find the Abilify much cheaper at [facility]. Eczematous to $200 difference. So he decided to switch back to the Abilify which she preferred and he is now feeling 100% better.  He also has a deep brain stimulator for essential tremor and he feels like the batteries are running out. He had it placed about 10 years ago. His former neurologist is no longer practicing this type of neurology and so he needs to find a new provider who can adjust and maintain his deep brain stimulator.  Mild COPD-he never went for the lung cancer screening test that was ordered in May.. It was around the time that he which Wynetta Emery did not have insurance for a while.  Past medical history, Surgical history, Family history not pertinant except as noted below, Social history, Allergies, and medications have been entered into the medical record, reviewed, and corrections made.   Review of Systems: No fevers, chills, night sweats, weight loss, chest pain, or shortness of breath.   Objective:    General: Well Developed, well nourished, and in no acute distress.  Neuro: Alert and oriented x3, extra-ocular muscles intact, sensation grossly intact.  HEENT: Normocephalic, atraumatic  Skin: Warm and dry, no rashes. Cardiac: Regular rate and rhythm, no murmurs rubs or gallops, no lower extremity edema.  Respiratory: Clear to auscultation bilaterally. Not using accessory muscles, speaking in full sentences.   Impression and Recommendations:   Fatigue - secondary to medication side effects. Now these back on the Abilify he feels more like himself. Fatigue is completely resolved.  DBS-  referred to Dr. Carles Collet at [facility] for further management.    COPD-still continues to smoke. We do need to get him back in for another spirometry. Per our records the last one was several years ago. Year some extra noises in the right upper lung field. Try to reorder the CT lung screening for cancer.

## 2016-04-29 NOTE — Telephone Encounter (Signed)
Pt advised. Verbalized understanding. No further questions.  

## 2016-04-29 NOTE — Patient Instructions (Signed)
Recheck thyroid in 8 weeks.

## 2016-04-29 NOTE — Telephone Encounter (Signed)
Please call pt and let him know I did put in order for Chest CT for lung cancer screening.

## 2016-06-07 ENCOUNTER — Telehealth: Payer: Self-pay | Admitting: Acute Care

## 2016-06-07 DIAGNOSIS — F17210 Nicotine dependence, cigarettes, uncomplicated: Principal | ICD-10-CM

## 2016-06-07 NOTE — Telephone Encounter (Signed)
This msg was sent in to the appt pool this is for a [facility] pt is aware we will contact him

## 2016-06-10 NOTE — Progress Notes (Signed)
Eddie Hernandez was seen today in the movement disorders clinic for neurologic consultation at the request of Eddie Hernandez.    The consultation is for the evaluation of tremor.   I have tried multiple times to get records from Eddie Hernandez but that office is closed and phone numbers/fax numbers provided as a place to get records are not active numbers.  Pt does state that he hasn't f/u with Eddie Hernandez in several years.   I have queried care everywhere and no neurology records are available, but there are some neurosurgery records.  I have one neurology record from [place] from 07/2007.  The patient was on lithium then.  It appears he underwent deep brain stimulation in September, 2009.  It is unclear if he was on antipsychotics at the time per records, and the patient states that he is unsure.  He was admitted to the hospital in September, 2014 with alcohol withdrawal and delirium tremens.  He was placed on Zyprexa then.  He was hospitalized again in August, 2016 for alcohol abuse and bipolar disorder/depression and was placed on Abilify then.  Just a few months ago his Abilify was changed to [place] (brexipiprazole) but ended up going back to abilify when he was able to find it at an affordable cost.  He sees Eddie Hernandez for psychiatry.  How long has it been going on? "most of my life" - states that he is an alcoholic so had it with that and started drinking at 61y/o so hard to state when tremor itself started At rest or with activation?  Both at rest and with activation When is it noted the most?  Drinking liquids/writing - gone post DBS (but off of lithium now) Fam hx of tremor?  "my father was an alcoholic and he shook too" Located where?  Hands bilaterally Affected by caffeine:  Yes.   ("I drink as much caffeine as I can get."  Drinks energy drinks and coffee) Affected by alcohol:  Yes.   (last EtOH 2 years ago)   Neuroimaging has previously been performed.  It is not available for my  review today.  CT brain done at [facility] 02/14/2013 was reported to be unremarkable.  Stated that there was bilateral DBS in place, but it did not state the structure in which the DBS was located.  However, there was a CT of the brain done in 05/26/2008 that stated that there were bilateral subthalamic electrodes.  PREVIOUS MEDICATIONS: none to date  ALLERGIES:   Allergies  Allergen Reactions  . Brexpiprazole Other (See Comments)    Extreme fatigue, raised BP, SOB  . Sulfonamide Derivatives     REACTION: Rash    CURRENT MEDICATIONS:  Outpatient Encounter Prescriptions as of 06/14/2016  Medication Sig  . ARIPiprazole (ABILIFY) 5 MG tablet Take 1 tablet (5 mg total) by mouth daily.  Marland Kitchen b complex vitamins tablet Take 1 tablet by mouth daily.  . Cholecalciferol (VITAMIN D-3 PO) Take 1,000 mg by mouth.  . gabapentin (NEURONTIN) 100 MG capsule Take 100 mg by mouth 4 (four) times daily.  Marland Kitchen gabapentin (NEURONTIN) 300 MG capsule at bedtime.   Marland Kitchen ibuprofen (ADVIL,MOTRIN) 200 MG tablet Take 800 mg by mouth 2 (two) times daily.  Marland Kitchen levothyroxine (SYNTHROID, LEVOTHROID) 175 MCG tablet Take this medicine on an empty stomach, 30 minutes before eating. (Patient taking differently: Take 175 mcg by mouth. 5 times a week)  . levothyroxine (SYNTHROID, LEVOTHROID) 88 MCG tablet Take 88 mcg by mouth  2 (two) times a week.  . mirtazapine (REMERON SOL-TAB) 15 MG disintegrating tablet Take 15 mg by mouth daily.  . Multiple Vitamins-Minerals (MULTIVITAMIN PO) Take 1 capsule by mouth daily.  Marland Kitchen NUVIGIL 250 MG tablet Take 250 mg by mouth daily.  . protriptyline (VIVACTIL) 10 MG tablet Take 10 mg by mouth 2 (two) times daily.  Marland Kitchen pyridoxine (B-6) 100 MG tablet Take 100 mg by mouth 2 (two) times daily.   No facility-administered encounter medications on file as of 06/14/2016.     PAST MEDICAL HISTORY:   Past Medical History:  Diagnosis Date  . Alcohol abuse   . Bipolar 1 disorder ([place])   . Hypothyroidism   .  Peripheral neuropathy ([facility])    small fiber  . Prostate pain   . Tremor     PAST SURGICAL HISTORY:   Past Surgical History:  Procedure Laterality Date  . DEEP BRAIN STIMULATOR PLACEMENT  01-30-08   tremors    SOCIAL HISTORY:   Social History   Social History  . Marital status: Married    Spouse name: Eddie Hernandez   . Number of children: N/A  . Years of education: N/A   Occupational History  . works in Scientist, research (medical).       Eddie Hernandez   Social History Main Topics  . Smoking status: Current Every Day Smoker    Packs/day: 1.00    Types: Cigarettes  . Smokeless tobacco: Never Used  . Alcohol use No     Comment: hx of EtOH abuse  . Drug use: No  . Sexual activity: Not on file   Other Topics Concern  . Not on file   Social History Narrative   Works in Scientist, research (medical).  On his feet all day. No active exercise.     FAMILY HISTORY:   Family Status  Relation Status  . Father Deceased  . Sister Deceased  . Mother Deceased  . Brother Alive  . Sister Alive    ROS:  Feet paresthesias with "small fiber neuropathy."  A complete 10 system review of systems was obtained and was unremarkable apart from what is mentioned above.  PHYSICAL EXAMINATION:    VITALS:   Vitals:   06/14/16 0830  BP: 140/78  Pulse: 88  Weight: 194 lb (88 kg)  Height: 6' (1.829 m)    GEN:  The patient appears stated age and is in NAD. HEENT:  Normocephalic, atraumatic.  The mucous membranes are moist. The superficial temporal arteries are without ropiness or tenderness. CV:  RRR Lungs:  CTAB Neck/HEME:  There are no carotid bruits bilaterally.  Neurological examination:  Orientation: The patient is alert and oriented x3. Fund of knowledge is appropriate.  Recent and remote memory are intact.  Attention and concentration are normal.    Able to name objects and repeat phrases. Cranial nerves: There is good facial symmetry. Pupils are equal round and reactive to light bilaterally. Fundoscopic exam reveals clear  margins bilaterally. Extraocular muscles are intact. The visual fields are full to confrontational testing. The speech is fluent and clear. Soft palate rises symmetrically and there is no tongue deviation. Hearing is intact to conversational tone. Sensation: Sensation is intact to light and pinprick throughout (facial, trunk, extremities). Vibration is decreased at the bilateral big toe. There is no extinction with double simultaneous stimulation. There is no sensory dermatomal level identified. Motor: Strength is 5/5 in the bilateral upper and lower extremities.   Shoulder shrug is equal and symmetric.  There is no pronator  drift. Deep tendon reflexes: Deep tendon reflexes are 2/4 at the bilateral biceps, triceps, brachioradialis, 2+ patella and trace at the bilateral  achilles. Plantar responses are downgoing bilaterally.  Movement examination: Tone: There is normal tone in the bilateral upper extremities.  The tone in the lower extremities is normal.  Abnormal movements: There is abnormal tongue movement with a rare "sucking" noise made by the tongue in the mouth.  Does not extrude the tongue out of the mouth.  Very fine tremor of outstretched hands.  No tremor with intention.  No tremor when the hands are held in the proximal/wing beating position. Coordination:  There is no decremation with RAM's, with any form of RAMS, including alternating supination and pronation of the forearm, hand opening and closing, finger taps, heel taps and toe taps. Gait and Station: The patient has no difficulty arising out of a deep-seated chair without the use of the hands. The patient's stride length is normal.  He is able to ambulate in a tandem fashion.  DBS is performed today and described in more detail on separate programming procedure on out.  ASSESSMENT/PLAN:  1.  Tremor  -I am somewhat worried that this was medication induced and am not sure that he would have been inappropriate DBS candidate.  Per the  limited records I had, he was on lithium at the time of DBS surgery, which is a known common source of tremor.  He is now off of lithium.  I turned off his DBS and he did not have any reemergence of tremor.  The patient was very surprised by this.  We had a long discussion, as his battery is at end of life and the patient is not sure that he wants to put in a new battery if he does not need it.  He is still paying for the cost of the old battery.  He decided to leave his device off for 2 weeks and then we will call him and see if he had a reemergence of tremor.  If so, then we will schedule him for a battery change and see if he would be a candidate for a rechargeable device (I think he will be).  He would like to have less frequent battery changes.    -Because I have no records regarding his DBS, it is unclear whether this was supposed to be a bilateral VIM surgery, which is what would be typical for tremor.  According to a CT in 2009, leads were placed in the bilateral STN, which is generally the location of leads for Parkinson's disease.  2.  Mild oral buccal/tardive dyskinesia  -At this time and for the last many years he has been on multiple atypical antipsychotics (Zyprexa, Abilify, Rexulti) which can produce this symptom.  It can also produce tremor and even parkinsonism, but I did not really see those features today, but he and I did discuss these issues and he can talk about these with his psychiatrist.    3.  Peripheral neuropathy  -had EMG at [place] in 10/2015 demonstrating PN, felt likely due to EtOH abuse.  He has been sober for the last few years, but still has the neuropathy associated with alcohol.  4.  Tobacco abuse  -He and I talked about the importance of discontinuing tobacco for overall health and wellness.  5.  Much greater than 50% of this visit was spent in counseling and coordinating care.  Total face to face time:  60 min which did not include  DBS time of 25 min   Cc:   METHENEY,CATHERINE, MD

## 2016-06-14 ENCOUNTER — Ambulatory Visit (INDEPENDENT_AMBULATORY_CARE_PROVIDER_SITE_OTHER): Payer: Managed Care, Other (non HMO) | Admitting: Neurology

## 2016-06-14 ENCOUNTER — Encounter: Payer: Self-pay | Admitting: Neurology

## 2016-06-14 VITALS — BP 140/78 | HR 88 | Ht 72.0 in | Wt 194.0 lb

## 2016-06-14 DIAGNOSIS — G622 Polyneuropathy due to other toxic agents: Secondary | ICD-10-CM | POA: Diagnosis not present

## 2016-06-14 DIAGNOSIS — Z720 Tobacco use: Secondary | ICD-10-CM | POA: Diagnosis not present

## 2016-06-14 DIAGNOSIS — G2401 Drug induced subacute dyskinesia: Secondary | ICD-10-CM | POA: Diagnosis not present

## 2016-06-14 DIAGNOSIS — G251 Drug-induced tremor: Secondary | ICD-10-CM

## 2016-06-14 DIAGNOSIS — Z9689 Presence of other specified functional implants: Secondary | ICD-10-CM | POA: Diagnosis not present

## 2016-06-14 DIAGNOSIS — G250 Essential tremor: Secondary | ICD-10-CM | POA: Diagnosis not present

## 2016-06-14 NOTE — Procedures (Signed)
DBS Programming was performed.    Total time spent programming was 25 min minutes.  Device was confirmed to be on.  Soft start was confirmed to be on.  Impedences were checked and were within normal limits.  Battery was checked and was determined to be functioning normally but was near the end of life.  Final settings were as follows:  Left brain electrode:     3-C+           ; Amplitude  2.0   V   ; Pulse width 60 microseconds;   Frequency   170   Hz.  Right brain electrode:     8-11+          ; Amplitude   3.3  V ;  Pulse width 60  microseconds;  Frequency   170    Hz.

## 2016-06-21 ENCOUNTER — Other Ambulatory Visit: Payer: Self-pay | Admitting: Family Medicine

## 2016-06-23 ENCOUNTER — Encounter: Payer: Self-pay | Admitting: *Deleted

## 2016-06-23 NOTE — Telephone Encounter (Signed)
Please call Erline Levine at the [facility] and ask her about the brain stimulator. I don't think it will be an issue, but she will know for sure. Thanks

## 2016-06-23 NOTE — Telephone Encounter (Signed)
I have called and spoke with the pt and he is scheduled on 2/12/ at 2pm for the [facility].  Pt is aware of appt date and time.    Sarah everything has been scheduled for the pt, but as we were hanging up the pt stated that he has a deep brain  stimulator and has batteries in his chest and wanted to make sure that this would not be an issue with the ct scan.  Please advise. thanks

## 2016-06-24 NOTE — Telephone Encounter (Signed)
Called and spoke with Eddie Hernandez and she stated that this would not cause any issues with doing the CT scan.  Nothing further is needed.

## 2016-07-07 ENCOUNTER — Other Ambulatory Visit: Payer: Self-pay | Admitting: Family Medicine

## 2016-07-12 ENCOUNTER — Ambulatory Visit (INDEPENDENT_AMBULATORY_CARE_PROVIDER_SITE_OTHER): Payer: Managed Care, Other (non HMO) | Admitting: Acute Care

## 2016-07-12 ENCOUNTER — Ambulatory Visit (INDEPENDENT_AMBULATORY_CARE_PROVIDER_SITE_OTHER)
Admission: RE | Admit: 2016-07-12 | Discharge: 2016-07-12 | Disposition: A | Discharge status: Home / Self Care | Payer: Managed Care, Other (non HMO) | Source: Ambulatory Visit | Attending: Acute Care | Admitting: Acute Care

## 2016-07-12 ENCOUNTER — Encounter: Payer: Self-pay | Admitting: Acute Care

## 2016-07-12 DIAGNOSIS — Z87891 Personal history of nicotine dependence: Secondary | ICD-10-CM | POA: Diagnosis not present

## 2016-07-12 DIAGNOSIS — F17210 Nicotine dependence, cigarettes, uncomplicated: Secondary | ICD-10-CM | POA: Diagnosis not present

## 2016-07-12 NOTE — Progress Notes (Signed)
Shared Decision Making Visit Lung Cancer Screening Program [phone])   Eligibility:  Age 62 y.o.  Pack Years Smoking History Calculation 45-pack-year smoker (# packs/per year x # years smoked)  Recent History of coughing up blood  no  Unexplained weight loss? no ( >Than 15 pounds within the last 6 months )  Prior History Lung / other cancer no (Diagnosis within the last 5 years already requiring surveillance chest CT Scans).  Smoking Status Current Smoker  Former Smokers: Years since quit: NA  Quit Date: NA  Visit Components:  Discussion included one or more decision making aids. yes  Discussion included risk/benefits of screening. yes  Discussion included potential follow up diagnostic testing for abnormal scans. yes  Discussion included meaning and risk of over diagnosis. yes  Discussion included meaning and risk of False Positives. yes  Discussion included meaning of total radiation exposure. yes  Counseling Included:  Importance of adherence to annual lung cancer LDCT screening. yes  Impact of comorbidities on ability to participate in the program. yes  Ability and willingness to under diagnostic treatment. yes  Smoking Cessation Counseling:  Current Smokers:   Discussed importance of smoking cessation. yes  Information about tobacco cessation classes and interventions provided to patient. yes  Patient provided with "ticket" for LDCT Scan. yes  Symptomatic Patient. no  Counseling  Diagnosis Code: Tobacco Use Z72.0  Asymptomatic Patient yes  Counseling (Intermediate counseling: > three minutes counseling) [id number]  Former Smokers:   Discussed the importance of maintaining cigarette abstinence. yes  Diagnosis Code: Personal History of Nicotine Dependence. [id number]  Information about tobacco cessation classes and interventions provided to patient. Yes  Patient provided with "ticket" for LDCT Scan. yes  Written Order for Lung Cancer Screening with  LDCT placed in Epic. Yes (CT Chest Lung Cancer Screening Low Dose W/O CM) [id number] Z12.2-Screening of respiratory organs Z87.891-Personal history of nicotine dependence  I have spent 25 minutes of face to face time with Eddie Hernandez  discussing the risks and benefits of lung cancer screening. We viewed a power point together that explained in detail the above noted topics. We paused at intervals to allow for questions to be asked and answered to ensure understanding.We discussed that the single most powerful action that he can take to decrease his risk of developing lung cancer is to quit smoking. We discussed whether or not he is ready to commit to setting a quit date. He is currently not ready to set a quit date. We discussed options for tools to aid in quitting smoking including nicotine replacement therapy, non-nicotine medications, support groups, Quit Smart classes, and behavior modification. We discussed that often times setting smaller, more achievable goals, such as eliminating 1 cigarette a day for a week and then 2 cigarettes a day for a week can be helpful in slowly decreasing the number of cigarettes smoked. This allows for a sense of accomplishment as well as providing a clinical benefit. I gave him the " Be Stronger Than Your Excuses" card with contact information for community resources, classes, free nicotine replacement therapy, and access to mobile apps, text messaging, and on-line smoking cessation help. I have also given Eddie Hernandez my card and contact information in the event he needs to contact me. We discussed the time and location of the scan, and that either June Leap, CMA, or I will call with the results within 24-48 hours of receiving them. I have provided him with a copy of the power point we viewed  as  a resource in the event they need reinforcement of the concepts we discussed today in the office. The patient verbalized understanding of all of  the above and had no further  questions upon leaving the office. They have my contact information in the event they have any further questions.  I spent 3-4 minutes counseling against tobacco abuse, and encouraging the patient to quit smoking. We discussed the risks of continued tobacco abuse.  Magdalen Spatz, NP 07/12/2016

## 2016-07-13 ENCOUNTER — Telehealth: Payer: Self-pay | Admitting: Acute Care

## 2016-07-13 DIAGNOSIS — F17210 Nicotine dependence, cigarettes, uncomplicated: Secondary | ICD-10-CM

## 2016-07-13 NOTE — Telephone Encounter (Signed)
I attempted to call Mr. Eddie Hernandez with the results of his low-dose screening CT. There was no answer, and he told me that he screened his calls and for me to just leave a message. I left a detailed message explaining that his scan was read as a Lung RADS 2: nodules that are benign in appearance and behavior with a very low likelihood of becoming a clinically active cancer due to size or lack of growth. Recommendation per radiology is for a repeat LDCT in 12 months. I explained that we will order and schedule the scan for February 2019. I also explained that there was an incidental finding of coronary artery atherosclerosis and aortic atherosclerosis. I explained that this is very common with this exam. I did note that he is not currently on statin therapy, and I will fax a copy of the results of this scan to his primary care provider. I told him I would have his primary care provider follow-up as she felt was clinically indicated as she knew his health history well. I did stress that he needs to have his cholesterol and triglycerides checked on an annual basis to allow evaluation for statin therapy. I left my contact information in the event that Eddie Hernandez  needs to call with any additional questions regarding the results of his scan.

## 2016-07-21 ENCOUNTER — Other Ambulatory Visit: Payer: Self-pay | Admitting: *Deleted

## 2016-07-21 MED ORDER — ARIPIPRAZOLE 5 MG PO TABS: ORAL_TABLET | ORAL | 3 refills | Status: AC

## 2016-07-28 ENCOUNTER — Telehealth: Payer: Self-pay | Admitting: Neurology

## 2016-07-28 NOTE — Telephone Encounter (Signed)
Left message on machine for patient to call back.  To see if DBS is still turned off and how he is doing. Awaiting call  Back.

## 2016-07-28 NOTE — Telephone Encounter (Signed)
Sent mychart message to check on patient as well.

## 2016-07-29 ENCOUNTER — Encounter: Payer: Self-pay | Admitting: Neurology

## 2016-07-29 NOTE — Telephone Encounter (Signed)
Spoke with patient and he states he has not turned DBS back on and he is doing well.  He would like to talk to a surgeon about removal of the device.  Referral faxed to [facility] at [phone] with confirmation received. They will contact the patient to schedule.

## 2016-07-30 ENCOUNTER — Encounter: Payer: Self-pay | Admitting: Family Medicine

## 2016-07-30 ENCOUNTER — Other Ambulatory Visit: Payer: Self-pay | Admitting: Family Medicine

## 2016-07-30 MED ORDER — ATORVASTATIN CALCIUM 40 MG PO TABS: 40.0000 mg | ORAL_TABLET | Freq: Every day | ORAL | 3 refills | Status: DC

## 2016-07-30 NOTE — Progress Notes (Signed)
atorva

## 2016-08-11 LAB — HM COLONOSCOPY

## 2016-08-12 DIAGNOSIS — Z1211 Encounter for screening for malignant neoplasm of colon: Secondary | ICD-10-CM | POA: Insufficient documentation

## 2016-08-19 ENCOUNTER — Encounter: Payer: Self-pay | Admitting: Family Medicine

## 2016-08-25 ENCOUNTER — Other Ambulatory Visit: Payer: Self-pay

## 2016-08-25 ENCOUNTER — Other Ambulatory Visit: Payer: Self-pay | Admitting: Family Medicine

## 2016-08-25 DIAGNOSIS — E039 Hypothyroidism, unspecified: Secondary | ICD-10-CM

## 2016-08-25 LAB — TSH: TSH: 1.77 m[IU]/L (ref 0.40–4.50)

## 2016-08-25 MED ORDER — LEVOTHYROXINE SODIUM 175 MCG PO TABS: 175.0000 ug | ORAL_TABLET | Freq: Every day | ORAL | 0 refills | Status: DC

## 2016-08-26 NOTE — Progress Notes (Signed)
All labs are normal. 

## 2016-08-30 ENCOUNTER — Other Ambulatory Visit: Payer: Self-pay | Admitting: *Deleted

## 2016-08-30 MED ORDER — LEVOTHYROXINE SODIUM 175 MCG PO TABS: 175.0000 ug | ORAL_TABLET | Freq: Every day | ORAL | 2 refills | Status: DC

## 2016-09-02 ENCOUNTER — Ambulatory Visit: Payer: Managed Care, Other (non HMO) | Admitting: Family Medicine

## 2016-09-08 ENCOUNTER — Ambulatory Visit (INDEPENDENT_AMBULATORY_CARE_PROVIDER_SITE_OTHER): Payer: Managed Care, Other (non HMO) | Admitting: Family Medicine

## 2016-09-08 VITALS — BP 138/87 | HR 98 | Temp 98.7°F | Wt 189.0 lb

## 2016-09-08 DIAGNOSIS — J440 Chronic obstructive pulmonary disease with acute lower respiratory infection: Secondary | ICD-10-CM | POA: Diagnosis not present

## 2016-09-08 DIAGNOSIS — J019 Acute sinusitis, unspecified: Secondary | ICD-10-CM

## 2016-09-08 DIAGNOSIS — J209 Acute bronchitis, unspecified: Secondary | ICD-10-CM

## 2016-09-08 MED ORDER — PREDNISONE 20 MG PO TABS: 40.0000 mg | ORAL_TABLET | Freq: Every day | ORAL | 0 refills | Status: DC

## 2016-09-08 MED ORDER — AMOXICILLIN-POT CLAVULANATE 875-125 MG PO TABS: 1.0000 | ORAL_TABLET | Freq: Two times a day (BID) | ORAL | 0 refills | Status: DC

## 2016-09-08 NOTE — Progress Notes (Signed)
   Subjective:    Patient ID: Eddie Hernandez, male    DOB: 1955/08/24, 61 y.o.   MRN: [id number]  HPI 61 yo Male comes in today complaining of persistent cough and upper respiratory symptoms. He has a history of mild COPD and he is a current smoker. He has had persistent cough with yellow white sputum and significant nasal congestion with facial pressure and headache. He had had some mild intermittent fevers. He's been taking Mucinex and Robitussin. [facility]. A few days later he ended up going to the emergency department. At that point he had been sick for about a week. They tested him for the flu he was negative. He had normal chest x-ray. They sent him home with treatment for viral illness. Given [employer] which she says really hasn't helped. The cough has been keeping him awake at night.   Review of Systems     Objective:   Physical Exam  Constitutional: He is oriented to person, place, and time. He appears well-developed and well-nourished.  HENT:  Head: Normocephalic and atraumatic.  Right Ear: External ear normal.  Left Ear: External ear normal.  Nose: Nose normal.  Mouth/Throat: Oropharynx is clear and moist.  TMs and canals are clear.   Eyes: Conjunctivae and EOM are normal. Pupils are equal, round, and reactive to light.  Neck: Neck supple. No thyromegaly present.  Cardiovascular: Normal rate and normal heart sounds.   Pulmonary/Chest: Effort normal and breath sounds normal.  Diffuse rhonchi, expiratory 3 wheeze in the right upper anterior lung field.  Lymphadenopathy:    He has no cervical adenopathy.  Neurological: He is alert and oriented to person, place, and time.  Skin: Skin is warm and dry.  Psychiatric: He has a normal mood and affect.        Assessment & Plan:  Acute sinusitis/bronchitis-he's felt conservative therapy for 2 weeks. We'll go ahead and treat with Augmentin and because I am hearing some rhonchi in the  lungs today with his history of COPD I'm going to go ahead and treat him with 5 days of prednisone as well.  Her recommended that he come in for full spirometry this summer for further evaluation.

## 2016-09-10 ENCOUNTER — Telehealth: Payer: Self-pay | Admitting: Neurology

## 2016-09-10 NOTE — Telephone Encounter (Signed)
FYI: Dr. Carles Collet.

## 2016-09-10 NOTE — Telephone Encounter (Signed)
Haley from Dr Donald Pore office called and states patient is starting a new job and will hold off at this time coming to see them but will call back to get sch when he can thank you

## 2016-12-30 ENCOUNTER — Other Ambulatory Visit: Payer: Self-pay | Admitting: Family Medicine

## 2017-01-21 ENCOUNTER — Ambulatory Visit (INDEPENDENT_AMBULATORY_CARE_PROVIDER_SITE_OTHER): Payer: Self-pay

## 2017-01-21 ENCOUNTER — Encounter: Payer: Self-pay | Admitting: Family Medicine

## 2017-01-21 ENCOUNTER — Ambulatory Visit (INDEPENDENT_AMBULATORY_CARE_PROVIDER_SITE_OTHER): Payer: Self-pay | Admitting: Family Medicine

## 2017-01-21 VITALS — BP 160/106 | HR 91 | Temp 98.2°F

## 2017-01-21 DIAGNOSIS — R0781 Pleurodynia: Secondary | ICD-10-CM

## 2017-01-21 DIAGNOSIS — D649 Anemia, unspecified: Secondary | ICD-10-CM

## 2017-01-21 DIAGNOSIS — R0789 Other chest pain: Secondary | ICD-10-CM

## 2017-01-21 DIAGNOSIS — E039 Hypothyroidism, unspecified: Secondary | ICD-10-CM

## 2017-01-21 DIAGNOSIS — R0602 Shortness of breath: Secondary | ICD-10-CM

## 2017-01-21 LAB — TROPONIN I

## 2017-01-21 MED ORDER — LEVOTHYROXINE SODIUM 88 MCG PO TABS: 88.0000 ug | ORAL_TABLET | ORAL | 1 refills | Status: DC

## 2017-01-21 NOTE — Progress Notes (Signed)
Subjective:    Patient ID: Eddie Hernandez, male    DOB: Dec 10, 1955, 61 y.o.   MRN: [id number]  HPI 61 yo male with hx of IFG, hyperlipidemia  comes in today c/o of CP that started last night. Started while sleeping. Says it is a sharp pon along the right side of his sternun.  He says then this morning while at work it happened again. At the time he was not doing any heavy lifting etc.He denies any trauma to the chest wall. He denies any upper respiratory symptoms such as cough. He said what happened again it lasted about 10 minutes and describes it as sharp. Ever since then has just felt achy. He did feel a little bit short of breath with it. He has felt a little lightheaded. He took some Advil this morning not specifically for the chest pain but just for joint pain. He does take NSAIDs on a daily basis. No nausea or vomiting. He did eat breakfast this morning. He denies any recent heartburn or reflux symptoms. He is still a heavy smoker and he denies any family history of heart disease.  Hypothyroidism-he has been out of his 88 g tabs that he has been splitting his 175 g tabs. He knew he was due to come in for an appointment but unfortunately he switch jobs recently and has been without health insurance.   Review of Systems  There were no vitals taken for this visit.    Allergies  Allergen Reactions  . Brexpiprazole Other (See Comments)    Extreme fatigue, raised BP, SOB  . Sulfonamide Derivatives     REACTION: Rash    Past Medical History:  Diagnosis Date  . Alcohol abuse   . Bipolar 1 disorder ([place])   . Hypothyroidism   . Peripheral neuropathy ([facility])    small fiber  . Prostate pain   . Tremor     Past Surgical History:  Procedure Laterality Date  . DEEP BRAIN STIMULATOR PLACEMENT  01-30-08   tremors    Social History   Social History  . Marital status: Married    Spouse name: Maudry Mayhew   . Number of children: N/A  . Years of education: N/A   Occupational History  .  works in Scientist, research (medical).       [name]   Social History Main Topics  . Smoking status: Current Every Day Smoker    Packs/day: 1.00    Years: 45.00    Types: Cigarettes  . Smokeless tobacco: Never Used     Comment: Counseled to quit smoking  . Alcohol use No     Comment: hx of EtOH abuse  . Drug use: No  . Sexual activity: Not on file   Other Topics Concern  . Not on file   Social History Narrative   Works in Scientist, research (medical).  On his feet all day. No active exercise.     Family History  Problem Relation Age of Onset  . Stroke Father   . Alcoholism Father   . Cancer Sister        Lung     Outpatient Encounter Prescriptions as of 01/21/2017  Medication Sig  . ARIPiprazole (ABILIFY) 5 MG tablet TAKE ONE TABLET (5 MG TOTAL) BY MOUTH DAILY  . atorvastatin (LIPITOR) 40 MG tablet Take 1 tablet (40 mg total) by mouth daily.  Marland Kitchen b complex vitamins tablet Take 1 tablet by mouth daily.  . Cholecalciferol (VITAMIN D-3 PO) Take 1,000 mg by mouth.  Marland Kitchen  gabapentin (NEURONTIN) 100 MG capsule Take 100 mg by mouth 4 (four) times daily.  Marland Kitchen gabapentin (NEURONTIN) 300 MG capsule at bedtime.   Marland Kitchen ibuprofen (ADVIL,MOTRIN) 200 MG tablet Take 800 mg by mouth 2 (two) times daily.  Marland Kitchen levothyroxine (SYNTHROID, LEVOTHROID) 175 MCG tablet Take 1 tablet (175 mcg total) by mouth daily.  Derrill Memo ON 01/24/2017] levothyroxine (SYNTHROID, LEVOTHROID) 88 MCG tablet Take 1 tablet (88 mcg total) by mouth 2 (two) times a week.  . mirtazapine (REMERON SOL-TAB) 15 MG disintegrating tablet Take 15 mg by mouth daily.  . Multiple Vitamins-Minerals (MULTIVITAMIN PO) Take 1 capsule by mouth daily.  Marland Kitchen NUVIGIL 250 MG tablet Take 250 mg by mouth daily.  . protriptyline (VIVACTIL) 10 MG tablet Take 10 mg by mouth 2 (two) times daily.  . [DISCONTINUED] amoxicillin-clavulanate (AUGMENTIN) 875-125 MG tablet Take 1 tablet by mouth 2 (two) times daily.  . [DISCONTINUED] levothyroxine (SYNTHROID, LEVOTHROID) 88 MCG tablet Take 88 mcg by mouth  2 (two) times a week.  . [DISCONTINUED] predniSONE (DELTASONE) 20 MG tablet Take 2 tablets (40 mg total) by mouth daily.  . [DISCONTINUED] pyridoxine (B-6) 100 MG tablet Take 100 mg by mouth 2 (two) times daily.   No facility-administered encounter medications on file as of 01/21/2017.        Objective:   Physical Exam  Constitutional: He is oriented to person, place, and time. He appears well-developed and well-nourished.  HENT:  Head: Normocephalic and atraumatic.  Cardiovascular: Normal rate, regular rhythm and normal heart sounds.   Pulmonary/Chest: Effort normal and breath sounds normal.  Neurological: He is alert and oriented to person, place, and time.  Skin: Skin is warm and dry.  Psychiatric: He has a normal mood and affect. His behavior is normal.        Assessment & Plan:  Atypical chest pain-unclear etiology at this point. Consider cardiac versus GERD/gastritis since he does take daily NSAIDs. No tenderness on chest wall exam. Get EKG today.EKG today shows rate of 82 bpm, normal sinus rhythm with no acute ST-T wave changes.he is willing at least to the lab work and the chest x-ray. He is concerned about cost because he currently is without health insurance. Aneurysm is unlikely especially with his high blood pressure today. Her pressure actually was normal 4 months ago in April.  Hypothyroidism-due to recheck levels.will wait till he has insurance for now we'll go ahead and send any 8 g refills today.

## 2017-01-22 LAB — CBC WITH DIFFERENTIAL/PLATELET
Basophils Absolute: 73 cells/uL (ref 0–200)
Basophils Relative: 1 %
EOS ABS: 146 {cells}/uL (ref 15–500)
Eosinophils Relative: 2 %
HEMATOCRIT: 37.8 % — AB (ref 38.5–50.0)
HEMOGLOBIN: 12.9 g/dL — AB (ref 13.2–17.1)
LYMPHS ABS: 1752 {cells}/uL (ref 850–3900)
Lymphocytes Relative: 24 %
MCH: 31.5 pg (ref 27.0–33.0)
MCHC: 34.1 g/dL (ref 32.0–36.0)
MCV: 92.2 fL (ref 80.0–100.0)
MONO ABS: 511 {cells}/uL (ref 200–950)
MPV: 10.6 fL (ref 7.5–12.5)
Monocytes Relative: 7 %
NEUTROS ABS: 4818 {cells}/uL (ref 1500–7800)
NEUTROS PCT: 66 %
Platelets: 221 10*3/uL (ref 140–400)
RBC: 4.1 MIL/uL — ABNORMAL LOW (ref 4.20–5.80)
RDW: 13.2 % (ref 11.0–15.0)
WBC: 7.3 10*3/uL (ref 3.8–10.8)

## 2017-01-22 LAB — BASIC METABOLIC PANEL WITHOUT GFR
BUN: 15 mg/dL (ref 7–25)
CO2: 25 mmol/L (ref 20–32)
Calcium: 8.9 mg/dL (ref 8.6–10.3)
Chloride: 102 mmol/L (ref 98–110)
Creat: 0.89 mg/dL (ref 0.70–1.25)
GFR, Est African American: >89 mL/min
GFR, Est Non African American: >89 mL/min
Glucose, Bld: 99 mg/dL (ref 65–99)
Potassium: 4.8 mmol/L (ref 3.5–5.3)
Sodium: 137 mmol/L (ref 135–146)

## 2017-01-24 NOTE — Addendum Note (Signed)
Addended by: Teddy Spike on: 01/24/2017 07:48 AM   Modules accepted: Orders

## 2017-03-02 ENCOUNTER — Telehealth: Payer: Self-pay | Admitting: Family Medicine

## 2017-03-02 DIAGNOSIS — D649 Anemia, unspecified: Secondary | ICD-10-CM | POA: Diagnosis not present

## 2017-03-02 DIAGNOSIS — E039 Hypothyroidism, unspecified: Secondary | ICD-10-CM

## 2017-03-02 LAB — TSH: TSH: 0.74 m[IU]/L (ref 0.40–4.50)

## 2017-03-02 NOTE — Telephone Encounter (Signed)
Lab ordered and faxed.Eddie Hernandez Lynetta Pt called and advised via vm.Eddie Hernandez [place]

## 2017-03-02 NOTE — Telephone Encounter (Signed)
Pt came in and stated he called and asked to have labs put in for his thyroid. Can this be done. Thanks

## 2017-03-03 LAB — HEMOGLOBIN: Hemoglobin: 13.6 g/dL (ref 13.2–17.1)

## 2017-03-07 ENCOUNTER — Other Ambulatory Visit: Payer: Self-pay | Admitting: Family Medicine

## 2017-03-27 ENCOUNTER — Other Ambulatory Visit: Payer: Self-pay | Admitting: Family Medicine

## 2017-04-15 ENCOUNTER — Ambulatory Visit (INDEPENDENT_AMBULATORY_CARE_PROVIDER_SITE_OTHER): Payer: BLUE CROSS/BLUE SHIELD | Admitting: Family Medicine

## 2017-04-15 ENCOUNTER — Encounter: Payer: Self-pay | Admitting: Family Medicine

## 2017-04-15 VITALS — BP 140/78 | HR 96 | Ht 72.0 in | Wt 200.0 lb

## 2017-04-15 DIAGNOSIS — R7301 Impaired fasting glucose: Secondary | ICD-10-CM

## 2017-04-15 DIAGNOSIS — I10 Essential (primary) hypertension: Secondary | ICD-10-CM | POA: Diagnosis not present

## 2017-04-15 DIAGNOSIS — E039 Hypothyroidism, unspecified: Secondary | ICD-10-CM | POA: Diagnosis not present

## 2017-04-15 DIAGNOSIS — G609 Hereditary and idiopathic neuropathy, unspecified: Secondary | ICD-10-CM

## 2017-04-15 DIAGNOSIS — Z23 Encounter for immunization: Secondary | ICD-10-CM | POA: Diagnosis not present

## 2017-04-15 LAB — POCT GLYCOSYLATED HEMOGLOBIN (HGB A1C): Hemoglobin A1C: 6

## 2017-04-15 MED ORDER — LISINOPRIL 10 MG PO TABS: 10.0000 mg | ORAL_TABLET | Freq: Every day | ORAL | 1 refills | Status: DC

## 2017-04-15 MED ORDER — LEVOTHYROXINE SODIUM 150 MCG PO TABS: 150.0000 ug | ORAL_TABLET | Freq: Every day | ORAL | 1 refills | Status: DC

## 2017-04-15 NOTE — Patient Instructions (Signed)
Can consider increase on the gabapentin or even switching to Lyrica.  Cymbalta is also FDA approved for peripheral neuropathy as well.  May be talk with Dr. Jimmye Norman about some options. We will send over new prescription for your levothyroxine.  Take half a tab 1 day a week and a whole tab the other 6 days and repeat.  Plan to recheck TSH in 6 weeks.

## 2017-04-15 NOTE — Progress Notes (Signed)
Subjective:    CC: IFG, thyroid   HPI:  Impaired fasting glucose-no increased thirst or urination. No symptoms consistent with hypoglycemia.  Hypothyroidism-no recent skin or hair changes or weight loss.  He actually feels like his neuropathy is a little bit better on the days after he takes a lower strength of the medication.  He takes 88 mcg 2 days a week and 175 mcg 5 days a week.  He feels like his peripheral neuropathy is getting worse.  He says before just his toes were numb now he feels like the whole bottom part of his foot is numb and he gets intermittent sharp pains that literally stop him from walking.  He says anytime he walks about 100 feet it becomes very painful.  He is already on gabapentin 300 mg 3 times a day.    Past medical history, Surgical history, Family history not pertinant except as noted below, Social history, Allergies, and medications have been entered into the medical record, reviewed, and corrections made.   Review of Systems: No fevers, chills, night sweats, weight loss, chest pain, or shortness of breath.   Objective:    General: Well Developed, well nourished, and in no acute distress.  Neuro: Alert and oriented x3, extra-ocular muscles intact, sensation grossly intact.  HEENT: Normocephalic, atraumatic  Skin: Warm and dry, no rashes. Cardiac: Regular rate and rhythm, no murmurs rubs or gallops, no lower extremity edema.  Respiratory: Clear to auscultation bilaterally. Not using accessory muscles, speaking in full sentences.   Impression and Recommendations:    IFG -globin A1c up to 6.0 which is increased from previous of 5.6, 6 months ago.  Hypothyroid -recent TSH was 0.7.  Goal is between 1 and 2.  Plan to decrease dose down to 150 mcg daily but 1 day a week he will actually just take a half of a tab which will lower his total dose for the week by 75 mcg.  Plan to recheck TSH in about 6 weeks.  Peripheral neuropathy-symptoms are getting  progressively worse.  Discussed that he could try increasing his gabapentin.  He can speak with Dr. Jimmye Norman who writes his prescription for him as this is his psychiatrist.  Did also look at the option of Lyrica though he may be using this for antiseizure purposes as well.  And also consider a trial of Cymbalta.  Write these drugs down for him to discuss with Dr. Jimmye Norman.  Hypertension-new diagnosis.  His blood pressure has been high the last several times he has been here.  It did come down upon recheck but still elevated.  Recommend starting a trial lisinopril 10 mg daily.  Follow-up in about 4-6 weeks for repeat blood pressure check.  Flu vaccine given today.

## 2017-05-04 DIAGNOSIS — F3181 Bipolar II disorder: Secondary | ICD-10-CM | POA: Diagnosis not present

## 2017-05-04 DIAGNOSIS — F1020 Alcohol dependence, uncomplicated: Secondary | ICD-10-CM | POA: Diagnosis not present

## 2017-06-29 ENCOUNTER — Other Ambulatory Visit: Payer: Self-pay | Admitting: *Deleted

## 2017-06-29 DIAGNOSIS — E039 Hypothyroidism, unspecified: Secondary | ICD-10-CM

## 2017-06-29 MED ORDER — LEVOTHYROXINE SODIUM 150 MCG PO TABS: 150.0000 ug | ORAL_TABLET | Freq: Every day | ORAL | 3 refills | Status: DC

## 2017-09-01 ENCOUNTER — Ambulatory Visit: Payer: BLUE CROSS/BLUE SHIELD

## 2017-09-01 DIAGNOSIS — F17210 Nicotine dependence, cigarettes, uncomplicated: Secondary | ICD-10-CM

## 2017-09-06 ENCOUNTER — Other Ambulatory Visit: Payer: Self-pay | Admitting: Acute Care

## 2017-09-06 DIAGNOSIS — F17210 Nicotine dependence, cigarettes, uncomplicated: Principal | ICD-10-CM

## 2017-09-06 DIAGNOSIS — Z122 Encounter for screening for malignant neoplasm of respiratory organs: Secondary | ICD-10-CM

## 2017-09-08 ENCOUNTER — Telehealth: Payer: Self-pay | Admitting: Family Medicine

## 2017-09-08 ENCOUNTER — Encounter: Payer: Self-pay | Admitting: Family Medicine

## 2017-09-08 ENCOUNTER — Ambulatory Visit (INDEPENDENT_AMBULATORY_CARE_PROVIDER_SITE_OTHER): Payer: BLUE CROSS/BLUE SHIELD

## 2017-09-08 ENCOUNTER — Ambulatory Visit (INDEPENDENT_AMBULATORY_CARE_PROVIDER_SITE_OTHER): Payer: BLUE CROSS/BLUE SHIELD | Admitting: Family Medicine

## 2017-09-08 VITALS — BP 133/71 | HR 89 | Temp 98.7°F | Resp 16 | Ht 72.0 in | Wt 188.7 lb

## 2017-09-08 DIAGNOSIS — M25551 Pain in right hip: Secondary | ICD-10-CM

## 2017-09-08 DIAGNOSIS — M1611 Unilateral primary osteoarthritis, right hip: Secondary | ICD-10-CM | POA: Diagnosis not present

## 2017-09-08 DIAGNOSIS — E781 Pure hyperglyceridemia: Secondary | ICD-10-CM

## 2017-09-08 DIAGNOSIS — I10 Essential (primary) hypertension: Secondary | ICD-10-CM

## 2017-09-08 DIAGNOSIS — I2510 Atherosclerotic heart disease of native coronary artery without angina pectoris: Secondary | ICD-10-CM | POA: Diagnosis not present

## 2017-09-08 DIAGNOSIS — Z720 Tobacco use: Secondary | ICD-10-CM

## 2017-09-08 DIAGNOSIS — R7301 Impaired fasting glucose: Secondary | ICD-10-CM

## 2017-09-08 DIAGNOSIS — G8929 Other chronic pain: Secondary | ICD-10-CM | POA: Diagnosis not present

## 2017-09-08 DIAGNOSIS — E039 Hypothyroidism, unspecified: Secondary | ICD-10-CM

## 2017-09-08 NOTE — Telephone Encounter (Signed)
Call patient: Needs spirometry performed.  This is a breathing test to better evaluate his emphysema seen on his CT scan.  Try to schedule sometime next month.

## 2017-09-08 NOTE — Progress Notes (Signed)
Subjective:    CC: glucose and thyroid and BP  HPI:  Impaired fasting glucose-no increased thirst or urination. No symptoms consistent with hypoglycemia.  Hypertension- Pt denies chest pain, SOB, dizziness, or heart palpitations.  Taking meds as directed w/o problems.  Denies medication side effects.    Hypertriglyceridemia-due to recheck lipid panel.  Was also noted to have some coronary artery disease in the LAD on his CT.  Hypothyroidism-taking medication regularly without any side effects or problems.  We had adjusted his dose at last office visit.  Tobacco abuse-he wants to try vaping instead of cigarette smoking.  He wants to know what I thought about that.  He also c/o of right hip pain just along the groin crease.  He says he has had problems with 1 of his hips before before and had an injection at one point in time that was helpful.  He says this pain is similar.  It has been going on for several months.  Is already on gabapentin.  No recent injury or trauma.  He is not been sleeping well at night.  He says usually go to bed and do okay but then wake up around midnight and then be awake for a couple hours to the point where he finally gets up and eats and usually smokes.  He is wondering if it could actually be a side effect of the gabapentin.  F/U Lung CT scan:   CT CHEST LUNG CANCER SCREENING LOW DOSE WO CONTRAST (Accession [id number]) (Order [id number])  Imaging  Date: 09/01/2017 Department: [facility] Released By: Starleen Blue Authorizing: Magdalen Spatz, NP  Exam Information   Status Exam Begun  Exam Ended   Final [99] 09/01/2017 9:55 AM 09/01/2017 10:03 AM  PACS Images   Show images for CT CHEST LUNG CANCER SCREENING LOW DOSE WO CONTRAST  Study Result   CLINICAL DATA:  62 year old asymptomatic male current smoker with 45 pack-year smoking history.  EXAM: CT CHEST WITHOUT CONTRAST LOW-DOSE FOR LUNG CANCER  SCREENING  TECHNIQUE: Multidetector CT imaging of the chest was performed following the standard protocol without IV contrast.  COMPARISON:  07/12/2016 screening chest CT.  FINDINGS: Cardiovascular: Normal heart size. No significant pericardial fluid/thickening. Left anterior descending coronary atherosclerosis. Atherosclerotic nonaneurysmal thoracic aorta. Normal caliber pulmonary arteries.  Mediastinum/Nodes: No discrete thyroid nodules. Unremarkable esophagus. No pathologically enlarged axillary, mediastinal or gross hilar lymph nodes, noting limited sensitivity for the detection of hilar adenopathy on this noncontrast study.  Lungs/Pleura: No pneumothorax. No pleural effusion. Mild centrilobular emphysema. No acute consolidative airspace disease or lung masses. None of the previously visualized scattered small pulmonary nodules has demonstrated significant interval growth. No new significant pulmonary nodules.  Upper abdomen: No acute abnormality.  Musculoskeletal: No aggressive appearing focal osseous lesions. Stable mild symmetric gynecomastia. Stimulator device in superficial ventral upper right chest wall demonstrates a lead coursing superiorly into the right neck with the tip not seen on this study. Mild thoracic spondylosis.  IMPRESSION: 1. Lung-RADS 2, benign appearance or behavior. Continue annual screening with low-dose chest CT without contrast in 12 months. 2. One vessel coronary atherosclerosis.  Aortic Atherosclerosis (ICD10-I70.0) and Emphysema (ICD10-J43.9).   Electronically Signed   By: Ilona Sorrel M.D.   On: 09/01/2017 13:41   Result History   CT CHEST LUNG CANCER SCREENING LOW DOSE WO CONTRAST (Order #[id number]) on 09/01/2017 - Order Result History Report  Result Notes for CT CHEST LUNG CANCER SCREENING LOW DOSE WO CONTRAST  Notes recorded by Christie Beckers, RN on 09/06/2017 at 9:53 AM EDT Pt informed of CT results per Eric Form,  NP. PT verbalized understanding. Copy sent to PCP. Order placed for 1 yr f/u CT.  ------  Notes recorded by Magdalen Spatz, NP on 09/05/2017 at 5:14 PM EDT Please call patient and let them know their low dose Ct was read as a Lung RADS 2: nodules that are benign in appearance and behavior with a very low likelihood of becoming a clinically active cancer due to size or lack of growth. Recommendation per radiology is for a repeat LDCT in 12 months..Please let them know we will order and schedule their annual screening scan for 08/2018. Please let them know there was notation of CAD on their scan. Please remind the patient that this is a non-gated exam therefore degree or severity of disease cannot be determined. Please have them follow up with their PCP regarding potential risk factor modification, dietary therapy or pharmacologic therapy if clinically indicated. Pt. is not currently on statin therapy. Please place order for annual screening scan for 08/2018 and fax results to PCP. Thanks so much.     Past medical history, Surgical history, Family history not pertinant except as noted below, Social history, Allergies, and medications have been entered into the medical record, reviewed, and corrections made.   Review of Systems: No fevers, chills, night sweats, weight loss, chest pain, or shortness of breath.  Review of systems otherwise negative except for HPI.  Objective:    General: Well Developed, well nourished, and in no acute distress.  Neuro: Alert and oriented x3, extra-ocular muscles intact, sensation grossly intact.  HEENT: Normocephalic, atraumatic, no significant cervical lymphadenopathy or thyromegaly. Skin: Warm and dry, no rashes. Cardiac: Regular rate and rhythm, no murmurs rubs or gallops, no lower extremity edema.  No carotid bruits. Respiratory: Clear to auscultation bilaterally. Not using accessory muscles, speaking in full sentences.   Impression and  Recommendations:   IFG -well controlled.  Hemoglobin A1c 5.4 which looks fantastic.  He is cut out milk shakes and putting chocolate syrup in his coffee.  HTN - Well controlled. Continue current regimen. Follow up in  27months.    CAD seen in imaging.  Discussed adding a statin.  He says he will think about it.  Consider starting atorvastatin.  Hypertriglyceridemia -due to recheck lipids.  Hypothyroidism-due to recheck TSH.  Chronic leg pain-up to 700 mg 3 times daily on the gabapentin.  Tobacco abuse -okay to try vaping if he is actually going to use it to wean down on the amount of nicotine that he is using.  Otherwise explained that there are no studies showing that it is safer than regular cigarette smoking so I just want him to be aware of that.  Insomnia - suspect may be the cymbalta dosing the insomnia.  May want to speak with his provider that writes that for him.  Right hip pain-we will start with x-ray and have him follow-up with 1 of our sports medicine providers for further evaluation.  Emphysema/COPD-noted on the CT.  He alreadt had a diagnosis on his chart of mild COPD from several years ago.  He is been a heavy smoker for years.  I like to have him come back and schedule for spirometry in about a month.

## 2017-09-08 NOTE — Patient Instructions (Signed)
Think about starting a statin at bedtime to help reduce the risk of cardiac disease.

## 2017-09-09 ENCOUNTER — Telehealth: Payer: Self-pay

## 2017-09-09 MED ORDER — ATORVASTATIN CALCIUM 20 MG PO TABS: 20.0000 mg | ORAL_TABLET | Freq: Every day | ORAL | 3 refills | Status: DC

## 2017-09-09 NOTE — Telephone Encounter (Signed)
Pt advised.

## 2017-09-09 NOTE — Telephone Encounter (Signed)
Pt advised and transferred to scheduler for appointment.

## 2017-09-09 NOTE — Telephone Encounter (Signed)
OK, new rx sent.  

## 2017-09-09 NOTE — Telephone Encounter (Signed)
Tashan agreed to try Lipitor. He would like it sent to [facility].

## 2017-09-21 ENCOUNTER — Other Ambulatory Visit: Payer: Self-pay | Admitting: Family Medicine

## 2017-09-21 DIAGNOSIS — E039 Hypothyroidism, unspecified: Secondary | ICD-10-CM

## 2017-09-22 ENCOUNTER — Ambulatory Visit (INDEPENDENT_AMBULATORY_CARE_PROVIDER_SITE_OTHER): Payer: BLUE CROSS/BLUE SHIELD | Admitting: Family Medicine

## 2017-09-22 ENCOUNTER — Encounter: Payer: Self-pay | Admitting: Family Medicine

## 2017-09-22 VITALS — BP 140/67 | HR 93 | Temp 98.3°F | Wt 190.0 lb

## 2017-09-22 VITALS — BP 129/67 | HR 93

## 2017-09-22 DIAGNOSIS — F17200 Nicotine dependence, unspecified, uncomplicated: Secondary | ICD-10-CM | POA: Diagnosis not present

## 2017-09-22 DIAGNOSIS — M25551 Pain in right hip: Secondary | ICD-10-CM | POA: Diagnosis not present

## 2017-09-22 DIAGNOSIS — M79604 Pain in right leg: Secondary | ICD-10-CM

## 2017-09-22 DIAGNOSIS — I700 Atherosclerosis of aorta: Secondary | ICD-10-CM | POA: Insufficient documentation

## 2017-09-22 DIAGNOSIS — J449 Chronic obstructive pulmonary disease, unspecified: Secondary | ICD-10-CM | POA: Diagnosis not present

## 2017-09-22 DIAGNOSIS — E039 Hypothyroidism, unspecified: Secondary | ICD-10-CM | POA: Diagnosis not present

## 2017-09-22 DIAGNOSIS — J432 Centrilobular emphysema: Secondary | ICD-10-CM | POA: Insufficient documentation

## 2017-09-22 DIAGNOSIS — E781 Pure hyperglyceridemia: Secondary | ICD-10-CM | POA: Diagnosis not present

## 2017-09-22 LAB — LIPID PANEL W/REFLEX DIRECT LDL
CHOL/HDL RATIO: 2.8 (calc) (ref <5.0)
CHOLESTEROL: 115 mg/dL (ref <200)
HDL: 41 mg/dL (ref >40)
LDL Cholesterol (Calc): 53 mg/dL (calc)
Non-HDL Cholesterol (Calc): 74 mg/dL (calc) (ref <130)
TRIGLYCERIDES: 120 mg/dL (ref <150)

## 2017-09-22 LAB — TSH: TSH: 0.48 mIU/L (ref 0.40–4.50)

## 2017-09-22 MED ORDER — ALBUTEROL SULFATE HFA 108 (90 BASE) MCG/ACT IN AERS: 2.0000 | INHALATION_SPRAY | Freq: Four times a day (QID) | RESPIRATORY_TRACT | 1 refills | Status: DC | PRN

## 2017-09-22 NOTE — Progress Notes (Signed)
   Subjective:    Patient ID: Eddie Hernandez, male    DOB: 01/18/1956, 62 y.o.   MRN: [id number]  HPI 62 year old male comes in today to follow-up on recent CT scan that was performed for lung cancer screening.  It did show some centrilobular emphysema.  So I had him come in for spirometry.  He has never done this before.  He has been a heavy smoker most of his adult life.  He did recently switch to vaping about 2 weeks ago and says he is [name] noticed an improvement in his chronic cough.  Though the vape does have nicotine in it.  His plan is to slowly wean down on the nicotine content.  He is hopeful that this will help him successfully quit.   Review of Systems     Objective:   Physical Exam  Constitutional: He is oriented to person, place, and time. He appears well-developed and well-nourished.  HENT:  Head: Normocephalic and atraumatic.  Eyes: Conjunctivae and EOM are normal.  Cardiovascular: Normal rate.  Pulmonary/Chest: Effort normal.  Neurological: He is alert and oriented to person, place, and time.  Skin: Skin is dry. No pallor.  Psychiatric: He has a normal mood and affect. His behavior is normal.  Vitals reviewed.      Assessment & Plan:  Centrilobular emphysema/Group  B-spirometry today shows FVC of 100%, FEV1 of 89% with a ratio of 67%.  No significant improvement after albuterol.  Of note his curves were abnormal which makes it difficult to interpret his results.  But I did review these with him.  Encouraged him to continue work on smoking cessation.  Would like for him to have an albuterol inhaler to use as needed.  He says he is used one in the past before.  This would most consistent with mild COPD.  Recommend repeat spirometry in 1 year.  I did have him complete a CAT Score questionnaire.  On group B categorization he really should start an long-acting anticholinergic but because of the inaccuracy of his curve on the spirometry were to just start by giving him an  albuterol inhaler to use as needed and have him follow-up.

## 2017-09-22 NOTE — Patient Instructions (Signed)
Thank you for coming in today. Call or go to the ER if you develop a large red swollen joint with extreme pain or oozing puss.  You should hear about the blood flow test in the leg soon.  Recheck in 4 weeks.    Hip Pain The hip is the joint between the upper legs and the lower pelvis. The bones, cartilage, tendons, and muscles of your hip joint support your body and allow you to move around. Hip pain can range from a minor ache to severe pain in one or both of your hips. The pain may be felt on the inside of the hip joint near the groin, or the outside near the buttocks and upper thigh. You may also have swelling or stiffness. Follow these instructions at home: Managing pain, stiffness, and swelling  If directed, apply ice to the injured area. ? Put ice in a plastic bag. ? Place a towel between your skin and the bag. ? Leave the ice on for 20 minutes, 2-3 times a day  Sleep with a pillow between your legs on your most comfortable side.  Avoid any activities that cause pain. General instructions  Take over-the-counter and prescription medicines only as told by your health care provider.  Do any exercises as told by your health care provider.  Record the following: ? How often you have hip pain. ? The location of your pain. ? What the pain feels like. ? What makes the pain worse.  Keep all follow-up visits as told by your health care provider. This is important. Contact a health care provider if:  You cannot put weight on your leg.  Your pain or swelling continues or gets worse after one week.  It gets harder to walk.  You have a fever. Get help right away if:  You fall.  You have a sudden increase in pain and swelling in your hip.  Your hip is red or swollen or very tender to touch. Summary  Hip pain can range from a minor ache to severe pain in one or both of your hips.  The pain may be felt on the inside of the hip joint near the groin, or the outside near the  buttocks and upper thigh.  Avoid any activities that cause pain.  Record how often you have hip pain, the location of the pain, what makes it worse and what it feels like. This information is not intended to replace advice given to you by your health care provider. Make sure you discuss any questions you have with your health care provider. Document Released: 11/04/2009 Document Revised: 04/19/2016 Document Reviewed: 04/19/2016 Elsevier Interactive Patient Education  [employer].

## 2017-09-22 NOTE — Progress Notes (Signed)
Subjective:    I'm seeing this patient as a consultation for:  Eddie Marry, MD   CC: Right hip pain  HPI:  [name] notes a several month history of right anterior hip pain.  He notes the pain is worse with prolonged standing.  Additionally he notes the pain is worse with hip motion.  Additionally he notes pain in his thigh and lower leg with prolonged walking that improves rapidly with standing and resting.  Symptoms have been ongoing for months.  In the past he has had what sounds like intra-articular hip injections that have helped at orthopedics.  He was seen by his primary care provider on April 11 and she obtain an x-ray of his pelvis showing degenerative changes and hips bilaterally.  Past medical history, Surgical history, Family history not pertinant except as noted below, Social history, Allergies, and medications have been entered into the medical record, reviewed, and no changes needed.   Review of Systems: No headache, visual changes, nausea, vomiting, diarrhea, constipation, dizziness, abdominal pain, skin rash, fevers, chills, night sweats, weight loss, swollen lymph nodes, body aches, joint swelling, muscle aches, chest pain, shortness of breath, mood changes, visual or auditory hallucinations.   Objective:    Vitals:   09/22/17 1253  BP: 140/67  Pulse: 93  Temp: 98.3 F (36.8 C)  SpO2: 99%   General: Well Developed, well nourished, and in no acute distress.  Neuro/Psych: Alert and oriented x3, extra-ocular muscles intact, able to move all 4 extremities, sensation grossly intact. Skin: Warm and dry, no rashes noted.  Respiratory: Not using accessory muscles, speaking in full sentences, trachea midline.  Cardiovascular: Pulses palpable, no extremity edema. Abdomen: Does not appear distended. MSK:  Right hip normal-appearing nontender. Decreased external and internal rotation due to pain. Decreased decreased flexion due to pain Strength is slightly  diminished with hip abduction otherwise normal.  Procedure: Real-time Ultrasound Guided Injection of right hip joint Device: GE Logiq E   Images permanently stored and available for review in the ultrasound unit. Verbal informed consent obtained.  Discussed risks and benefits of procedure. Warned about infection bleeding damage to structures skin hypopigmentation and fat atrophy among others. Patient expresses understanding and agreement Time-out conducted.   Noted no overlying erythema, induration, or other signs of local infection.   Skin prepped in a sterile fashion.   Local anesthesia: Topical Ethyl chloride.   With sterile technique and under real time ultrasound guidance:  80 mg of Kenalog and 4 mL of Marcaine injected easily.   Completed without difficulty   Pain partially resolved suggesting accurate placement of the medication.   Advised to call if fevers/chills, erythema, induration, drainage, or persistent bleeding.   Images permanently stored and available for review in the ultrasound unit.  Impression: Technically successful ultrasound guided injection.   EXAM: DG HIP (WITH OR WITHOUT PELVIS) 2-3V RIGHT  COMPARISON:  CT 09/01/2017.  FINDINGS: Degenerative changes lumbar spine and both hips. No acute bony or joint abnormality. No evidence of fracture or dislocation. Tiny sclerotic foci in the right iliac wing and right pubis most likely bone islands. Pelvic calcifications consistent phleboliths.  IMPRESSION: Degenerative changes lumbar spine and both hips. No acute abnormality identified.   Electronically Signed   By: Marcello Moores  Register   On: 09/09/2017 08:53 I personally (independently) visualized and performed the interpretation of the images attached in this note.   Impression and Recommendations:    Assessment and Plan: 62 y.o. male with  Right hip pain.  The hip pain is likely multifactorial.  Patient does have degenerative changes on x-ray and has  right worse than left limited range of motion in the right hip.  However he had incomplete response of his pain following intra-articular injection.  Some of the pain may be hip flexor tendinopathy or perhaps some component of hip abductor tendinitis.  Regardless the injection may help going forwards.  However patient described them times concerning for claudication.  He has aortic atherosclerosis seen on CT scan of the chest from earlier this month.  Additionally he has a history of coronary artery disease.  Additionally he is a current tobacco user.  Plan for ABI ultrasound and follow-up in 1 month.    Discussed warning signs or symptoms. Please see discharge instructions. Patient expresses understanding.

## 2017-09-23 ENCOUNTER — Telehealth: Payer: Self-pay | Admitting: *Deleted

## 2017-09-23 ENCOUNTER — Telehealth: Payer: Self-pay

## 2017-09-23 NOTE — Telephone Encounter (Signed)
Eddie Hernandez called and states he is not going to pick up the albuterol. He doesn't feel he needs the medication.

## 2017-09-23 NOTE — Telephone Encounter (Signed)
Pt advised.

## 2017-09-23 NOTE — Telephone Encounter (Signed)
OK, doesn't have to.

## 2017-09-23 NOTE — Telephone Encounter (Signed)
While pt was here yesterday he stated that he wanted Dr. Madilyn Fireman to send in Gabapentin 100 mg for him. He stated that on his medication list the dosage was incorrect. And he would need the 100 mg sent to [facility] on [address] in [place].  I spoke to Dr. Madilyn Fireman about this and she said that she does not write this for him so he would need to speak to his neurologist or psychiatrist for the prescription. Maryruth Eve, [place] Crocker, CMA

## 2017-10-03 ENCOUNTER — Other Ambulatory Visit: Payer: Self-pay | Admitting: Family Medicine

## 2017-10-03 DIAGNOSIS — M79604 Pain in right leg: Secondary | ICD-10-CM

## 2017-10-03 DIAGNOSIS — I739 Peripheral vascular disease, unspecified: Secondary | ICD-10-CM

## 2017-10-04 ENCOUNTER — Other Ambulatory Visit: Payer: Self-pay | Admitting: Family Medicine

## 2017-10-05 ENCOUNTER — Ambulatory Visit (HOSPITAL_COMMUNITY)
Admission: RE | Admit: 2017-10-05 | Discharge: 2017-10-05 | Disposition: A | Discharge status: Home / Self Care | Payer: BLUE CROSS/BLUE SHIELD | Source: Ambulatory Visit | Attending: Cardiology | Admitting: Cardiology

## 2017-10-05 DIAGNOSIS — I70292 Other atherosclerosis of native arteries of extremities, left leg: Secondary | ICD-10-CM | POA: Insufficient documentation

## 2017-10-05 DIAGNOSIS — M79604 Pain in right leg: Secondary | ICD-10-CM | POA: Insufficient documentation

## 2017-10-05 DIAGNOSIS — I2510 Atherosclerotic heart disease of native coronary artery without angina pectoris: Secondary | ICD-10-CM | POA: Insufficient documentation

## 2017-10-05 DIAGNOSIS — Z87891 Personal history of nicotine dependence: Secondary | ICD-10-CM | POA: Insufficient documentation

## 2017-10-05 DIAGNOSIS — I70203 Unspecified atherosclerosis of native arteries of extremities, bilateral legs: Secondary | ICD-10-CM | POA: Insufficient documentation

## 2017-10-05 DIAGNOSIS — I739 Peripheral vascular disease, unspecified: Secondary | ICD-10-CM

## 2017-10-05 DIAGNOSIS — I10 Essential (primary) hypertension: Secondary | ICD-10-CM | POA: Insufficient documentation

## 2017-10-07 ENCOUNTER — Encounter: Payer: Self-pay | Admitting: Family Medicine

## 2017-10-07 ENCOUNTER — Telehealth: Payer: Self-pay | Admitting: Family Medicine

## 2017-10-07 DIAGNOSIS — I739 Peripheral vascular disease, unspecified: Secondary | ICD-10-CM

## 2017-10-07 NOTE — Telephone Encounter (Signed)
Patient has been informed of the results. Rhonda Cunningham,CMA

## 2017-10-07 NOTE — Telephone Encounter (Signed)
Patient does have narrowing of the blood vessels in the leg.  The cardiologist that read recommends consultation with cardiology in clinic.  I have put a referral in

## 2017-10-10 ENCOUNTER — Other Ambulatory Visit: Payer: Self-pay | Admitting: *Deleted

## 2017-10-10 DIAGNOSIS — E039 Hypothyroidism, unspecified: Secondary | ICD-10-CM

## 2017-10-10 MED ORDER — LEVOTHYROXINE SODIUM 150 MCG PO TABS: 150.0000 ug | ORAL_TABLET | Freq: Every day | ORAL | 1 refills | Status: DC

## 2017-10-11 ENCOUNTER — Telehealth: Payer: Self-pay | Admitting: Emergency Medicine

## 2017-10-11 DIAGNOSIS — I739 Peripheral vascular disease, unspecified: Secondary | ICD-10-CM

## 2017-10-11 DIAGNOSIS — I700 Atherosclerosis of aorta: Secondary | ICD-10-CM

## 2017-10-11 NOTE — Telephone Encounter (Signed)
Pt advised.

## 2017-10-11 NOTE — Telephone Encounter (Signed)
Patient calls requesting name/number of vascular specialist Dr.Corey suggested; he has not heard from them and his discomfort is such that he wants dx and tx.

## 2017-10-11 NOTE — Telephone Encounter (Signed)
[facility] cardiology [facility] please contact cardiology office and see if they called him yet.

## 2017-10-12 DIAGNOSIS — Z720 Tobacco use: Secondary | ICD-10-CM | POA: Diagnosis not present

## 2017-10-12 DIAGNOSIS — I739 Peripheral vascular disease, unspecified: Secondary | ICD-10-CM | POA: Diagnosis not present

## 2017-10-12 DIAGNOSIS — I10 Essential (primary) hypertension: Secondary | ICD-10-CM | POA: Diagnosis not present

## 2017-10-12 DIAGNOSIS — R739 Hyperglycemia, unspecified: Secondary | ICD-10-CM | POA: Diagnosis not present

## 2017-10-12 NOTE — Telephone Encounter (Signed)
Pt walked into clinic today requesting an urgent referral to [facility]ology. He states this is because he hasn't heard from [facility]. Referral pended for approval.

## 2017-10-12 NOTE — Telephone Encounter (Signed)
Per referral notes    10/11/17 patient called to schedule appt, declined next available, states he will call back/kw     Just wanted it documented that he had been contacted by [facility]

## 2017-10-12 NOTE — Telephone Encounter (Signed)
Pt advised.

## 2017-10-19 ENCOUNTER — Encounter: Payer: Self-pay | Admitting: Family Medicine

## 2017-10-19 DIAGNOSIS — I739 Peripheral vascular disease, unspecified: Secondary | ICD-10-CM | POA: Diagnosis not present

## 2017-10-20 ENCOUNTER — Ambulatory Visit: Payer: BLUE CROSS/BLUE SHIELD | Admitting: Family Medicine

## 2017-11-02 DIAGNOSIS — F3181 Bipolar II disorder: Secondary | ICD-10-CM | POA: Diagnosis not present

## 2017-11-02 DIAGNOSIS — F1020 Alcohol dependence, uncomplicated: Secondary | ICD-10-CM | POA: Diagnosis not present

## 2017-12-15 DIAGNOSIS — I739 Peripheral vascular disease, unspecified: Secondary | ICD-10-CM | POA: Diagnosis not present

## 2017-12-15 DIAGNOSIS — I70213 Atherosclerosis of native arteries of extremities with intermittent claudication, bilateral legs: Secondary | ICD-10-CM | POA: Diagnosis not present

## 2017-12-15 DIAGNOSIS — Z79899 Other long term (current) drug therapy: Secondary | ICD-10-CM | POA: Diagnosis not present

## 2017-12-16 DIAGNOSIS — T1511XA Foreign body in conjunctival sac, right eye, initial encounter: Secondary | ICD-10-CM | POA: Diagnosis not present

## 2017-12-20 DIAGNOSIS — H1011 Acute atopic conjunctivitis, right eye: Secondary | ICD-10-CM | POA: Diagnosis not present

## 2017-12-29 DIAGNOSIS — Z882 Allergy status to sulfonamides status: Secondary | ICD-10-CM | POA: Diagnosis not present

## 2017-12-29 DIAGNOSIS — J449 Chronic obstructive pulmonary disease, unspecified: Secondary | ICD-10-CM | POA: Diagnosis not present

## 2017-12-29 DIAGNOSIS — R51 Headache: Secondary | ICD-10-CM | POA: Diagnosis not present

## 2017-12-29 DIAGNOSIS — M542 Cervicalgia: Secondary | ICD-10-CM | POA: Diagnosis not present

## 2017-12-29 DIAGNOSIS — K219 Gastro-esophageal reflux disease without esophagitis: Secondary | ICD-10-CM | POA: Diagnosis not present

## 2017-12-29 DIAGNOSIS — E785 Hyperlipidemia, unspecified: Secondary | ICD-10-CM | POA: Diagnosis not present

## 2017-12-29 DIAGNOSIS — Z79899 Other long term (current) drug therapy: Secondary | ICD-10-CM | POA: Diagnosis not present

## 2017-12-29 DIAGNOSIS — Z881 Allergy status to other antibiotic agents status: Secondary | ICD-10-CM | POA: Diagnosis not present

## 2017-12-29 DIAGNOSIS — E079 Disorder of thyroid, unspecified: Secondary | ICD-10-CM | POA: Diagnosis not present

## 2017-12-29 DIAGNOSIS — F319 Bipolar disorder, unspecified: Secondary | ICD-10-CM | POA: Diagnosis not present

## 2017-12-29 DIAGNOSIS — H538 Other visual disturbances: Secondary | ICD-10-CM | POA: Diagnosis not present

## 2017-12-29 DIAGNOSIS — F17290 Nicotine dependence, other tobacco product, uncomplicated: Secondary | ICD-10-CM | POA: Diagnosis not present

## 2018-01-06 ENCOUNTER — Encounter: Payer: Self-pay | Admitting: Family Medicine

## 2018-01-06 ENCOUNTER — Ambulatory Visit (INDEPENDENT_AMBULATORY_CARE_PROVIDER_SITE_OTHER): Payer: BLUE CROSS/BLUE SHIELD | Admitting: Family Medicine

## 2018-01-06 VITALS — BP 137/74 | HR 81 | Ht 72.0 in | Wt 195.0 lb

## 2018-01-06 DIAGNOSIS — E039 Hypothyroidism, unspecified: Secondary | ICD-10-CM

## 2018-01-06 DIAGNOSIS — R21 Rash and other nonspecific skin eruption: Secondary | ICD-10-CM

## 2018-01-06 DIAGNOSIS — M549 Dorsalgia, unspecified: Secondary | ICD-10-CM | POA: Diagnosis not present

## 2018-01-06 DIAGNOSIS — M6281 Muscle weakness (generalized): Secondary | ICD-10-CM

## 2018-01-06 DIAGNOSIS — R238 Other skin changes: Secondary | ICD-10-CM

## 2018-01-06 MED ORDER — CLOBETASOL PROPIONATE 0.05 % EX CREA: 1.0000 "application " | TOPICAL_CREAM | Freq: Two times a day (BID) | CUTANEOUS | 0 refills | Status: DC

## 2018-01-06 NOTE — Progress Notes (Signed)
Subjective:    Patient ID: Eddie Hernandez, male    DOB: 1956/03/17, 62 y.o.   MRN: [id number]  HPI  Had surgery 3 weeks ago to have a stent placed in the right and left common femoral in my iliac arteries.  He says the procedure itself went well.  Afterwards he was started on Plavix.  He did well until about 2 weeks after the surgery and then he started to have symptoms.  Last Thursday started having shooting sharp pain in his head, over the left frontal area, in addition to some pain in his left shoulder that was radiating upward so went to ED. he did a CT Angie of the head and neck just to rule out any type of stroke or vascular lesion and everything was negative.  He says since then he still had a little bit of intermittent headache and shoulder pain but says is not nearly as intense as it was that day.  He started also just feeling tired and he noticed a couple of red round lesions on his lower extremities.  Was also experiencing visual changes particularly in his left eye.  They ended up calling the vascular office on Tuesday asking if he could come off the Plavix thinking that some of his symptoms were persistent side effects.  They d/c plavix and put him on Brillinta instead about 3 days ago.  He still has a rash and has a few more lesions but now he is been experiencing pain and burning from his knees down to his feet.  He feels like the neuropathy is almost worse.  He also just feels weak.  He says when he sits down it is difficult to try to stand up most like his muscles are weak and do not want to work.  He is also been having back pain over the kidney area bilaterally.     The rash is not painful or itchy or bothersome.  He says initially they all started as blisters in the center of a red lesion.  He also noted that right after the procedure with having the stents placed he had significant bruising from his hips down bilaterally to the point where everything felt swollen and it was painful to  urinate.  Review of Systems  BP 137/74   Pulse 81   Ht 6' (1.829 m)   Wt 195 lb (88.5 kg)   SpO2 100%   BMI 26.45 kg/m     Allergies  Allergen Reactions  . Brexpiprazole Other (See Comments)    Extreme fatigue, raised BP, SOB  . Sulfonamide Derivatives Rash    REACTION: Rash    Past Medical History:  Diagnosis Date  . Alcohol abuse   . Bipolar 1 disorder ([place])   . Hypothyroidism   . PAD (peripheral artery disease) ([place])   . Peripheral neuropathy    small fiber  . Prostate pain   . Tremor     Past Surgical History:  Procedure Laterality Date  . DEEP BRAIN STIMULATOR PLACEMENT  01-30-08   tremors    Social History   Socioeconomic History  . Marital status: Married    Spouse name: Maudry Mayhew   . Number of children: Not on file  . Years of education: Not on file  . Highest education level: Not on file  Occupational History  . Occupation: works in Scientist, research (medical).      Comment: [name]  Social Needs  . Financial resource strain: Not on file  .  Food insecurity:    Worry: Not on file    Inability: Not on file  . Transportation needs:    Medical: Not on file    Non-medical: Not on file  Tobacco Use  . Smoking status: Former Smoker    Packs/day: 1.00    Years: 45.00    Pack years: 45.00    Types: Cigarettes    Last attempt to quit: 09/08/2017    Years since quitting: 0.3  . Smokeless tobacco: Never Used  . Tobacco comment: Counseled to quit smoking  Substance and Sexual Activity  . Alcohol use: No    Alcohol/week: 0.0 standard drinks    Comment: hx of EtOH abuse  . Drug use: No  . Sexual activity: Not on file  Lifestyle  . Physical activity:    Days per week: Not on file    Minutes per session: Not on file  . Stress: Not on file  Relationships  . Social connections:    Talks on phone: Not on file    Gets together: Not on file    Attends religious service: Not on file    Active member of club or organization: Not on file    Attends meetings of clubs or  organizations: Not on file    Relationship status: Not on file  . Intimate partner violence:    Fear of current or ex partner: Not on file    Emotionally abused: Not on file    Physically abused: Not on file    Forced sexual activity: Not on file  Other Topics Concern  . 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 No active exercise.     Family History  Problem Relation Age of Onset  . Stroke Father   . Alcoholism Father   . Cancer Sister        Lung     Outpatient Encounter Medications as of 01/06/2018  Medication Sig  . ARIPiprazole (ABILIFY) 5 MG tablet TAKE ONE TABLET (5 MG TOTAL) BY MOUTH DAILY  . atorvastatin (LIPITOR) 20 MG tablet Take 1 tablet (20 mg total) by mouth at bedtime.  Marland Kitchen b complex vitamins tablet Take 3 tablets by mouth daily. 2 tablets in the am and 1 tablet in pm  . DULoxetine (CYMBALTA) 60 MG capsule Take by mouth.  . gabapentin (NEURONTIN) 600 MG tablet Take by mouth every morning.   Marland Kitchen GABAPENTIN PO Take 700 mg by mouth 2 (two) times daily. Take 700 mg in the morning, and 700 mg at lunch.  . ibuprofen (ADVIL,MOTRIN) 200 MG tablet Take 800 mg by mouth 2 (two) times daily.  Marland Kitchen levothyroxine (SYNTHROID, LEVOTHROID) 150 MCG tablet Take 1 tablet (150 mcg total) by mouth daily.  Marland Kitchen lisinopril (PRINIVIL,ZESTRIL) 20 MG tablet Take 20 mg by mouth daily.  . mirtazapine (REMERON SOL-TAB) 15 MG disintegrating tablet Take 15 mg by mouth daily.  . Multiple Vitamins-Minerals (MULTIVITAMIN PO) Take 3 capsules by mouth daily. 2 tablets in the morning and 1 in the evening.  Marland Kitchen NUVIGIL 250 MG tablet Take 250 mg by mouth daily.  . protriptyline (VIVACTIL) 10 MG tablet Take 10 mg by mouth 2 (two) times daily.  . ticagrelor (BRILINTA) 90 MG TABS tablet Take 1 tablet by mouth 2 (two) times daily.  . clobetasol cream (TEMOVATE) 6.59 % Apply 1 application topically 2 (two) times daily.  . [DISCONTINUED] albuterol (PROVENTIL HFA;VENTOLIN HFA) 108 (90  Base) MCG/ACT inhaler Inhale  2 puffs into the lungs every 6 (six) hours as needed for wheezing or shortness of breath.  . [DISCONTINUED] lisinopril (PRINIVIL,ZESTRIL) 10 MG tablet TAKE 1 TABLET EVERY DAY   No facility-administered encounter medications on file as of 01/06/2018.          Objective:   Physical Exam  Constitutional: He is oriented to person, place, and time. He appears well-developed and well-nourished.  HENT:  Head: Normocephalic and atraumatic.  Right Ear: External ear normal.  Left Ear: External ear normal.  Nose: Nose normal.  Mouth/Throat: Oropharynx is clear and moist.  TMs and canals are clear.   Eyes: Pupils are equal, round, and reactive to light. Conjunctivae and EOM are normal.  Neck: Neck supple. No thyromegaly present.  Cardiovascular: Normal rate, regular rhythm and normal heart sounds.  Pulmonary/Chest: Effort normal and breath sounds normal.  Musculoskeletal:  Strength is 5 out of 5 in the upper and lower extremities bilaterally with symmetry.  Reflexes 2+ and patellar bilaterally.  Lymphadenopathy:    He has no cervical adenopathy.  Neurological: He is alert and oriented to person, place, and time.  Skin: Skin is warm and dry.  Psychiatric: He has a normal mood and affect. His behavior is normal.            Assessment & Plan:  Rash vesicular--unclear etiology.  It almost just looks like a spongiotic type lesion which can be seen as a possible drug reaction.  It started while he was on the Plavix it may take several weeks to completely go away we will just have to monitor for new lesions while he is on Brilinta.  In the meantime we will treat with topical steroid just to see if it reduces redness and inflammation.  Muscle weakness-again unclear etiology could be a side effect of the medication.  That he is also on a statin though they did not change his dose.  We will check CK and liver enzymes.  If they are elevated then we may need to decrease  her back off on his statin.  So check thyroid level.  Mid back pain-unclear etiology.  Will evaluate for urinary tract infection.  HypoThyroidism-after major surgery thyroid levels can change due to the stress.  This was mostly just a stent procedure but will check TSH to make sure his levels have not shifted

## 2018-01-10 LAB — CBC
HCT: 35.3 % — ABNORMAL LOW (ref 38.5–50.0)
Hemoglobin: 12.2 g/dL — ABNORMAL LOW (ref 13.2–17.1)
MCH: 30.9 pg (ref 27.0–33.0)
MCHC: 34.6 g/dL (ref 32.0–36.0)
MCV: 89.4 fL (ref 80.0–100.0)
MPV: 10.1 fL (ref 7.5–12.5)
PLATELETS: 270 10*3/uL (ref 140–400)
RBC: 3.95 10*6/uL — AB (ref 4.20–5.80)
RDW: 12.1 % (ref 11.0–15.0)
WBC: 4.8 10*3/uL (ref 3.8–10.8)

## 2018-01-10 LAB — COMPLETE METABOLIC PANEL WITH GFR
AG RATIO: 1.9 (calc) (ref 1.0–2.5)
ALT: 29 U/L (ref 9–46)
AST: 22 U/L (ref 10–35)
Albumin: 4.5 g/dL (ref 3.6–5.1)
Alkaline phosphatase (APISO): 86 U/L (ref 40–115)
BILIRUBIN TOTAL: 0.4 mg/dL (ref 0.2–1.2)
BUN: 9 mg/dL (ref 7–25)
CHLORIDE: 104 mmol/L (ref 98–110)
CO2: 28 mmol/L (ref 20–32)
Calcium: 9.8 mg/dL (ref 8.6–10.3)
Creat: 0.9 mg/dL (ref 0.70–1.25)
GFR, EST AFRICAN AMERICAN: 106 mL/min/{1.73_m2} (ref >=60)
GFR, Est Non African American: 92 mL/min/{1.73_m2} (ref >=60)
Globulin: 2.4 g/dL (calc) (ref 1.9–3.7)
Glucose, Bld: 110 mg/dL — ABNORMAL HIGH (ref 65–99)
POTASSIUM: 4.3 mmol/L (ref 3.5–5.3)
Sodium: 137 mmol/L (ref 135–146)
Total Protein: 6.9 g/dL (ref 6.1–8.1)

## 2018-01-10 LAB — NO CULTURE INDICATED

## 2018-01-10 LAB — URINALYSIS W MICROSCOPIC + REFLEX CULTURE
BACTERIA UA: NONE SEEN /HPF
Bilirubin Urine: NEGATIVE
Glucose, UA: NEGATIVE
HGB URINE DIPSTICK: NEGATIVE
HYALINE CAST: NONE SEEN /LPF
KETONES UR: NEGATIVE
LEUKOCYTE ESTERASE: NEGATIVE
Nitrites, Initial: NEGATIVE
Protein, ur: NEGATIVE
RBC / HPF: NONE SEEN /HPF (ref 0–2)
SQUAMOUS EPITHELIAL / LPF: NONE SEEN /HPF (ref <=5)
Specific Gravity, Urine: 1.013 (ref 1.001–1.03)
WBC UA: NONE SEEN /HPF (ref 0–5)
pH: 6.5 (ref 5.0–8.0)

## 2018-01-10 LAB — TSH: TSH: 0.04 m[IU]/L — AB (ref 0.40–4.50)

## 2018-01-10 LAB — T4, FREE: Free T4: 1.3 ng/dL (ref 0.8–1.8)

## 2018-01-10 LAB — C-REACTIVE PROTEIN: CRP: 3 mg/L (ref <8.0)

## 2018-01-10 LAB — CK: Total CK: 88 U/L (ref 44–196)

## 2018-01-10 LAB — FERRITIN: FERRITIN: 95 ng/mL (ref 24–380)

## 2018-01-10 LAB — SEDIMENTATION RATE: SED RATE: 11 mm/h (ref 0–20)

## 2018-01-10 LAB — VITAMIN B12: VITAMIN B 12: 1402 pg/mL — AB (ref 200–1100)

## 2018-01-20 DIAGNOSIS — H4041X1 Glaucoma secondary to eye inflammation, right eye, mild stage: Secondary | ICD-10-CM | POA: Diagnosis not present

## 2018-01-23 DIAGNOSIS — H4321 Crystalline deposits in vitreous body, right eye: Secondary | ICD-10-CM | POA: Diagnosis not present

## 2018-01-23 DIAGNOSIS — H3091 Unspecified chorioretinal inflammation, right eye: Secondary | ICD-10-CM | POA: Diagnosis not present

## 2018-01-23 DIAGNOSIS — H20013 Primary iridocyclitis, bilateral: Secondary | ICD-10-CM | POA: Diagnosis not present

## 2018-01-26 ENCOUNTER — Ambulatory Visit (INDEPENDENT_AMBULATORY_CARE_PROVIDER_SITE_OTHER): Payer: BLUE CROSS/BLUE SHIELD | Admitting: Family Medicine

## 2018-01-26 ENCOUNTER — Encounter: Payer: Self-pay | Admitting: Family Medicine

## 2018-01-26 VITALS — BP 141/66 | HR 89 | Ht 72.0 in | Wt 193.0 lb

## 2018-01-26 DIAGNOSIS — R21 Rash and other nonspecific skin eruption: Secondary | ICD-10-CM | POA: Diagnosis not present

## 2018-01-26 DIAGNOSIS — Z23 Encounter for immunization: Secondary | ICD-10-CM | POA: Diagnosis not present

## 2018-01-26 DIAGNOSIS — B5801 Toxoplasma chorioretinitis: Secondary | ICD-10-CM

## 2018-01-26 DIAGNOSIS — K5900 Constipation, unspecified: Secondary | ICD-10-CM

## 2018-01-26 NOTE — Progress Notes (Signed)
Subjective:    Patient ID: Eddie Hernandez, male    DOB: [birth year], 62 y.o.   MRN: [id number]  HPI 62 year old male comes in today to follow-up on some of his previous symptoms that included a rash, headaches, medication side effect etc.  Please see previous note.  He actually is doing better overall.  He started using the topical steroid cream that we gave him for the rash and says as soon as he started using it it started improving.  Is not had any new lesions on the feet that he still has her in the healing phase.  He was also having some vision issues when I last saw him and I encouraged him to get in with his eye doctor.  He was seen and had a significantly elevated pressure in his right eye.  He was then referred to a specialist promptly and they diagnosed him with toxoplasmosis.  He is now on oral Bactrim for a couple of months as well as 3 different eyedrops 4 times a day.  He still has a lace work over his vision in his right eye.  He works at [employer] so is exposed to animals.  He also wants to discuss constipation.  He has been struggling struggling with some constipation.  He tried MiraLAX for several days but that it just made him gassy.  He has used magnesium citrate and that does seem to help.  He also used some suppositories but that did not seem to be very effective either.   Review of Systems  BP (!) 141/66   Pulse 89   Ht 6' (1.829 m)   Wt 193 lb (87.5 kg)   SpO2 97%   BMI 26.18 kg/m     Allergies  Allergen Reactions  . Brexpiprazole Other (See Comments)    Extreme fatigue, raised BP, SOB  . Sulfa Antibiotics Itching and Rash  . Sulfonamide Derivatives Rash    REACTION: Rash    Past Medical History:  Diagnosis Date  . Alcohol abuse   . Bipolar 1 disorder ([place])   . Hypothyroidism   . PAD (peripheral artery disease) ([place])   . Peripheral neuropathy    small fiber  . Prostate pain   . Tremor     Past Surgical History:  Procedure Laterality Date  . DEEP BRAIN  STIMULATOR PLACEMENT  01-30-08   tremors    Social History   Socioeconomic History  . Marital status: Married    Spouse name: Maudry Mayhew   . Number of children: Not on file  . Years of education: Not on file  . Highest education level: Not on file  Occupational History  . Occupation: works in Scientist, research (medical).      Comment: [name]  Social Needs  . Financial resource strain: Not on file  . Food insecurity:    Worry: Not on file    Inability: Not on file  . Transportation needs:    Medical: Not on file    Non-medical: Not on file  Tobacco Use  . Smoking status: Former Smoker    Packs/day: 1.00    Years: 45.00    Pack years: 45.00    Types: Cigarettes    Last attempt to quit: 09/08/2017    Years since quitting: 0.3  . Smokeless tobacco: Never Used  . Tobacco comment: Counseled to quit smoking  Substance and Sexual Activity  . Alcohol use: No    Alcohol/week: 0.0 standard drinks    Comment: hx of EtOH  abuse  . Drug use: No  . Sexual activity: Not on file  Lifestyle  . Physical activity:    Days per week: Not on file    Minutes per session: Not on file  . Stress: Not on file  Relationships  . Social connections:    Talks on phone: Not on file    Gets together: Not on file    Attends religious service: Not on file    Active member of club or organization: Not on file    Attends meetings of clubs or organizations: Not on file    Relationship status: Not on file  . Intimate partner violence:    Fear of current or ex partner: Not on file    Emotionally abused: Not on file    Physically abused: Not on file    Forced sexual activity: Not on file  Other Topics Concern  . 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 No active exercise.     Family History  Problem Relation Age of Onset  . Stroke Father   . Alcoholism Father   . Cancer Sister        Lung     Outpatient Encounter Medications as of 01/26/2018  Medication Sig  .  ACETAMINOPHEN PO Take 1,500 mg by mouth 2 (two) times daily.  . COMBIGAN 0.2-0.5 % ophthalmic solution Place 1 drop into the right eye 2 (two) times daily.   . dorzolamide (TRUSOPT) 2 % ophthalmic solution Place 1 drop into the right eye 3 (three) times daily.   . Multiple Vitamins-Minerals (MULTIVITAMIN MEN 50+) TABS Take 1 tablet by mouth daily.  Marland Kitchen sulfamethoxazole-trimethoprim (BACTRIM DS,SEPTRA DS) 800-160 MG tablet Take 1 tablet by mouth 2 (two) times daily.  Marland Kitchen tobramycin-dexamethasone (TOBRADEX) ophthalmic ointment Place 1 application into the right eye 4 (four) times daily.  . ARIPiprazole (ABILIFY) 5 MG tablet TAKE ONE TABLET (5 MG TOTAL) BY MOUTH DAILY  . atorvastatin (LIPITOR) 20 MG tablet Take 1 tablet (20 mg total) by mouth at bedtime. (Patient taking differently: Take 20 mg by mouth at bedtime. Pt taking 1/2 tablet)  . DULoxetine (CYMBALTA) 60 MG capsule Take 30 mg by mouth daily.   Marland Kitchen gabapentin (NEURONTIN) 600 MG tablet Take 900 mg by mouth 3 (three) times daily.   Marland Kitchen levothyroxine (SYNTHROID, LEVOTHROID) 150 MCG tablet Take 1 tablet (150 mcg total) by mouth daily.  Marland Kitchen lisinopril (PRINIVIL,ZESTRIL) 20 MG tablet Take 20 mg by mouth daily.  . mirtazapine (REMERON SOL-TAB) 15 MG disintegrating tablet Take 15 mg by mouth daily.  Marland Kitchen NUVIGIL 250 MG tablet Take 250 mg by mouth daily.  . protriptyline (VIVACTIL) 10 MG tablet Take 10 mg by mouth 2 (two) times daily.  . ticagrelor (BRILINTA) 90 MG TABS tablet Take 1 tablet by mouth 2 (two) times daily.  . [DISCONTINUED] b complex vitamins tablet Take 3 tablets by mouth daily. 2 tablets in the am and 1 tablet in pm  . [DISCONTINUED] clobetasol cream (TEMOVATE) 1.91 % Apply 1 application topically 2 (two) times daily.  . [DISCONTINUED] GABAPENTIN PO Take 700 mg by mouth 2 (two) times daily. Take 700 mg in the morning, and 700 mg at lunch.  . [DISCONTINUED] ibuprofen (ADVIL,MOTRIN) 200 MG tablet Take 800 mg by mouth 2 (two) times daily.  .  [DISCONTINUED] Multiple Vitamins-Minerals (MULTIVITAMIN PO) Take 3 capsules by mouth daily. 2 tablets in the morning and 1 in the evening.  No facility-administered encounter medications on file as of 01/26/2018.          Objective:   Physical Exam  Constitutional: He is oriented to person, place, and time. He appears well-developed and well-nourished.  HENT:  Head: Normocephalic and atraumatic.  Cardiovascular: Normal rate, regular rhythm and normal heart sounds.  Pulmonary/Chest: Effort normal and breath sounds normal.  Neurological: He is alert and oriented to person, place, and time.  Skin: Skin is warm and dry.  Psychiatric: He has a normal mood and affect. His behavior is normal.        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  Follow-up with eye doctors tomorrow.  Rash-in retrospect unclear etiology.  Initially we felt like his medication side effect from Plavix but it may actually have been the toxoplasmosis.  That certainly a possibility as he was also having headaches and fatigue and myalgias.  He is now on treatment with Bactrim.  Myalgias/muscle weakness-he is feeling better overall.  Constipation-recommend increasing fiber such as MiraLAX in the diet daily and then okay to rely on a stool laxative every few days if needed.  He will try Metamucil.  Mild anemia-I do want to recheck his hemoglobin in a couple of weeks to make sure that it is improving.  Otherwise we do need to work him up.  Anoscopy be is up-to-date.

## 2018-01-27 DIAGNOSIS — H3091 Unspecified chorioretinal inflammation, right eye: Secondary | ICD-10-CM | POA: Diagnosis not present

## 2018-02-03 DIAGNOSIS — I10 Essential (primary) hypertension: Secondary | ICD-10-CM | POA: Diagnosis not present

## 2018-02-03 DIAGNOSIS — I739 Peripheral vascular disease, unspecified: Secondary | ICD-10-CM | POA: Diagnosis not present

## 2018-02-08 DIAGNOSIS — B5801 Toxoplasma chorioretinitis: Secondary | ICD-10-CM | POA: Diagnosis not present

## 2018-03-01 DIAGNOSIS — B5801 Toxoplasma chorioretinitis: Secondary | ICD-10-CM | POA: Diagnosis not present

## 2018-03-10 ENCOUNTER — Ambulatory Visit: Payer: BLUE CROSS/BLUE SHIELD | Admitting: Family Medicine

## 2018-03-29 DIAGNOSIS — B5801 Toxoplasma chorioretinitis: Secondary | ICD-10-CM | POA: Diagnosis not present

## 2018-03-30 ENCOUNTER — Other Ambulatory Visit: Payer: Self-pay | Admitting: Family Medicine

## 2018-03-30 DIAGNOSIS — E039 Hypothyroidism, unspecified: Secondary | ICD-10-CM

## 2018-04-11 ENCOUNTER — Encounter: Payer: Self-pay | Admitting: Family Medicine

## 2018-04-11 ENCOUNTER — Ambulatory Visit: Payer: BLUE CROSS/BLUE SHIELD | Admitting: Family Medicine

## 2018-04-11 ENCOUNTER — Ambulatory Visit (INDEPENDENT_AMBULATORY_CARE_PROVIDER_SITE_OTHER): Payer: BLUE CROSS/BLUE SHIELD | Admitting: Family Medicine

## 2018-04-11 VITALS — BP 139/79 | HR 106 | Temp 98.7°F | Ht 71.65 in | Wt 189.0 lb

## 2018-04-11 DIAGNOSIS — J069 Acute upper respiratory infection, unspecified: Secondary | ICD-10-CM

## 2018-04-11 DIAGNOSIS — J029 Acute pharyngitis, unspecified: Secondary | ICD-10-CM

## 2018-04-11 LAB — POCT RAPID STREP A (OFFICE): RAPID STREP A SCREEN: NEGATIVE

## 2018-04-11 NOTE — Progress Notes (Signed)
   Subjective:    Patient ID: ADA WOODBURY, male    DOB: [birth year], 62 y.o.   MRN: [id number]  HPI  62 old male comes in today for sinus congestion and sore throat x 1 day.  He is had some green nasal mucus.  He has been using Robitussin CF. Voice is raspy and weak.   No fever, chills or sweats.  No GI symptoms.  He says the congestion has actually been for about 3 days but the sore throat started yesterday.  Review of Systems     Objective:   Physical Exam  Constitutional: He is oriented to person, place, and time. He appears well-developed and well-nourished.  HENT:  Head: Normocephalic and atraumatic.  Right Ear: External ear normal.  Left Ear: External ear normal.  Nose: Nose normal.  Mouth/Throat: Oropharynx is clear and moist.  TMs and canals are clear.   Eyes: Pupils are equal, round, and reactive to light. Conjunctivae and EOM are normal.  Neck: Neck supple. No thyromegaly present.  Cardiovascular: Normal rate and normal heart sounds.  Pulmonary/Chest: Effort normal and breath sounds normal.  Lymphadenopathy:    He has no cervical adenopathy.  Neurological: He is alert and oriented to person, place, and time.  Skin: Skin is warm and dry.  Psychiatric: He has a normal mood and affect.        Assessment & Plan:  URI - likely viral. Recommend symptomatic care.  If not improving by the end of the week then please give [place] call back or if he feels like he is getting worse such as developing a fever increased cough or sputum production and please let us know.

## 2018-04-11 NOTE — Patient Instructions (Signed)
Upper Respiratory Infection, Adult Most upper respiratory infections (URIs) are caused by a virus. A URI affects the nose, throat, and upper air passages. The most common type of URI is often called "the common cold." Follow these instructions at home:  Take medicines only as told by your doctor.  Gargle warm saltwater or take cough drops to comfort your throat as told by your doctor.  Use a warm mist humidifier or inhale steam from a shower to increase air moisture. This may make it easier to breathe.  Drink enough fluid to keep your pee (urine) clear or pale yellow.  Eat soups and other clear broths.  Have a healthy diet.  Rest as needed.  Go back to work when your fever is gone or your doctor says it is okay. ? You may need to stay home longer to avoid giving your URI to others. ? You can also wear a face mask and wash your hands often to prevent spread of the virus.  Use your inhaler more if you have asthma.  Do not use any tobacco products, including cigarettes, chewing tobacco, or electronic cigarettes. If you need help quitting, ask your doctor. Contact a doctor if:  You are getting worse, not better.  Your symptoms are not helped by medicine.  You have chills.  You are getting more short of breath.  You have brown or red mucus.  You have yellow or brown discharge from your nose.  You have pain in your face, especially when you bend forward.  You have a fever.  You have puffy (swollen) neck glands.  You have pain while swallowing.  You have white areas in the back of your throat. Get help right away if:  You have very bad or constant: ? Headache. ? Ear pain. ? Pain in your forehead, behind your eyes, and over your cheekbones (sinus pain). ? Chest pain.  You have long-lasting (chronic) lung disease and any of the following: ? Wheezing. ? Long-lasting cough. ? Coughing up blood. ? A change in your usual mucus.  You have a stiff neck.  You have  changes in your: ? Vision. ? Hearing. ? Thinking. ? Mood. This information is not intended to replace advice given to you by your health care provider. Make sure you discuss any questions you have with your health care provider. Document Released: 11/03/2007 Document Revised: 01/18/2016 Document Reviewed: 08/22/2013 Elsevier Interactive Patient Education  2018 Elsevier Inc.  

## 2018-05-03 DIAGNOSIS — F1020 Alcohol dependence, uncomplicated: Secondary | ICD-10-CM | POA: Diagnosis not present

## 2018-05-03 DIAGNOSIS — F3181 Bipolar II disorder: Secondary | ICD-10-CM | POA: Diagnosis not present

## 2018-06-29 ENCOUNTER — Encounter: Payer: Self-pay | Admitting: Family Medicine

## 2018-06-29 DIAGNOSIS — I739 Peripheral vascular disease, unspecified: Secondary | ICD-10-CM | POA: Diagnosis not present

## 2018-07-03 ENCOUNTER — Telehealth: Payer: Self-pay | Admitting: Family Medicine

## 2018-07-03 NOTE — Telephone Encounter (Signed)
Left VM for Pt to return clinic call regarding results, callback information provided. 

## 2018-07-03 NOTE — Telephone Encounter (Signed)
Pt advised of results, verbalized understanding. He will start taking a whole tab of the Lipitor at night. He does question if this will help with the right leg pain? Report the pain is why he had the stents put in but he still has the pain. Will route.

## 2018-07-03 NOTE — Telephone Encounter (Signed)
Patient: I did receive his lower extremity PVR report.  It does show a mild decrease in the ABI on the right leg showing some decreased blood flow.  Is normal on the left.  Recommendation at this point is to make sure that we are maximizing blood pressure control as well as cholesterol control.  In addition to regular exercise to help improve blood flow in the legs.  I believe he is currently taking a half a tab of his cholesterol pill at bedtime.  Please encourage him to try going back up to a whole if tolerated.

## 2018-07-04 NOTE — Telephone Encounter (Signed)
It is possible that it could help.  There is some actually good studies showing that statins actually can reduce plaque formation that is already there.  It will not make it go completely away but sometimes it can actually decrease the amount of plaque there and certainly prevents further buildup which could continue to worsen his problem.

## 2018-07-04 NOTE — Telephone Encounter (Signed)
Pt advised.

## 2018-08-18 ENCOUNTER — Encounter: Payer: Self-pay | Admitting: *Deleted

## 2018-08-18 ENCOUNTER — Telehealth: Payer: Self-pay | Admitting: Family Medicine

## 2018-08-18 NOTE — Telephone Encounter (Signed)
PT needs a note for work, He is in the high risk group for COVID 19 (COPD,OVER 60). Would like to stay home when this situation is going on.   When completed please fax to his place of work. [phone]

## 2018-08-18 NOTE — Telephone Encounter (Signed)
Letter written and faxed.Eddie Hernandez, Eddie Hernandez, CMA

## 2018-08-29 ENCOUNTER — Other Ambulatory Visit: Payer: Self-pay | Admitting: Family Medicine

## 2018-09-20 ENCOUNTER — Other Ambulatory Visit: Payer: Self-pay | Admitting: Family Medicine

## 2018-09-20 DIAGNOSIS — E039 Hypothyroidism, unspecified: Secondary | ICD-10-CM

## 2018-09-20 NOTE — Telephone Encounter (Signed)
Please schedule for virtual visit for follow-up.  His thyroid was off and we checked it last fall.  So he is also due for repeat thyroid labs.  If he is going to run out completely then we can go ahead and send over a refill for 90 days but we definitely need to go ahead and do a virtual visit and get some up-to-date blood work

## 2018-09-21 ENCOUNTER — Encounter: Payer: Self-pay | Admitting: Family Medicine

## 2018-09-21 ENCOUNTER — Ambulatory Visit (INDEPENDENT_AMBULATORY_CARE_PROVIDER_SITE_OTHER): Payer: BLUE CROSS/BLUE SHIELD | Admitting: Family Medicine

## 2018-09-21 VITALS — Ht 71.65 in

## 2018-09-21 DIAGNOSIS — I2510 Atherosclerotic heart disease of native coronary artery without angina pectoris: Secondary | ICD-10-CM

## 2018-09-21 DIAGNOSIS — F3132 Bipolar disorder, current episode depressed, moderate: Secondary | ICD-10-CM

## 2018-09-21 DIAGNOSIS — I10 Essential (primary) hypertension: Secondary | ICD-10-CM | POA: Diagnosis not present

## 2018-09-21 DIAGNOSIS — I739 Peripheral vascular disease, unspecified: Secondary | ICD-10-CM

## 2018-09-21 DIAGNOSIS — E039 Hypothyroidism, unspecified: Secondary | ICD-10-CM

## 2018-09-21 DIAGNOSIS — D649 Anemia, unspecified: Secondary | ICD-10-CM

## 2018-09-21 MED ORDER — LEVOTHYROXINE SODIUM 150 MCG PO TABS: 150.0000 ug | ORAL_TABLET | Freq: Every day | ORAL | 0 refills | Status: DC

## 2018-09-21 MED ORDER — ATORVASTATIN CALCIUM 40 MG PO TABS: 40.0000 mg | ORAL_TABLET | Freq: Every day | ORAL | 3 refills | Status: DC

## 2018-09-21 NOTE — Progress Notes (Signed)
Virtual Visit via Video Note  I connected with Eddie Hernandez on 09/21/18 at  2:20 PM EDT by a video enabled telemedicine application and verified that I am speaking with the correct person using two identifiers.   I discussed the limitations of evaluation and management by telemedicine and the availability of in person appointments. The patient expressed understanding and agreed to proceed.  Pt at work and I was in my home office or encouter.  Subjective:    CC: Bp and thyroid  HPI:  Hypertension- Pt denies chest pain, SOB, dizziness, or heart palpitations.  Taking meds as directed w/o problems.  Denies medication side effects.    F/U CAD and PVD - No recent CP or SOB. Now on trental and that has helped leg pain. He had to have his Stents "redone" bc he was still having a lot of pain in his legs.  He is only taking 20mg  of lipitor. He finaly went to a hole tab and has done well on that.    Hypothyroidism - Taking medication regularly in the AM away from food and vitamins, etc. No recent change to skin, hair, or energy levels. Lab Results  Component Value Date   TSH 0.04 (L) 01/06/2018      Past medical history, Surgical history, Family history not pertinant except as noted below, Social history, Allergies, and medications have been entered into the medical record, reviewed, and corrections made.   Review of Systems: No fevers, chills, night sweats, weight loss, chest pain, or shortness of breath.   Objective:    General: Speaking clearly in complete sentences without any shortness of breath.  Alert and oriented x3.  Normal judgment. No apparent acute distress. Well groomed.    Impression and Recommendations:    HTN - will check at home and let me know how it is doing.  F/U in 6 months.   CAD - will increase his lipitor to 40mg  new rx sent.   PVD - doing well on Plental. Will increase lipitor to 40mg .    Hypothyroidism - plan to recheck levels.  Will sen refill today. He  will go labs.           I discussed the assessment and treatment plan with the patient. The patient was provided an opportunity to ask questions and all were answered. The patient agreed with the plan and demonstrated an understanding of the instructions.   The patient was advised to call back or seek an in-person evaluation if the symptoms worsen or if the condition fails to improve as anticipated.   Beatrice Lecher, MD

## 2018-09-21 NOTE — Telephone Encounter (Signed)
Patient scheduled for today

## 2018-09-23 ENCOUNTER — Other Ambulatory Visit: Payer: Self-pay | Admitting: Family Medicine

## 2018-09-30 LAB — CBC
HCT: 39.3 % (ref 38.5–50.0)
Hemoglobin: 13.3 g/dL (ref 13.2–17.1)
MCH: 30.9 pg (ref 27.0–33.0)
MCHC: 33.8 g/dL (ref 32.0–36.0)
MCV: 91.4 fL (ref 80.0–100.0)
MPV: 10.4 fL (ref 7.5–12.5)
Platelets: 259 10*3/uL (ref 140–400)
RBC: 4.3 10*6/uL (ref 4.20–5.80)
RDW: 12.5 % (ref 11.0–15.0)
WBC: 6 10*3/uL (ref 3.8–10.8)

## 2018-09-30 LAB — COMPLETE METABOLIC PANEL WITH GFR
AG Ratio: 1.8 (calc) (ref 1.0–2.5)
ALT: 35 U/L (ref 9–46)
AST: 28 U/L (ref 10–35)
Albumin: 4.1 g/dL (ref 3.6–5.1)
Alkaline phosphatase (APISO): 105 U/L (ref 35–144)
BUN: 14 mg/dL (ref 7–25)
CO2: 29 mmol/L (ref 20–32)
Calcium: 9.3 mg/dL (ref 8.6–10.3)
Chloride: 105 mmol/L (ref 98–110)
Creat: 0.93 mg/dL (ref 0.70–1.25)
GFR, Est African American: 102 mL/min/{1.73_m2} (ref >=60)
GFR, Est Non African American: 88 mL/min/{1.73_m2} (ref >=60)
Globulin: 2.3 g/dL (calc) (ref 1.9–3.7)
Glucose, Bld: 129 mg/dL — ABNORMAL HIGH (ref 65–99)
Potassium: 5.3 mmol/L (ref 3.5–5.3)
Sodium: 138 mmol/L (ref 135–146)
Total Bilirubin: 0.3 mg/dL (ref 0.2–1.2)
Total Protein: 6.4 g/dL (ref 6.1–8.1)

## 2018-09-30 LAB — LIPID PANEL
Cholesterol: 139 mg/dL (ref <200)
HDL: 44 mg/dL (ref >=40)
LDL Cholesterol (Calc): 65 mg/dL (calc)
Non-HDL Cholesterol (Calc): 95 mg/dL (calc) (ref <130)
Total CHOL/HDL Ratio: 3.2 (calc) (ref <5.0)
Triglycerides: 239 mg/dL — ABNORMAL HIGH (ref <150)

## 2018-09-30 LAB — TSH: TSH: 0.03 mIU/L — ABNORMAL LOW (ref 0.40–4.50)

## 2018-10-03 ENCOUNTER — Other Ambulatory Visit: Payer: Self-pay | Admitting: *Deleted

## 2018-10-03 DIAGNOSIS — R7301 Impaired fasting glucose: Secondary | ICD-10-CM

## 2018-10-03 DIAGNOSIS — E039 Hypothyroidism, unspecified: Secondary | ICD-10-CM

## 2018-10-26 ENCOUNTER — Other Ambulatory Visit: Payer: Self-pay

## 2018-10-26 ENCOUNTER — Ambulatory Visit: Payer: BLUE CROSS/BLUE SHIELD

## 2018-10-26 DIAGNOSIS — F17210 Nicotine dependence, cigarettes, uncomplicated: Secondary | ICD-10-CM

## 2018-10-26 DIAGNOSIS — Z122 Encounter for screening for malignant neoplasm of respiratory organs: Secondary | ICD-10-CM

## 2018-11-03 ENCOUNTER — Telehealth: Payer: Self-pay

## 2018-11-03 DIAGNOSIS — Z122 Encounter for screening for malignant neoplasm of respiratory organs: Secondary | ICD-10-CM

## 2018-11-03 DIAGNOSIS — F17210 Nicotine dependence, cigarettes, uncomplicated: Secondary | ICD-10-CM

## 2018-11-06 NOTE — Telephone Encounter (Signed)
Pt informed of CT results per Sarah Groce, NP.  PT verbalized understanding.  Copy sent to PCP.  Order placed for 1 yr f/u CT.  

## 2018-12-14 ENCOUNTER — Other Ambulatory Visit: Payer: Self-pay | Admitting: Family Medicine

## 2018-12-14 DIAGNOSIS — E039 Hypothyroidism, unspecified: Secondary | ICD-10-CM

## 2019-01-08 ENCOUNTER — Telehealth: Payer: Self-pay

## 2019-01-08 NOTE — Telephone Encounter (Signed)
Eddie Hernandez called to see if the form was ready for pick up. He wanted the form to be filled out stating he can't wear a mask but could wear a shield. Please advise.

## 2019-01-08 NOTE — Telephone Encounter (Signed)
Form completed to not wear mask but ok to wear shield.  He can pick up at his convenience.

## 2019-01-09 NOTE — Telephone Encounter (Signed)
Pt advised.

## 2019-01-29 ENCOUNTER — Other Ambulatory Visit: Payer: Self-pay

## 2019-01-29 DIAGNOSIS — R7301 Impaired fasting glucose: Secondary | ICD-10-CM

## 2019-01-29 DIAGNOSIS — E039 Hypothyroidism, unspecified: Secondary | ICD-10-CM

## 2019-01-31 ENCOUNTER — Other Ambulatory Visit: Payer: Self-pay | Admitting: Family Medicine

## 2019-01-31 DIAGNOSIS — E039 Hypothyroidism, unspecified: Secondary | ICD-10-CM

## 2019-01-31 LAB — HEMOGLOBIN A1C
Hgb A1c MFr Bld: 6 %{Hb} — ABNORMAL HIGH
Mean Plasma Glucose: 126 (calc)
eAG (mmol/L): 7 (calc)

## 2019-01-31 LAB — TSH: TSH: 0.05 mIU/L — ABNORMAL LOW (ref 0.40–4.50)

## 2019-01-31 MED ORDER — LEVOTHYROXINE SODIUM 137 MCG PO TABS: 137.0000 ug | ORAL_TABLET | Freq: Every day | ORAL | 0 refills | Status: DC

## 2019-02-23 ENCOUNTER — Other Ambulatory Visit: Payer: Self-pay

## 2019-02-23 ENCOUNTER — Ambulatory Visit (INDEPENDENT_AMBULATORY_CARE_PROVIDER_SITE_OTHER): Payer: BLUE CROSS/BLUE SHIELD | Admitting: Family Medicine

## 2019-02-23 VITALS — BP 150/84 | HR 91 | Temp 97.5°F | Wt 226.0 lb

## 2019-02-23 DIAGNOSIS — R222 Localized swelling, mass and lump, trunk: Secondary | ICD-10-CM | POA: Diagnosis not present

## 2019-02-23 DIAGNOSIS — Z23 Encounter for immunization: Secondary | ICD-10-CM | POA: Diagnosis not present

## 2019-02-23 MED ORDER — TRIAMCINOLONE ACETONIDE 0.5 % EX CREA: 1.0000 "application " | TOPICAL_CREAM | Freq: Two times a day (BID) | CUTANEOUS | 3 refills | Status: DC

## 2019-02-23 NOTE — Patient Instructions (Addendum)
Thank you for coming in today. I am not sure what this is.  To work it up further will need MRI with and without contrast.  However ok for watchful waiting.  If worsen we can do the MRI.  Treat itching with topical triamcolone cream Also ok to use over the counter gold bond itch for temporary control of itching.  Ok to take pictures of it if it looks worse.  You can attach pictures to mychart.

## 2019-02-23 NOTE — Progress Notes (Signed)
Eddie Hernandez is a 63 y.o. male who presents to [name] today for itchy bump on back.  Itchy painful bump on back 6-8 months. Initially was painful and itchy then improved.  It worsened again about a week ago.  His wife who is a massage therapist has rubbed it a bit.  She is not sure what it is.  He denies any expressible fluid or redness or pain currently.  No fevers or chills.  He does not think it is growing.  ROS as above:  Exam:  BP (!) 150/84   Pulse 91   Temp (!) 97.5 F (36.4 C) (Oral)   Wt 226 lb (102.5 kg)   BMI 30.95 kg/m  Wt Readings from Last 5 Encounters:  02/23/19 226 lb (102.5 kg)  04/11/18 189 lb (85.7 kg)  01/26/18 193 lb (87.5 kg)  01/06/18 195 lb (88.5 kg)  09/22/17 190 lb (86.2 kg)    Gen: Well NAD Left thoracic back.  Skin with no visible rashes or visible raised masses. Skin overlying left thoracic paraspinal musculature with small palpable subcutaneous mass.  Not particularly tender with no fluctuance.  Mass is somewhat mobile.  Measures about 1 cm in diameter.    Lab and Radiology Results Limited musculoskeletal ultrasound left subcutaneous mass left back. No cystic lesions seen.  No obvious mass visible.  Possible increased thickness of musculature in this area however it measures larger than the palpable mass.  No increased blood flow.  No calcifications.  Relatively normal appearance musculature.   Assessment and Plan: 63 y.o. male with mass left back.  Unclear etiology very possible lipoma however it is itchy which does not make sense for lipoma.  We discussed options.  Ideally the best way to evaluate this further would be MRI with and without contrast.  He would like to avoid doing this and would like to proceed with some watchful waiting.  I think that is pretty reasonable as long as is not worsening in size.  Reasonable to  use triamcinolone cream as needed for itching along with Goldbond itch for temporary control of itching.  Recheck as needed.  PDMP not reviewed this encounter. No orders of the defined types were placed in this encounter.  Meds ordered this encounter  Medications  . triamcinolone cream (KENALOG) 0.5 %    Sig: Apply 1 application topically 2 (two) times daily. To affected areas.    Dispense:  30 g    Refill:  3     Historical information moved to improve visibility of documentation.  Past Medical History:  Diagnosis Date  . Alcohol abuse   . Bipolar 1 disorder ([place])   . Hypothyroidism   . PAD (peripheral artery disease) ([place])   . Peripheral neuropathy    small fiber  . Prostate pain   . Tremor    Past Surgical History:  Procedure Laterality Date  . DEEP BRAIN STIMULATOR PLACEMENT  01-30-08   tremors   Social History   Tobacco Use  . Smoking status: Former Smoker    Packs/day: 1.00    Years: 45.00    Pack years: 45.00    Types: Cigarettes    Quit date: 09/08/2017    Years since quitting: 1.4  . Smokeless tobacco: Never Used  . Tobacco comment: Counseled to quit smoking  Substance Use Topics  . Alcohol use: No    Alcohol/week: 0.0 standard drinks    Comment:  hx of EtOH abuse   family history includes Alcoholism in his father; Cancer in his sister; Stroke in his father.  Medications: Current Outpatient Medications  Medication Sig Dispense Refill  . ARIPiprazole (ABILIFY) 5 MG tablet TAKE ONE TABLET (5 MG TOTAL) BY MOUTH DAILY 30 tablet 3  . atorvastatin (LIPITOR) 20 MG tablet Take 20 mg by mouth daily.    . DULoxetine (CYMBALTA) 60 MG capsule Take 30 mg by mouth daily.     Marland Kitchen levothyroxine (SYNTHROID) 137 MCG tablet Take 1 tablet (137 mcg total) by mouth daily. 90 tablet 0  . lisinopril (PRINIVIL,ZESTRIL) 20 MG tablet Take 20 mg by mouth daily.    . mirtazapine (REMERON SOL-TAB) 15 MG disintegrating tablet Take 15 mg by mouth daily.    . Multiple Vitamins-Minerals  (MULTIVITAMIN MEN 50+) TABS Take 1 tablet by mouth daily.    Marland Kitchen NUVIGIL 250 MG tablet Take 250 mg by mouth daily.    . pentoxifylline (TRENTAL) 400 MG CR tablet Take 1 tablet by mouth 3 (three) times daily with meals.    . pregabalin (LYRICA) 150 MG capsule Take 1 capsule by mouth 3 (three) times daily.    . protriptyline (VIVACTIL) 10 MG tablet Take 10 mg by mouth 2 (two) times daily.    . ticagrelor (BRILINTA) 90 MG TABS tablet Take 1 tablet by mouth 2 (two) times daily.    Marland Kitchen triamcinolone cream (KENALOG) 0.5 % Apply 1 application topically 2 (two) times daily. To affected areas. 30 g 3   No current facility-administered medications for this visit.    Allergies  Allergen Reactions  . Brexpiprazole Other (See Comments)    Extreme fatigue, raised BP, SOB  . Sulfa Antibiotics Itching and Rash  . Sulfonamide Derivatives Rash    REACTION: Rash     Discussed warning signs or symptoms. Please see discharge instructions. Patient expresses understanding.

## 2019-04-25 ENCOUNTER — Other Ambulatory Visit: Payer: Self-pay | Admitting: Family Medicine

## 2019-04-25 DIAGNOSIS — E039 Hypothyroidism, unspecified: Secondary | ICD-10-CM

## 2019-07-17 ENCOUNTER — Telehealth: Payer: Self-pay | Admitting: Family Medicine

## 2019-07-17 ENCOUNTER — Other Ambulatory Visit: Payer: Self-pay | Admitting: Family Medicine

## 2019-07-17 ENCOUNTER — Other Ambulatory Visit: Payer: Self-pay | Admitting: *Deleted

## 2019-07-17 DIAGNOSIS — E039 Hypothyroidism, unspecified: Secondary | ICD-10-CM

## 2019-07-17 NOTE — Telephone Encounter (Signed)
Eddie Hernandez called to schedule his yearly. He stated that he is in need of a refill for levothyroxine (131mcg tab.) He said he has aprox 5 left.  His appt is scheduled on 07/26/2019.

## 2019-07-17 NOTE — Telephone Encounter (Signed)
levothyroxine (SYNTHROID) 137 MCG tablet 90 tablet 0 07/17/2019    Sig: TAKE 1 TABLET BY MOUTH EVERY DAY   Sent to pharmacy as: levothyroxine (SYNTHROID) 137 MCG tablet   E-Prescribing Status: Receipt confirmed by pharmacy (07/17/2019 8:09 AM EST)    Pt advised.Elouise Munroe, [place]

## 2019-07-23 ENCOUNTER — Other Ambulatory Visit: Payer: Self-pay

## 2019-07-23 DIAGNOSIS — E039 Hypothyroidism, unspecified: Secondary | ICD-10-CM

## 2019-07-23 DIAGNOSIS — R7301 Impaired fasting glucose: Secondary | ICD-10-CM

## 2019-07-23 DIAGNOSIS — I10 Essential (primary) hypertension: Secondary | ICD-10-CM

## 2019-07-23 NOTE — Progress Notes (Signed)
Ordered labs. I didn't order a lipid because he has [employer]. His last check was in May last year.

## 2019-07-24 ENCOUNTER — Encounter: Payer: Self-pay | Admitting: Family Medicine

## 2019-07-24 ENCOUNTER — Other Ambulatory Visit: Payer: Self-pay | Admitting: Family Medicine

## 2019-07-24 LAB — COMPLETE METABOLIC PANEL WITH GFR
AG Ratio: 1.9 (calc) (ref 1.0–2.5)
ALT: 31 U/L (ref 9–46)
AST: 24 U/L (ref 10–35)
Albumin: 4 g/dL (ref 3.6–5.1)
Alkaline phosphatase (APISO): 101 U/L (ref 35–144)
BUN: 14 mg/dL (ref 7–25)
CO2: 27 mmol/L (ref 20–32)
Calcium: 9 mg/dL (ref 8.6–10.3)
Chloride: 106 mmol/L (ref 98–110)
Creat: 0.84 mg/dL (ref 0.70–1.25)
GFR, Est African American: 108 mL/min/{1.73_m2} (ref >=60)
GFR, Est Non African American: 93 mL/min/{1.73_m2} (ref >=60)
Globulin: 2.1 g/dL (calc) (ref 1.9–3.7)
Glucose, Bld: 113 mg/dL (ref 65–139)
Potassium: 5.2 mmol/L (ref 3.5–5.3)
Sodium: 138 mmol/L (ref 135–146)
Total Bilirubin: 0.3 mg/dL (ref 0.2–1.2)
Total Protein: 6.1 g/dL (ref 6.1–8.1)

## 2019-07-24 LAB — HEMOGLOBIN A1C
Hgb A1c MFr Bld: 6.1 % of total Hgb — ABNORMAL HIGH (ref <5.7)
Mean Plasma Glucose: 128 (calc)
eAG (mmol/L): 7.1 (calc)

## 2019-07-24 LAB — TSH: TSH: 0.22 mIU/L — ABNORMAL LOW (ref 0.40–4.50)

## 2019-07-25 ENCOUNTER — Other Ambulatory Visit: Payer: Self-pay | Admitting: Family Medicine

## 2019-07-25 DIAGNOSIS — E039 Hypothyroidism, unspecified: Secondary | ICD-10-CM

## 2019-07-25 MED ORDER — LEVOTHYROXINE SODIUM 125 MCG PO TABS: 125.0000 ug | ORAL_TABLET | Freq: Every day | ORAL | 0 refills | Status: DC

## 2019-07-26 ENCOUNTER — Other Ambulatory Visit: Payer: Self-pay

## 2019-07-26 ENCOUNTER — Encounter: Payer: Self-pay | Admitting: Family Medicine

## 2019-07-26 ENCOUNTER — Ambulatory Visit (INDEPENDENT_AMBULATORY_CARE_PROVIDER_SITE_OTHER): Payer: BC Managed Care – PPO | Admitting: Family Medicine

## 2019-07-26 VITALS — BP 124/59 | HR 89 | Ht 72.0 in | Wt 208.0 lb

## 2019-07-26 DIAGNOSIS — Z23 Encounter for immunization: Secondary | ICD-10-CM

## 2019-07-26 DIAGNOSIS — Z0000 Encounter for general adult medical examination without abnormal findings: Secondary | ICD-10-CM

## 2019-07-26 DIAGNOSIS — N5089 Other specified disorders of the male genital organs: Secondary | ICD-10-CM | POA: Insufficient documentation

## 2019-07-26 DIAGNOSIS — M79645 Pain in left finger(s): Secondary | ICD-10-CM | POA: Diagnosis not present

## 2019-07-26 DIAGNOSIS — G8929 Other chronic pain: Secondary | ICD-10-CM

## 2019-07-26 DIAGNOSIS — K5900 Constipation, unspecified: Secondary | ICD-10-CM

## 2019-07-26 NOTE — Assessment & Plan Note (Addendum)
Will refer to Urology.  May also need endocrine consult. Discussed possibly testing hormones. Consider pituitary cause.

## 2019-07-26 NOTE — Patient Instructions (Signed)

## 2019-07-26 NOTE — Assessment & Plan Note (Signed)
Discussed OA vs tendonitis. He is left handed.  Will try thumb spica for 3 weeks and if not improving then consdier injection for OA with Dr. Darene Lamer, sports med

## 2019-07-26 NOTE — Progress Notes (Addendum)
CPE  Established Patient Office Visit  Subjective:  Patient ID: Eddie Hernandez, male    DOB: 05-31-1956  Age: 64 y.o. MRN: [id number]  CC:  Chief Complaint  Patient presents with  . Annual Exam    HPI [name] presents for CPE.  He is doing well overall.  Still working full-time and walks several miles a day at work even though he does not have a separate exercise routine.  Couple of issues that are going on.  Constipation-has been struggling with constipation for quite some time he feels like it is chronic at this point.  He has tried Senokot, Dulcolax, and even taking MiraLAX regularly.  He says he feels like they work for a while and they just seem to quit working and that he Has to switch of his regimen.  He is also tried increasing water, vegetable intake as well as fiber.  It just seems to be fairly resistant.  He stated go days without a bowel movement and then when he finally does have multiple bowel movements in a day and says it then takes a couple days for him to feel normal again.  He is also having pain at the base of his left thumb.  He is left-handed and does a lot of picking up and gripping at work.  He does a lot of stocking of shelves etc.  He does a lot of handy work at home.  He also reports that he has noticed that his genitals have been getting smaller over the last year in particular the testes and the penis.  He is having difficulty even going to the bathroom now.  He did have low testosterone levels a couple of years ago..    Past Medical History:  Diagnosis Date  . Alcohol abuse   . Bipolar 1 disorder ([place])   . Hypothyroidism   . PAD (peripheral artery disease) ([place])   . Peripheral neuropathy    small fiber  . Prostate pain   . Tremor     Past Surgical History:  Procedure Laterality Date  . DEEP BRAIN STIMULATOR PLACEMENT  01-30-08   tremors    Family History  Problem Relation Age of Onset  . Stroke Father   . Alcoholism Father   . Cancer  Sister        Lung     Social History   Socioeconomic History  . Marital status: Married    Spouse name: Maudry Mayhew   . Number of children: Not on file  . Years of education: Not on file  . Highest education level: Not on file  Occupational History  . Occupation: works in Scientist, research (medical).      Comment: [name]  Tobacco Use  . Smoking status: Former Smoker    Packs/day: 1.00    Years: 45.00    Pack years: 45.00    Types: Cigarettes    Quit date: 09/08/2017    Years since quitting: 1.8  . Smokeless tobacco: Never Used  . Tobacco comment: Counseled to quit smoking  Substance and Sexual Activity  . Alcohol use: No    Alcohol/week: 0.0 standard drinks    Comment: hx of EtOH abuse  . Drug use: No  . Sexual activity: Not on file  Other Topics Concern  . 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 No active exercise.    Social Determinants of Health   Financial Resource Strain:   .  Difficulty of Paying Living Expenses: Not on file  Food Insecurity:   . Worried About Charity fundraiser in the Last Year: Not on file  . Ran Out of Food in the Last Year: Not on file  Transportation Needs:   . Lack of Transportation (Medical): Not on file  . Lack of Transportation (Non-Medical): Not on file  Physical Activity:   . Days of Exercise per Week: Not on file  . Minutes of Exercise per Session: Not on file  Stress:   . Feeling of Stress : Not on file  Social Connections:   . Frequency of Communication with Friends and Family: Not on file  . Frequency of Social Gatherings with Friends and Family: Not on file  . Attends Religious Services: Not on file  . Active Member of Clubs or Organizations: Not on file  . Attends Archivist Meetings: Not on file  . Marital Status: Not on file  Intimate Partner Violence:   . Fear of Current or Ex-Partner: Not on file  . Emotionally Abused: Not on file  . Physically Abused: Not on file  . Sexually Abused:  Not on file    Outpatient Medications Prior to Visit  Medication Sig Dispense Refill  . ARIPiprazole (ABILIFY) 5 MG tablet TAKE ONE TABLET (5 MG TOTAL) BY MOUTH DAILY 30 tablet 3  . atorvastatin (LIPITOR) 20 MG tablet Take 20 mg by mouth at bedtime.    . DULoxetine (CYMBALTA) 60 MG capsule Take 60 mg by mouth daily.    Marland Kitchen levothyroxine (SYNTHROID) 125 MCG tablet Take 1 tablet (125 mcg total) by mouth daily. 90 tablet 0  . lisinopril (ZESTRIL) 20 MG tablet Take 20 mg by mouth daily.    . mirtazapine (REMERON SOL-TAB) 15 MG disintegrating tablet Take 15 mg by mouth daily.    . Multiple Vitamins-Minerals (MULTIVITAMIN MEN 50+) TABS Take 1 tablet by mouth daily.    Marland Kitchen NUVIGIL 250 MG tablet Take 250 mg by mouth daily.    . pentoxifylline (TRENTAL) 400 MG CR tablet Take 1 tablet by mouth 3 (three) times daily with meals.    . pregabalin (LYRICA) 200 MG capsule Take 1 capsule by mouth 3 (three) times daily.    . protriptyline (VIVACTIL) 10 MG tablet Take 10 mg by mouth 2 (two) times daily.    . ticagrelor (BRILINTA) 90 MG TABS tablet Take 1 tablet by mouth 2 (two) times daily.    Marland Kitchen atorvastatin (LIPITOR) 40 MG tablet Take 40 mg by mouth at bedtime.    . DULoxetine (CYMBALTA) 30 MG capsule Take 30 mg by mouth daily.    . DULoxetine (CYMBALTA) 60 MG capsule Take 30 mg by mouth daily.     Marland Kitchen lisinopril (ZESTRIL) 40 MG tablet Take 20 mg by mouth daily.    . pregabalin (LYRICA) 150 MG capsule Take 1 capsule by mouth 3 (three) times daily.    Marland Kitchen triamcinolone cream (KENALOG) 0.5 % Apply 1 application topically 2 (two) times daily. To affected areas. 30 g 3   No facility-administered medications prior to visit.    Allergies  Allergen Reactions  . Brexpiprazole Other (See Comments)    Extreme fatigue, raised BP, SOB  . Sulfa Antibiotics Itching and Rash  . Sulfonamide Derivatives Rash    REACTION: Rash    ROS Review of Systems    Objective:    Physical Exam  Constitutional: He is oriented to  person, place, and time. He appears well-developed and well-nourished.  HENT:  Head: Normocephalic and atraumatic.  Right Ear: External ear normal.  Left Ear: External ear normal.  Nose: Nose normal.  Mouth/Throat: Oropharynx is clear and moist.  Eyes: Pupils are equal, round, and reactive to light. Conjunctivae and EOM are normal.  Neck: No thyromegaly present.  Cardiovascular: Normal rate, regular rhythm, normal heart sounds and intact distal pulses.  Pulmonary/Chest: Effort normal and breath sounds normal.  Abdominal: Soft. Bowel sounds are normal. He exhibits no distension and no mass. There is no abdominal tenderness. There is no rebound and no guarding.  Musculoskeletal:        General: Normal range of motion.     Cervical back: Normal range of motion and neck supple.  Lymphadenopathy:    He has no cervical adenopathy.  Neurological: He is alert and oriented to person, place, and time. He has normal reflexes.  Skin: Skin is warm and dry.  Psychiatric: He has a normal mood and affect. His behavior is normal. Judgment and thought content normal.    BP (!) 124/59   Pulse 89   Ht 6' (1.829 m)   Wt 208 lb (94.3 kg)   SpO2 100%   BMI 28.21 kg/m  Wt Readings from Last 3 Encounters:  07/26/19 208 lb (94.3 kg)  02/23/19 226 lb (102.5 kg)  04/11/18 189 lb (85.7 kg)     There are no preventive care reminders to display for this patient.  There are no preventive care reminders to display for this patient.  Lab Results  Component Value Date   TSH 0.22 (L) 07/23/2019   Lab Results  Component Value Date   WBC 6.0 09/29/2018   HGB 13.3 09/29/2018   HCT 39.3 09/29/2018   MCV 91.4 09/29/2018   PLT 259 09/29/2018   Lab Results  Component Value Date   NA 138 07/23/2019   K 5.2 07/23/2019   CO2 27 07/23/2019   GLUCOSE 113 07/23/2019   BUN 14 07/23/2019   CREATININE 0.84 07/23/2019   BILITOT 0.3 07/23/2019   ALKPHOS 93 04/20/2016   AST 24 07/23/2019   ALT 31 07/23/2019    PROT 6.1 07/23/2019   ALBUMIN 4.2 04/20/2016   CALCIUM 9.0 07/23/2019   Lab Results  Component Value Date   CHOL 139 09/29/2018   Lab Results  Component Value Date   HDL 44 09/29/2018   Lab Results  Component Value Date   LDLCALC 65 09/29/2018   Lab Results  Component Value Date   TRIG 239 (H) 09/29/2018   Lab Results  Component Value Date   CHOLHDL 3.2 09/29/2018   Lab Results  Component Value Date   HGBA1C 6.1 (H) 07/23/2019      Assessment & Plan:   Problem List Items Addressed This Visit      Genitourinary   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  May also need endocrine consult. Discussed possibly testing hormones. Consider pituitary cause.       Relevant Orders   Ambulatory referral to Urology     Other   Chronic pain of left thumb    Discussed OA vs tendonitis. He is left handed.  Will try thumb spica for 3 weeks and if not improving then consdier injection for OA with Dr. Darene Lamer, sports med      Relevant Medications   pregabalin (LYRICA) 200 MG capsule   DULoxetine (CYMBALTA) 60 MG capsule    Other Visit Diagnoses    Wellness examination    -  Primary   Relevant Orders   Pneumococcal polysaccharide vaccine 23-valent greater than or equal to 2yo subcutaneous/IM (Completed)   Need for tetanus, diphtheria, and acellular pertussis (Tdap) vaccine in patient of adolescent age or older       Relevant Orders   Tdap vaccine greater than or equal to 7yo IM (Completed)   Need for prophylactic vaccination against Streptococcus pneumoniae (pneumococcus)       Relevant Orders   Pneumococcal polysaccharide vaccine 23-valent greater than or equal to 2yo subcutaneous/IM (Completed)   Constipation, unspecified constipation type       Relevant Medications   lubiprostone (AMITIZA) 24 MCG capsule     Keep up a regular exercise program and make sure you are eating a healthy diet Try to eat 4 servings of dairy a day, or if you are lactose intolerant  take a calcium with vitamin D daily.  Your vaccines are up to date. Tdap and Pneumovax 23 given today.     Meds ordered this encounter  Medications  . lubiprostone (AMITIZA) 24 MCG capsule    Sig: Take 1 capsule (24 mcg total) by mouth 2 (two) times daily with a meal.    Dispense:  60 capsule    Refill:  5    Follow-up: Return in about 6 months (around 01/23/2020) for Hypertension.    Beatrice Lecher, MD

## 2019-07-27 ENCOUNTER — Encounter: Payer: Self-pay | Admitting: Family Medicine

## 2019-07-27 MED ORDER — LUBIPROSTONE 24 MCG PO CAPS: 24.0000 ug | ORAL_CAPSULE | Freq: Two times a day (BID) | ORAL | 5 refills | Status: DC

## 2019-07-27 NOTE — Addendum Note (Signed)
Addended by: Beatrice Lecher D on: 07/27/2019 03:12 PM   Modules accepted: Orders

## 2019-08-23 ENCOUNTER — Ambulatory Visit: Payer: BC Managed Care – PPO | Attending: Internal Medicine

## 2019-08-23 DIAGNOSIS — Z23 Encounter for immunization: Secondary | ICD-10-CM

## 2019-08-23 NOTE — Progress Notes (Signed)
   Covid-19 Vaccination Clinic  Name:  Eddie Hernandez    MRN: [id number] DOB: 02-Apr-1956  08/23/2019  [name] was observed post Covid-19 immunization for 15 minutes without incident. He was provided with Vaccine Information Sheet and instruction to access the V-Safe system.   [name] was instructed to call 911 with any severe reactions post vaccine: Marland Kitchen Difficulty breathing  . Swelling of face and throat  . A fast heartbeat  . A bad rash all over body  . Dizziness and weakness   Immunizations Administered    Name Date Dose VIS Date Route   Moderna COVID-19 Vaccine 08/23/2019  8:31 AM 0.5 mL 05/01/2019 Intramuscular   Manufacturer: Moderna   Lot: [id number]   [id number]

## 2019-09-09 ENCOUNTER — Other Ambulatory Visit: Payer: Self-pay | Admitting: Family Medicine

## 2019-09-10 ENCOUNTER — Other Ambulatory Visit: Payer: Self-pay | Admitting: *Deleted

## 2019-09-13 ENCOUNTER — Other Ambulatory Visit: Payer: Self-pay

## 2019-09-13 ENCOUNTER — Ambulatory Visit (INDEPENDENT_AMBULATORY_CARE_PROVIDER_SITE_OTHER): Payer: BC Managed Care – PPO

## 2019-09-13 ENCOUNTER — Encounter: Payer: Self-pay | Admitting: Sports Medicine

## 2019-09-13 ENCOUNTER — Ambulatory Visit (INDEPENDENT_AMBULATORY_CARE_PROVIDER_SITE_OTHER): Payer: BC Managed Care – PPO | Admitting: Sports Medicine

## 2019-09-13 DIAGNOSIS — M76822 Posterior tibial tendinitis, left leg: Secondary | ICD-10-CM

## 2019-09-13 DIAGNOSIS — S99922A Unspecified injury of left foot, initial encounter: Secondary | ICD-10-CM

## 2019-09-13 DIAGNOSIS — S92502A Displaced unspecified fracture of left lesser toe(s), initial encounter for closed fracture: Secondary | ICD-10-CM

## 2019-09-13 NOTE — Assessment & Plan Note (Signed)
Eddie Hernandez has also noted increasing pain at the medial ankle, referrable behind the malleolus, to the navicular, worse with resisted inversion all consistent with tibialis posterior tendinitis versus navicular stress injury. He does have pes cavus, flexible. I would like him to do tibialis posterior rehab exercises, and get some custom molded orthotics from Dr. Raeford Razor.

## 2019-09-13 NOTE — Assessment & Plan Note (Addendum)
3 weeks ago this pleasant 64 year old male injured his left fifth toe, it was bent sideways, he has a history of alcoholic peripheral neuropathy and did not feel it. He still has significant pain, swelling, pain is localized at the proximal and middle phalanges. Adding x-rays, fourth and fifth toes were buddy taped together. Further treatment will depend on morphology of the fracture on x-rays.  Fracture is nondisplaced to the base of the proximal phalanx, nonangulated, personally reviewed the x-rays myself. Continue buddy taping for 3 to 4 weeks.

## 2019-09-13 NOTE — Progress Notes (Addendum)
    Procedures performed today:    None.  Independent interpretation of notes and tests performed by another provider:   Fracture is nondisplaced to the base of the proximal phalanx, nonangulated, personally reviewed the x-rays myself.  Brief History, Exam, Impression, and Recommendations:    Fracture left fifth toe proximal phalanx 3 weeks ago this pleasant 64 year old male injured his left fifth toe, it was bent sideways, he has a history of alcoholic peripheral neuropathy and did not feel it. He still has significant pain, swelling, pain is localized at the proximal and middle phalanges. Adding x-rays, fourth and fifth toes were buddy taped together. Further treatment will depend on morphology of the fracture on x-rays.  Fracture is nondisplaced to the base of the proximal phalanx, nonangulated, personally reviewed the x-rays myself. Continue buddy taping for 3 to 4 weeks.  Tibialis posterior tendinitis, left Eddie Hernandez has also noted increasing pain at the medial ankle, referrable behind the malleolus, to the navicular, worse with resisted inversion all consistent with tibialis posterior tendinitis versus navicular stress injury. He does have pes cavus, flexible. I would like him to do tibialis posterior rehab exercises, and get some custom molded orthotics from Dr. Raeford Razor.    ___________________________________________ Gwen Her. Dianah Field, M.D., ABFM., CAQSM. Primary Care and [place] Instructor of [place] of [facility]

## 2019-09-19 ENCOUNTER — Encounter: Payer: Self-pay | Admitting: Family Medicine

## 2019-09-19 ENCOUNTER — Other Ambulatory Visit: Payer: Self-pay

## 2019-09-19 ENCOUNTER — Ambulatory Visit (INDEPENDENT_AMBULATORY_CARE_PROVIDER_SITE_OTHER): Payer: BC Managed Care – PPO | Admitting: Family Medicine

## 2019-09-19 DIAGNOSIS — M76822 Posterior tibial tendinitis, left leg: Secondary | ICD-10-CM

## 2019-09-19 NOTE — Assessment & Plan Note (Signed)
Has to stand all day and seems to be worse with prolonged standing.  Does have some pronation with sleep due to excessive strain on the medial aspect. -Orthotics. -Placed metatarsal pads.  Could place additional scaphoid pads

## 2019-09-19 NOTE — Progress Notes (Signed)
LEHI CORNS - 64 y.o. male MRN [id number]  Date of birth: January 10, 1956  SUBJECTIVE:  Including CC & ROS.  Chief Complaint  Patient presents with  . Foot Orthotics    Eddie Hernandez is a 64 y.o. male that is presenting with bilateral foot pain.  He is having pain that is worse at the end of the day.  He tends to have to stand all day at work.  It seems to be worse around his ankle.  He also has a broken toe.   Review of Systems See HPI   HISTORY: Past Medical, Surgical, Social, and Family History Reviewed & Updated per EMR.   Pertinent Historical Findings include:  Past Medical History:  Diagnosis Date  . Alcohol abuse   . Bipolar 1 disorder ([place])   . Hypothyroidism   . PAD (peripheral artery disease) ([place])   . Peripheral neuropathy    small fiber  . Prostate pain   . Tremor     Past Surgical History:  Procedure Laterality Date  . DEEP BRAIN STIMULATOR PLACEMENT  01-30-08   tremors    Family History  Problem Relation Age of Onset  . Stroke Father   . Alcoholism Father   . Cancer Sister        Lung     Social History   Socioeconomic History  . Marital status: Married    Spouse name: Eddie Hernandez   . Number of children: Not on file  . Years of education: Not on file  . Highest education level: Not on file  Occupational History  . Occupation: works in Scientist, research (medical).      Comment: [name]  Tobacco Use  . Smoking status: Former Smoker    Packs/day: 1.00    Years: 45.00    Pack years: 45.00    Types: Cigarettes    Quit date: 09/08/2017    Years since quitting: 2.0  . Smokeless tobacco: Never Used  . Tobacco comment: Counseled to quit smoking  Substance and Sexual Activity  . Alcohol use: No    Alcohol/week: 0.0 standard drinks    Comment: hx of EtOH abuse  . Drug use: No  . Sexual activity: Not on file  Other Topics Concern  . 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 No active exercise.    Social Determinants of Health     Financial Resource Strain:   . Difficulty of Paying Living Expenses:   Food Insecurity:   . Worried About Charity fundraiser in the Last Year:   . Arboriculturist in the Last Year:   Transportation Needs:   . Film/video editor (Medical):   Marland Kitchen Lack of Transportation (Non-Medical):   Physical Activity:   . Days of Exercise per Week:   . Minutes of Exercise per Session:   Stress:   . Feeling of Stress :   Social Connections:   . Frequency of Communication with Friends and Family:   . Frequency of Social Gatherings with Friends and Family:   . Attends Religious Services:   . Active Member of Clubs or Organizations:   . Attends Archivist Meetings:   Marland Kitchen Marital Status:   Intimate Partner Violence:   . Fear of Current or Ex-Partner:   . Emotionally Abused:   Marland Kitchen Physically Abused:   . Sexually Abused:      PHYSICAL EXAM:  VS: BP 125/80   Pulse 83  Ht 6' (1.829 m)   Wt 200 lb (90.7 kg)   BMI 27.12 kg/m  Physical Exam Gen: NAD, alert, cooperative with exam, well-appearing MSK:  Right and left foot: Pronation occurring upon standing.  Loss of the transverse arch. Hammertoes present. Neurovascularly intact  Patient was fitted for a standard, cushioned, semi-rigid orthotic. The orthotic was heated and afterward the patient stood on the orthotic blank positioned on the orthotic stand. The patient was positioned in subtalar neutral position and 10 degrees of ankle dorsiflexion in a weight bearing stance. After completion of molding, a stable base was applied to the orthotic blank. The blank was ground to a stable position for weight bearing. Size: 11 Pairs: 2 Base: Blue EVA Additional Posting and Padding: MT pads The patient ambulated these, and they were very comfortable.    ASSESSMENT & PLAN:   Tibialis posterior tendinitis, left Has to stand all day and seems to be worse with prolonged standing.  Does have some pronation with sleep due to excessive  strain on the medial aspect. -Orthotics. -Placed metatarsal pads.  Could place additional scaphoid pads

## 2019-09-25 ENCOUNTER — Ambulatory Visit: Payer: BC Managed Care – PPO | Attending: Internal Medicine

## 2019-09-25 DIAGNOSIS — Z23 Encounter for immunization: Secondary | ICD-10-CM

## 2019-09-25 NOTE — Progress Notes (Signed)
   Covid-19 Vaccination Clinic  Name:  Eddie Hernandez    MRN: [id number] DOB: 1956/01/12  09/25/2019  [name] was observed post Covid-19 immunization for 15 minutes without incident. He was provided with Vaccine Information Sheet and instruction to access the V-Safe system.   [name] was instructed to call 911 with any severe reactions post vaccine: Marland Kitchen Difficulty breathing  . Swelling of face and throat  . A fast heartbeat  . A bad rash all over body  . Dizziness and weakness   Immunizations Administered    Name Date Dose VIS Date Route   Moderna COVID-19 Vaccine 09/25/2019  9:06 AM 0.5 mL 05/2019 Intramuscular   Manufacturer: Moderna   Lot: [id number]   [id number]

## 2019-09-27 DIAGNOSIS — R7989 Other specified abnormal findings of blood chemistry: Secondary | ICD-10-CM | POA: Insufficient documentation

## 2019-10-11 ENCOUNTER — Ambulatory Visit: Payer: BC Managed Care – PPO | Admitting: Sports Medicine

## 2019-10-13 ENCOUNTER — Other Ambulatory Visit: Payer: Self-pay | Admitting: Family Medicine

## 2019-10-13 DIAGNOSIS — E039 Hypothyroidism, unspecified: Secondary | ICD-10-CM

## 2019-10-15 ENCOUNTER — Other Ambulatory Visit: Payer: Self-pay | Admitting: *Deleted

## 2019-10-15 ENCOUNTER — Ambulatory Visit (INDEPENDENT_AMBULATORY_CARE_PROVIDER_SITE_OTHER): Payer: BC Managed Care – PPO | Admitting: Sports Medicine

## 2019-10-15 ENCOUNTER — Encounter: Payer: Self-pay | Admitting: Sports Medicine

## 2019-10-15 DIAGNOSIS — M76822 Posterior tibial tendinitis, left leg: Secondary | ICD-10-CM | POA: Diagnosis not present

## 2019-10-15 DIAGNOSIS — S92502D Displaced unspecified fracture of left lesser toe(s), subsequent encounter for fracture with routine healing: Secondary | ICD-10-CM | POA: Diagnosis not present

## 2019-10-15 DIAGNOSIS — E039 Hypothyroidism, unspecified: Secondary | ICD-10-CM

## 2019-10-15 LAB — TSH: TSH: 0.09 mIU/L — ABNORMAL LOW (ref 0.40–4.50)

## 2019-10-15 MED ORDER — LEVOTHYROXINE SODIUM 125 MCG PO TABS: 125.0000 ug | ORAL_TABLET | Freq: Every day | ORAL | 0 refills | Status: DC

## 2019-10-15 NOTE — Progress Notes (Signed)
    Procedures performed today:    None.  Independent interpretation of notes and tests performed by another provider:   None.  Brief History, Exam, Impression, and Recommendations:    Fracture left fifth toe proximal phalanx Eddie Hernandez returns, he is a pleasant 64 year old male, he suffered a fracture of his left fifth proximal phalanx. He is much better today, 6 weeks post fracture, he does understand that lower extremity fractures can take 8 weeks or longer with weightbearing. We will simply watch this for now.  Tibialis posterior tendinitis, left Eddie Hernandez also has tibialis posterior tendinitis in his left ankle, he still has significant discomfort in spite of rehab exercises and custom molded orthotics. He would like to give this at least another month to see if he gets better before proceeding with MRI and/or injections.    ___________________________________________ Gwen Her. Dianah Field, M.D., ABFM., CAQSM. Primary Care and [place] Instructor of [place] of [facility] Medicine

## 2019-10-15 NOTE — Assessment & Plan Note (Signed)
Eddie Hernandez also has tibialis posterior tendinitis in his left ankle, he still has significant discomfort in spite of rehab exercises and custom molded orthotics. He would like to give this at least another month to see if he gets better before proceeding with MRI and/or injections.

## 2019-10-15 NOTE — Assessment & Plan Note (Signed)
Eddie Hernandez returns, he is a pleasant 64 year old male, he suffered a fracture of his left fifth proximal phalanx. He is much better today, 6 weeks post fracture, he does understand that lower extremity fractures can take 8 weeks or longer with weightbearing. We will simply watch this for now.

## 2019-10-16 ENCOUNTER — Other Ambulatory Visit: Payer: Self-pay | Admitting: Family Medicine

## 2019-10-16 DIAGNOSIS — E039 Hypothyroidism, unspecified: Secondary | ICD-10-CM

## 2019-10-31 ENCOUNTER — Encounter: Payer: Self-pay | Admitting: Family Medicine

## 2019-11-06 ENCOUNTER — Other Ambulatory Visit: Payer: Self-pay

## 2019-11-06 ENCOUNTER — Ambulatory Visit (INDEPENDENT_AMBULATORY_CARE_PROVIDER_SITE_OTHER): Payer: BC Managed Care – PPO

## 2019-11-06 DIAGNOSIS — F17210 Nicotine dependence, cigarettes, uncomplicated: Secondary | ICD-10-CM

## 2019-11-06 DIAGNOSIS — F17200 Nicotine dependence, unspecified, uncomplicated: Secondary | ICD-10-CM

## 2019-11-06 DIAGNOSIS — Z122 Encounter for screening for malignant neoplasm of respiratory organs: Secondary | ICD-10-CM

## 2019-11-07 LAB — THYROID PANEL WITH TSH
Free Thyroxine Index: 0.9 — ABNORMAL LOW (ref 1.4–3.8)
T3 Uptake: 29 % (ref 22–35)
T4, Total: 3.2 ug/dL — ABNORMAL LOW (ref 4.9–10.5)
TSH: 19.63 mIU/L — ABNORMAL HIGH (ref 0.40–4.50)

## 2019-11-07 LAB — THYROID PEROXIDASE ANTIBODY: Thyroperoxidase Ab SerPl-aCnc: 69 IU/mL — ABNORMAL HIGH (ref <9)

## 2019-11-08 ENCOUNTER — Other Ambulatory Visit: Payer: Self-pay | Admitting: *Deleted

## 2019-11-08 DIAGNOSIS — Z87891 Personal history of nicotine dependence: Secondary | ICD-10-CM

## 2019-11-08 NOTE — Progress Notes (Signed)
Please call patient and let them  know their  low dose Ct was read as a Lung RADS 2: nodules that are benign in appearance and behavior with a very low likelihood of becoming a clinically active cancer due to size or lack of growth. Recommendation per radiology is for a repeat LDCT in 12 months. .Please let them  know we will order and schedule their  annual screening scan for 10/2020. Please let them  know there was notation of CAD on their  scan.  Please remind the patient  that this is a non-gated exam therefore degree or severity of disease  cannot be determined. Please have them  follow up with their PCP regarding potential risk factor modification, dietary therapy or pharmacologic therapy if clinically indicated. Pt.  is  currently on statin therapy. Please place order for annual  screening scan for 10/2020 and fax results to PCP. Thanks so much. 

## 2019-11-12 ENCOUNTER — Ambulatory Visit: Payer: BC Managed Care – PPO | Admitting: Sports Medicine

## 2019-11-12 ENCOUNTER — Ambulatory Visit: Payer: BC Managed Care – PPO | Admitting: Family Medicine

## 2019-11-13 ENCOUNTER — Ambulatory Visit (INDEPENDENT_AMBULATORY_CARE_PROVIDER_SITE_OTHER): Payer: BC Managed Care – PPO | Admitting: Family Medicine

## 2019-11-13 ENCOUNTER — Encounter: Payer: Self-pay | Admitting: Family Medicine

## 2019-11-13 ENCOUNTER — Other Ambulatory Visit: Payer: Self-pay

## 2019-11-13 VITALS — BP 111/65 | HR 83 | Ht 72.0 in | Wt 211.0 lb

## 2019-11-13 DIAGNOSIS — I10 Essential (primary) hypertension: Secondary | ICD-10-CM | POA: Diagnosis not present

## 2019-11-13 DIAGNOSIS — N281 Cyst of kidney, acquired: Secondary | ICD-10-CM

## 2019-11-13 DIAGNOSIS — Z9689 Presence of other specified functional implants: Secondary | ICD-10-CM

## 2019-11-13 DIAGNOSIS — M5030 Other cervical disc degeneration, unspecified cervical region: Secondary | ICD-10-CM | POA: Diagnosis not present

## 2019-11-13 DIAGNOSIS — I700 Atherosclerosis of aorta: Secondary | ICD-10-CM

## 2019-11-13 DIAGNOSIS — E039 Hypothyroidism, unspecified: Secondary | ICD-10-CM

## 2019-11-13 DIAGNOSIS — J432 Centrilobular emphysema: Secondary | ICD-10-CM

## 2019-11-13 DIAGNOSIS — Z8673 Personal history of transient ischemic attack (TIA), and cerebral infarction without residual deficits: Secondary | ICD-10-CM | POA: Insufficient documentation

## 2019-11-13 NOTE — Assessment & Plan Note (Signed)
Last TSH was elevated at 19.6.  We increased his regimen.  He is taking a half a tab of his thyroid medication on Wednesdays and Sundays and 1 pill the other 5 days a week.  Plan to recheck level in 6 to 8 weeks.
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
## 2019-11-13 NOTE — Assessment & Plan Note (Signed)
Discussed findings seen on CT of his neck he has some significant degenerative disc disease as well as some facet hypertrophy that might actually be causing some stenosis.  For now no further intervention recommended.  Certainly if his pain increases or gets worse than it may be worth consultation with an orthopedist and or sports medicine for further treatment options.

## 2019-11-13 NOTE — Assessment & Plan Note (Signed)
No additional work up needed at this time

## 2019-11-13 NOTE — Assessment & Plan Note (Signed)
We discussed trying to maximize his statin.  He says that he had actually gone down to half a tab of his statin because he was getting some muscle aches.  He is not sure if it could have also been from his thyroid being off at the time.  So he is willing to try higher dose again and let me know if he is able to tolerate it.

## 2019-11-13 NOTE — Assessment & Plan Note (Signed)
Based on head CT from 2021 shows old lacunar infarct.  As far as patient is where he did not experience a specific event that he remembered.  We discussed further prevention including controlling his cholesterol by maximizing his statin as well as taking a low-dose aspirin daily.

## 2019-11-13 NOTE — Assessment & Plan Note (Signed)
Noted again on CT.  Just had his yearly CT chest screening and everything was stable.

## 2019-11-13 NOTE — Assessment & Plan Note (Signed)
He is only taking half a tab of the 40mg  lisinopril. BP looks great today. Encouraged him to check BPs at home for the next 2 weeks. If similar to todays pressure then we can dec lisinopril to 10mg  QD.

## 2019-11-20 ENCOUNTER — Encounter: Payer: Self-pay | Admitting: Family Medicine

## 2019-11-23 NOTE — Telephone Encounter (Signed)
Referral placed with Dr. Gloriann Loan

## 2020-01-08 ENCOUNTER — Telehealth: Payer: Self-pay

## 2020-01-08 DIAGNOSIS — E039 Hypothyroidism, unspecified: Secondary | ICD-10-CM

## 2020-01-08 NOTE — Telephone Encounter (Signed)
Received call from [facility] needing labs  Last results from June state: "Hi Eddie Hernandez, TSH elevated which is what I was making sure would happen. I believe he is taking half a tab every day. If yes, then go to whole tab 5 days per week and 1/2 tab 2 days per week. Repeat pattern each week and recheck level in 6 weeks. "  Labs ordered

## 2020-01-09 LAB — THYROID PANEL WITH TSH
Free Thyroxine Index: 1.7 (ref 1.4–3.8)
T3 Uptake: 30 % (ref 22–35)
T4, Total: 5.6 ug/dL (ref 4.9–10.5)
TSH: 7.84 mIU/L — ABNORMAL HIGH (ref 0.40–4.50)

## 2020-01-09 LAB — THYROID PEROXIDASE ANTIBODY: Thyroperoxidase Ab SerPl-aCnc: 99 IU/mL — ABNORMAL HIGH (ref <9)

## 2020-01-10 ENCOUNTER — Other Ambulatory Visit: Payer: Self-pay | Admitting: *Deleted

## 2020-01-10 DIAGNOSIS — E039 Hypothyroidism, unspecified: Secondary | ICD-10-CM

## 2020-01-23 ENCOUNTER — Other Ambulatory Visit: Payer: Self-pay

## 2020-01-23 ENCOUNTER — Encounter: Payer: Self-pay | Admitting: Family Medicine

## 2020-01-23 ENCOUNTER — Ambulatory Visit (INDEPENDENT_AMBULATORY_CARE_PROVIDER_SITE_OTHER): Payer: BC Managed Care – PPO | Admitting: Family Medicine

## 2020-01-23 VITALS — BP 129/80 | HR 66 | Ht 72.0 in | Wt 208.0 lb

## 2020-01-23 DIAGNOSIS — I10 Essential (primary) hypertension: Secondary | ICD-10-CM

## 2020-01-23 DIAGNOSIS — G629 Polyneuropathy, unspecified: Secondary | ICD-10-CM

## 2020-01-23 DIAGNOSIS — Z23 Encounter for immunization: Secondary | ICD-10-CM | POA: Diagnosis not present

## 2020-01-23 DIAGNOSIS — E039 Hypothyroidism, unspecified: Secondary | ICD-10-CM | POA: Diagnosis not present

## 2020-01-23 DIAGNOSIS — R7301 Impaired fasting glucose: Secondary | ICD-10-CM | POA: Diagnosis not present

## 2020-01-23 LAB — POCT GLYCOSYLATED HEMOGLOBIN (HGB A1C): Hemoglobin A1C: 5.7 % — AB (ref 4.0–5.6)

## 2020-01-23 NOTE — Assessment & Plan Note (Signed)
A1c looks fantastic today at 5.7 this is down from previous of 6.1 great work.  He says he has tried to eat a little better.

## 2020-01-23 NOTE — Assessment & Plan Note (Signed)
Doing well thus far on Cymbalta 60 mg daily.  Continue current regimen.

## 2020-01-23 NOTE — Progress Notes (Signed)
Established Patient Office Visit  Subjective:  Patient ID: Eddie Hernandez, male    DOB: 19-Nov-1955  Age: 64 y.o. MRN: [id number]  CC:  Chief Complaint  Patient presents with  . Hypertension  . Hypothyroidism    HPI Eddie Hernandez presents for   Hypertension- Pt denies chest pain, SOB, dizziness, or heart palpitations.  Taking meds as directed w/o problems.  Denies medication side effects.    Hypothyroidism - Taking medication regularly in the AM away from food and vitamins, etc. No recent change to skin, hair, or energy levels.  He has not started the selenium we actually just increased his dose about 2 weeks ago.  For his peripheral neuropathy he says the Cymbalta is actually working well.  He is taking 30 mg twice a day.  When he runs out he will switch to 60 mg once a day he feels like he still getting the persistent numbness but says the pain has actually improved significantly.  He is happy with this regimen and has not noticed any negative effects   Past Medical History:  Diagnosis Date  . Alcohol abuse   . Bipolar 1 disorder ([place])   . Hypothyroidism   . PAD (peripheral artery disease) ([place])   . Peripheral neuropathy    small fiber  . Prostate pain   . Tremor     Past Surgical History:  Procedure Laterality Date  . DEEP BRAIN STIMULATOR PLACEMENT  01-30-08   tremors    Family History  Problem Relation Age of Onset  . Stroke Father   . Alcoholism Father   . Cancer Sister        Lung     Social History   Socioeconomic History  . Marital status: Married    Spouse name: Maudry Mayhew   . Number of children: Not on file  . Years of education: Not on file  . Highest education level: Not on file  Occupational History  . Occupation: works in Scientist, research (medical).      Comment: [name]  Tobacco Use  . Smoking status: Former Smoker    Packs/day: 1.00    Years: 45.00    Pack years: 45.00    Types: Cigarettes    Quit date: 09/08/2017    Years since quitting: 2.3  .  Smokeless tobacco: Never Used  . Tobacco comment: Counseled to quit smoking  Substance and Sexual Activity  . Alcohol use: No    Alcohol/week: 0.0 standard drinks    Comment: hx of EtOH abuse  . Drug use: No  . Sexual activity: Not on file  Other Topics Concern  . 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 No active exercise.    Social Determinants of Health   Financial Resource Strain:   . Difficulty of Paying Living Expenses: Not on file  Food Insecurity:   . Worried About Charity fundraiser in the Last Year: Not on file  . Ran Out of Food in the Last Year: Not on file  Transportation Needs:   . Lack of Transportation (Medical): Not on file  . Lack of Transportation (Non-Medical): Not on file  Physical Activity:   . Days of Exercise per Week: Not on file  . Minutes of Exercise per Session: Not on file  Stress:   . Feeling of Stress : Not on file  Social Connections:   . Frequency of Communication with Friends and Family: Not on  file  . Frequency of Social Gatherings with Friends and Family: Not on file  . Attends Religious Services: Not on file  . Active Member of Clubs or Organizations: Not on file  . Attends Archivist Meetings: Not on file  . Marital Status: Not on file  Intimate Partner Violence:   . Fear of Current or Ex-Partner: Not on file  . Emotionally Abused: Not on file  . Physically Abused: Not on file  . Sexually Abused: Not on file    Outpatient Medications Prior to Visit  Medication Sig Dispense Refill  . ARIPiprazole (ABILIFY) 5 MG tablet TAKE ONE TABLET (5 MG TOTAL) BY MOUTH DAILY 30 tablet 3  . aspirin 81 MG EC tablet Take by mouth.    Marland Kitchen atorvastatin (LIPITOR) 20 MG tablet Take 20 mg by mouth at bedtime.    . DULoxetine (CYMBALTA) 30 MG capsule Take 30 mg by mouth 2 (two) times daily.     Marland Kitchen levothyroxine (SYNTHROID) 125 MCG tablet Take 1 tablet (125 mcg total) by mouth daily. (Patient taking  differently: Take 125 mcg by mouth daily. 1 tablet six (6) days a week, and 1/2 tablet one (1) day.) 90 tablet 0  . lisinopril (ZESTRIL) 20 MG tablet Take 20 mg by mouth daily.    . mirtazapine (REMERON SOL-TAB) 15 MG disintegrating tablet Take 15 mg by mouth daily.    . Multiple Vitamins-Minerals (MULTIVITAMIN MEN 50+) TABS Take 1 tablet by mouth daily.    Marland Kitchen NUVIGIL 250 MG tablet Take 250 mg by mouth daily.    . pentoxifylline (TRENTAL) 400 MG CR tablet TAKE 1 TABLET 3 TIMES A DAY WITH FOOD    . pregabalin (LYRICA) 200 MG capsule Take 1 capsule by mouth 3 (three) times daily.    . protriptyline (VIVACTIL) 10 MG tablet Take 10 mg by mouth 2 (two) times daily.    Marland Kitchen lisinopril (ZESTRIL) 40 MG tablet Take 20 mg by mouth daily.     No facility-administered medications prior to visit.    Allergies  Allergen Reactions  . Brexpiprazole Other (See Comments)    Extreme fatigue, raised BP, SOB  . Sulfa Antibiotics Itching and Rash  . Sulfonamide Derivatives Rash    REACTION: Rash    ROS Review of Systems    Objective:    Physical Exam Constitutional:      Appearance: He is well-developed.  HENT:     Head: Normocephalic and atraumatic.  Cardiovascular:     Rate and Rhythm: Normal rate and regular rhythm.     Heart sounds: Normal heart sounds.  Pulmonary:     Effort: Pulmonary effort is normal.     Breath sounds: Normal breath sounds.  Skin:    General: Skin is warm and dry.  Neurological:     Mental Status: He is alert and oriented to person, place, and time.  Psychiatric:        Behavior: Behavior normal.     BP 129/80   Pulse 66   Ht 6' (1.829 m)   Wt 208 lb (94.3 kg)   SpO2 100%   BMI 28.21 kg/m  Wt Readings from Last 3 Encounters:  01/23/20 208 lb (94.3 kg)  11/13/19 211 lb (95.7 kg)  09/19/19 200 lb (90.7 kg)     There are no preventive care reminders to display for this patient.  There are no preventive care reminders to display for this patient.  Lab  Results  Component Value Date   TSH  7.84 (H) 01/08/2020   Lab Results  Component Value Date   WBC 6.0 09/29/2018   HGB 13.3 09/29/2018   HCT 39.3 09/29/2018   MCV 91.4 09/29/2018   PLT 259 09/29/2018   Lab Results  Component Value Date   NA 138 07/23/2019   K 5.2 07/23/2019   CO2 27 07/23/2019   GLUCOSE 113 07/23/2019   BUN 14 07/23/2019   CREATININE 0.84 07/23/2019   BILITOT 0.3 07/23/2019   ALKPHOS 93 04/20/2016   AST 24 07/23/2019   ALT 31 07/23/2019   PROT 6.1 07/23/2019   ALBUMIN 4.2 04/20/2016   CALCIUM 9.0 07/23/2019   Lab Results  Component Value Date   CHOL 139 09/29/2018   Lab Results  Component Value Date   HDL 44 09/29/2018   Lab Results  Component Value Date   LDLCALC 65 09/29/2018   Lab Results  Component Value Date   TRIG 239 (H) 09/29/2018   Lab Results  Component Value Date   CHOLHDL 3.2 09/29/2018   Lab Results  Component Value Date   HGBA1C 5.7 (A) 01/23/2020      Assessment & Plan:   Problem List Items Addressed This Visit      Cardiovascular and Mediastinum   Essential hypertension    Well controlled. Continue current regimen. Follow up in  6 months.       Relevant Medications   lisinopril (ZESTRIL) 20 MG tablet   Other Relevant Orders   Lipid Panel w/reflex Direct LDL   COMPLETE METABOLIC PANEL WITH GFR     Endocrine   IFG (impaired fasting glucose) - Primary    A1c looks fantastic today at 5.7 this is down from previous of 6.1 great work.  He says he has tried to eat a little better.      Relevant Orders   POCT glycosylated hemoglobin (Hb A1C) (Completed)   Hypothyroidism    We just made an adjustment to his regimen about 2 weeks ago so he is taking a whole tab daily except 1 day a week he is taking half a tab.  So we increased his dose by half a tab per week.  He will go and recheck TSH in about 4 weeks.  So encouraged him to start selenium since his thyroid peroxidase antibody levels were pretty high.       Relevant Orders   TSH     Nervous and Auditory   Peripheral polyneuropathy    Doing well thus far on Cymbalta 60 mg daily.  Continue current regimen.       Other Visit Diagnoses    Need for immunization against influenza       Relevant Orders   Flu Vaccine QUAD 36+ mos IM (Completed)      No orders of the defined types were placed in this encounter.   Follow-up: Return in about 6 months (around 07/25/2020) for Hypertension adn thyroid.   Time spent 25 min in encounter Beatrice Lecher, MD

## 2020-01-23 NOTE — Progress Notes (Signed)
Pt reports that he did not start the selenium supplement that was advised when he got his last TSH results back

## 2020-01-23 NOTE — Assessment & Plan Note (Addendum)
We just made an adjustment to his regimen about 2 weeks ago so he is taking a whole tab daily except 1 day a week he is taking half a tab.  So we increased his dose by half a tab per week.  He will go and recheck TSH in about 4 weeks.  So encouraged him to start selenium since his thyroid peroxidase antibody levels were pretty high.

## 2020-01-23 NOTE — Assessment & Plan Note (Signed)
Well controlled. Continue current regimen. Follow up in  6 months.  

## 2020-02-27 ENCOUNTER — Other Ambulatory Visit: Payer: Self-pay

## 2020-02-27 ENCOUNTER — Other Ambulatory Visit: Payer: Self-pay | Admitting: *Deleted

## 2020-02-27 DIAGNOSIS — E039 Hypothyroidism, unspecified: Secondary | ICD-10-CM

## 2020-02-27 MED ORDER — LEVOTHYROXINE SODIUM 125 MCG PO TABS: ORAL_TABLET | ORAL | 0 refills | Status: DC

## 2020-03-06 ENCOUNTER — Encounter: Payer: Self-pay | Admitting: Family Medicine

## 2020-03-06 LAB — COMPLETE METABOLIC PANEL WITH GFR
AG Ratio: 1.7 (calc) (ref 1.0–2.5)
ALT: 21 U/L (ref 9–46)
AST: 23 U/L (ref 10–35)
Albumin: 4 g/dL (ref 3.6–5.1)
Alkaline phosphatase (APISO): 93 U/L (ref 35–144)
BUN: 15 mg/dL (ref 7–25)
CO2: 28 mmol/L (ref 20–32)
Calcium: 8.8 mg/dL (ref 8.6–10.3)
Chloride: 104 mmol/L (ref 98–110)
Creat: 0.93 mg/dL (ref 0.70–1.25)
GFR, Est African American: 101 mL/min/{1.73_m2} (ref >=60)
GFR, Est Non African American: 87 mL/min/{1.73_m2} (ref >=60)
Globulin: 2.3 g/dL (calc) (ref 1.9–3.7)
Glucose, Bld: 105 mg/dL — ABNORMAL HIGH (ref 65–99)
Potassium: 4.7 mmol/L (ref 3.5–5.3)
Sodium: 137 mmol/L (ref 135–146)
Total Bilirubin: 0.2 mg/dL (ref 0.2–1.2)
Total Protein: 6.3 g/dL (ref 6.1–8.1)

## 2020-03-06 LAB — LIPID PANEL W/REFLEX DIRECT LDL
Cholesterol: 111 mg/dL (ref <200)
HDL: 44 mg/dL (ref >=40)
LDL Cholesterol (Calc): 50 mg/dL (calc)
Non-HDL Cholesterol (Calc): 67 mg/dL (calc) (ref <130)
Total CHOL/HDL Ratio: 2.5 (calc) (ref <5.0)
Triglycerides: 91 mg/dL (ref <150)

## 2020-03-06 LAB — TSH: TSH: 8.4 mIU/L — ABNORMAL HIGH (ref 0.40–4.50)

## 2020-03-28 ENCOUNTER — Telehealth: Payer: Self-pay | Admitting: Family Medicine

## 2020-03-28 ENCOUNTER — Other Ambulatory Visit: Payer: Self-pay

## 2020-03-28 ENCOUNTER — Encounter: Payer: Self-pay | Admitting: Nurse Practitioner

## 2020-03-28 ENCOUNTER — Ambulatory Visit (INDEPENDENT_AMBULATORY_CARE_PROVIDER_SITE_OTHER): Payer: BC Managed Care – PPO | Admitting: Nurse Practitioner

## 2020-03-28 VITALS — BP 105/64 | HR 103 | Ht 72.0 in | Wt 205.0 lb

## 2020-03-28 DIAGNOSIS — K5909 Other constipation: Secondary | ICD-10-CM | POA: Diagnosis not present

## 2020-03-28 DIAGNOSIS — K219 Gastro-esophageal reflux disease without esophagitis: Secondary | ICD-10-CM

## 2020-03-28 DIAGNOSIS — E039 Hypothyroidism, unspecified: Secondary | ICD-10-CM

## 2020-03-28 MED ORDER — PANTOPRAZOLE SODIUM 40 MG PO TBEC: 40.0000 mg | DELAYED_RELEASE_TABLET | Freq: Every day | ORAL | 3 refills | Status: DC

## 2020-03-28 NOTE — Progress Notes (Signed)
Acute Office Visit  Subjective:    Patient ID: Eddie Hernandez, male    DOB: [birth year], 64 y.o.   MRN: [id number]  No chief complaint on file.   HPI Eddie Hernandez is a 64 year old man presenting today with symptoms of esophageal epigastric and right upper quadrant pain that he has been experiencing for approximately the past 2 weeks.  He reports that he has had some intermittent nausea, most often when he has not eaten.  He reports when he does eat he experiences a sensation of a "ice cube getting stuck in the throat and down the esophagus".  He reports that his symptoms are worse after eating.  He does tell me that he has started drinking smoothies and protein shakes as opposed to eating meals due to his symptoms. He does endorse coffee and caffeine consumption on a daily basis.  He tells me that he did have reflux issues in the past and used Protonix which was helpful.  He states last night when he was experiencing symptoms he tried Gaviscon and it helped minimally.   He denies worsening of symptoms at night when he lays down, but reports that he does take medication to help him rest and that could mask his symptoms. He denies blood in stool, vomiting, weight loss, chest pain, left arm pain, jaw pain, or radiating pain.   Patient also reports that he has been dealing with constipation "forever it seems like".  He reports that he has been utilizing Metamucil every day and his protein meal replacement shakes and he will put a scoop of MiraLAX in the shake every other day.  He states that this has been somewhat helpful however his stools are not as soft as he would like them to be.  Past Medical History:  Diagnosis Date  . Alcohol abuse   . Bipolar 1 disorder ([place])   . Hypothyroidism   . PAD (peripheral artery disease) ([place])   . Peripheral neuropathy    small fiber  . Prostate pain   . Tremor     Past Surgical History:  Procedure Laterality Date  . DEEP BRAIN STIMULATOR PLACEMENT  01-30-08    tremors    Family History  Problem Relation Age of Onset  . Stroke Father   . Alcoholism Father   . Cancer Sister        Lung     Social History   Socioeconomic History  . Marital status: Married    Spouse name: Maudry Mayhew   . Number of children: Not on file  . Years of education: Not on file  . Highest education level: Not on file  Occupational History  . Occupation: works in Scientist, research (medical).      Comment: [name]  Tobacco Use  . Smoking status: Former Smoker    Packs/day: 1.00    Years: 45.00    Pack years: 45.00    Types: Cigarettes    Quit date: 09/08/2017    Years since quitting: 2.5  . Smokeless tobacco: Never Used  . Tobacco comment: Counseled to quit smoking  Substance and Sexual Activity  . Alcohol use: No    Alcohol/week: 0.0 standard drinks    Comment: hx of EtOH abuse  . Drug use: No  . Sexual activity: Not on file  Other Topics Concern  . 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 No active exercise.    Social Determinants of Health   Financial  Resource Strain:   . Difficulty of Paying Living Expenses: Not on file  Food Insecurity:   . Worried About Charity fundraiser in the Last Year: Not on file  . Ran Out of Food in the Last Year: Not on file  Transportation Needs:   . Lack of Transportation (Medical): Not on file  . Lack of Transportation (Non-Medical): Not on file  Physical Activity:   . Days of Exercise per Week: Not on file  . Minutes of Exercise per Session: Not on file  Stress:   . Feeling of Stress : Not on file  Social Connections:   . Frequency of Communication with Friends and Family: Not on file  . Frequency of Social Gatherings with Friends and Family: Not on file  . Attends Religious Services: Not on file  . Active Member of Clubs or Organizations: Not on file  . Attends Archivist Meetings: Not on file  . Marital Status: Not on file  Intimate Partner Violence:   . Fear of Current or  Ex-Partner: Not on file  . Emotionally Abused: Not on file  . Physically Abused: Not on file  . Sexually Abused: Not on file    Outpatient Medications Prior to Visit  Medication Sig Dispense Refill  . ARIPiprazole (ABILIFY) 5 MG tablet TAKE ONE TABLET (5 MG TOTAL) BY MOUTH DAILY 30 tablet 3  . aspirin 81 MG EC tablet Take by mouth.    Marland Kitchen atorvastatin (LIPITOR) 20 MG tablet Take 20 mg by mouth at bedtime.    . DULoxetine (CYMBALTA) 30 MG capsule Take 30 mg by mouth 2 (two) times daily.     Marland Kitchen levothyroxine (SYNTHROID) 125 MCG tablet 1 tablet six (6) days a week, and 1/2 tablet one (1) day. 45 tablet 0  . lisinopril (ZESTRIL) 20 MG tablet Take 20 mg by mouth daily.    . mirtazapine (REMERON SOL-TAB) 15 MG disintegrating tablet Take 15 mg by mouth daily.    . Multiple Vitamins-Minerals (MULTIVITAMIN MEN 50+) TABS Take 1 tablet by mouth daily.    Marland Kitchen NUVIGIL 250 MG tablet Take 250 mg by mouth daily.    . pentoxifylline (TRENTAL) 400 MG CR tablet TAKE 1 TABLET 3 TIMES A DAY WITH FOOD    . pregabalin (LYRICA) 200 MG capsule Take 1 capsule by mouth 3 (three) times daily.    . protriptyline (VIVACTIL) 10 MG tablet Take 10 mg by mouth 2 (two) times daily.     No facility-administered medications prior to visit.    Allergies  Allergen Reactions  . Brexpiprazole Other (See Comments)    Extreme fatigue, raised BP, SOB  . Sulfa Antibiotics Itching and Rash  . Sulfonamide Derivatives Rash    REACTION: Rash    Review of Systems ROS negative except for what is listed in the HPI.    Objective:    Physical Exam Vitals and nursing note reviewed.  Constitutional:      Appearance: Normal appearance.  HENT:     Head: Normocephalic.  Eyes:     Extraocular Movements: Extraocular movements intact.     Conjunctiva/sclera: Conjunctivae normal.     Pupils: Pupils are equal, round, and reactive to light.  Cardiovascular:     Rate and Rhythm: Normal rate and regular rhythm.     Pulses: Normal pulses.      Heart sounds: Normal heart sounds.  Pulmonary:     Effort: Pulmonary effort is normal.     Breath sounds: Normal breath sounds.  Abdominal:     General: Abdomen is flat. Bowel sounds are normal. There is distension.     Palpations: Abdomen is soft. There is no mass.     Tenderness: There is abdominal tenderness in the epigastric area. There is no guarding or rebound.     Hernia: No hernia is present.     Comments: Epigastric tenderness noted on palpation.  Musculoskeletal:        General: Normal range of motion.     Cervical back: Normal range of motion.  Skin:    General: Skin is warm and dry.     Capillary Refill: Capillary refill takes less than 2 seconds.  Neurological:     General: No focal deficit present.     Mental Status: He is alert and oriented to person, place, and time.  Psychiatric:        Mood and Affect: Mood normal.        Behavior: Behavior normal.        Thought Content: Thought content normal.        Judgment: Judgment normal.     BP 105/64   Pulse (!) 103   Ht 6' (1.829 m)   Wt 205 lb (93 kg)   BMI 27.80 kg/m  Wt Readings from Last 3 Encounters:  03/28/20 205 lb (93 kg)  01/23/20 208 lb (94.3 kg)  11/13/19 211 lb (95.7 kg)    There are no preventive care reminders to display for this patient.  There are no preventive care reminders to display for this patient.   Lab Results  Component Value Date   TSH 8.40 (H) 03/05/2020   Lab Results  Component Value Date   WBC 6.0 09/29/2018   HGB 13.3 09/29/2018   HCT 39.3 09/29/2018   MCV 91.4 09/29/2018   PLT 259 09/29/2018   Lab Results  Component Value Date   NA 137 03/05/2020   K 4.7 03/05/2020   CO2 28 03/05/2020   GLUCOSE 105 (H) 03/05/2020   BUN 15 03/05/2020   CREATININE 0.93 03/05/2020   BILITOT 0.2 03/05/2020   ALKPHOS 93 04/20/2016   AST 23 03/05/2020   ALT 21 03/05/2020   PROT 6.3 03/05/2020   ALBUMIN 4.2 04/20/2016   CALCIUM 8.8 03/05/2020   Lab Results  Component  Value Date   CHOL 111 03/05/2020   Lab Results  Component Value Date   HDL 44 03/05/2020   Lab Results  Component Value Date   LDLCALC 50 03/05/2020   Lab Results  Component Value Date   TRIG 91 03/05/2020   Lab Results  Component Value Date   CHOLHDL 2.5 03/05/2020   Lab Results  Component Value Date   HGBA1C 5.7 (A) 01/23/2020       Assessment & Plan:   Problem List Items Addressed This Visit      Digestive   GERD - Primary    Symptoms and presentation consistent with exacerbation of gastroesophageal reflux disease. Discussed with the patient food choices that may help reduce or lessen his symptoms. We will plan to restart pantoprazole 40 mg daily.  Discussed with patient if pantoprazole is not effective in helping with his symptoms in about 2 weeks, we will consider a H. pylori test or referral to GI for further evaluation.  Follow-up if symptoms worsen or fail to improve.        Relevant Medications   pantoprazole (PROTONIX) 40 MG tablet   Chronic constipation    Symptoms and presentation consistent  with chronic constipation. This but certainly could be related to some of the medications he is taking but also could be related to uncontrolled hypothyroidism. Recommend patient continue to use Metamucil daily and MiraLAX every other day as this is helpful with symptoms.  Did discuss some other options that may be helpful to decrease water reabsorption from the stool such as adding chia seeds to his protein shakes.  Patient to follow-up if symptoms worsen or new symptoms develop.           Meds ordered this encounter  Medications  . pantoprazole (PROTONIX) 40 MG tablet    Sig: Take 1 tablet (40 mg total) by mouth daily.    Dispense:  30 tablet    Refill:  3     Orma Render, NP

## 2020-03-28 NOTE — Telephone Encounter (Signed)
Pt requesting TSH Lab order be sent down to lab so he can go next week

## 2020-03-28 NOTE — Assessment & Plan Note (Signed)
Symptoms and presentation consistent with chronic constipation. This but certainly could be related to some of the medications he is taking but also could be related to uncontrolled hypothyroidism. Recommend patient continue to use Metamucil daily and MiraLAX every other day as this is helpful with symptoms.  Did discuss some other options that may be helpful to decrease water reabsorption from the stool such as adding chia seeds to his protein shakes.  Patient to follow-up if symptoms worsen or new symptoms develop.

## 2020-03-28 NOTE — Assessment & Plan Note (Signed)
Symptoms and presentation consistent with exacerbation of gastroesophageal reflux disease. Discussed with the patient food choices that may help reduce or lessen his symptoms. We will plan to restart pantoprazole 40 mg daily.  Discussed with patient if pantoprazole is not effective in helping with his symptoms in about 2 weeks, we will consider a H. pylori test or referral to GI for further evaluation.  Follow-up if symptoms worsen or fail to improve.

## 2020-03-28 NOTE — Addendum Note (Signed)
Addended by: Teddy Spike on: 03/28/2020 12:17 PM   Modules accepted: Orders

## 2020-03-28 NOTE — Telephone Encounter (Addendum)
Lab faxed. lvm advising pt.

## 2020-03-28 NOTE — Patient Instructions (Signed)
I am going to call in a medication called Protonix (pantoprazole) to help with your GERD. You will take one tablet per day, every day to prevent symptoms from returning.    If your symptoms are not improved in about 2 weeks, please let me know.   Gastroesophageal Reflux Disease, Adult Gastroesophageal reflux (GER) happens when acid from the stomach flows up into the tube that connects the mouth and the stomach (esophagus). Normally, food travels down the esophagus and stays in the stomach to be digested. With GER, food and stomach acid sometimes move back up into the esophagus. You may have a disease called gastroesophageal reflux disease (GERD) if the reflux:  Happens often.  Causes frequent or very bad symptoms.  Causes problems such as damage to the esophagus. When this happens, the esophagus becomes sore and swollen (inflamed). Over time, GERD can make small holes (ulcers) in the lining of the esophagus. What are the causes? This condition is caused by a problem with the muscle between the esophagus and the stomach. When this muscle is weak or not normal, it does not close properly to keep food and acid from coming back up from the stomach. The muscle can be weak because of:  Tobacco use.  Pregnancy.  Having a certain type of hernia (hiatal hernia).  Alcohol use.  Certain foods and drinks, such as coffee, chocolate, onions, and peppermint. What increases the risk? You are more likely to develop this condition if you:  Are overweight.  Have a disease that affects your connective tissue.  Use NSAID medicines. What are the signs or symptoms? Symptoms of this condition include:  Heartburn.  Difficult or painful swallowing.  The feeling of having a lump in the throat.  A bitter taste in the mouth.  Bad breath.  Having a lot of saliva.  Having an upset or bloated stomach.  Belching.  Chest pain. Different conditions can cause chest pain. Make sure you see your  doctor if you have chest pain.  Shortness of breath or noisy breathing (wheezing).  Ongoing (chronic) cough or a cough at night.  Wearing away of the surface of teeth (tooth enamel).  Weight loss. How is this treated? Treatment will depend on how bad your symptoms are. Your doctor may suggest:  Changes to your diet.  Medicine.  Surgery. Follow these instructions at home: Eating and drinking   Follow a diet as told by your doctor. You may need to avoid foods and drinks such as: ? Coffee and tea (with or without caffeine). ? Drinks that contain alcohol. ? Energy drinks and sports drinks. ? Bubbly (carbonated) drinks or sodas. ? Chocolate and cocoa. ? Peppermint and mint flavorings. ? Garlic and onions. ? Horseradish. ? Spicy and acidic foods. These include peppers, chili powder, curry powder, vinegar, hot sauces, and BBQ sauce. ? Citrus fruit juices and citrus fruits, such as oranges, lemons, and limes. ? Tomato-based foods. These include red sauce, chili, salsa, and pizza with red sauce. ? Fried and fatty foods. These include donuts, french fries, potato chips, and high-fat dressings. ? High-fat meats. These include hot dogs, rib eye steak, sausage, ham, and bacon. ? High-fat dairy items, such as whole milk, butter, and cream cheese.  Eat small meals often. Avoid eating large meals.  Avoid drinking large amounts of liquid with your meals.  Avoid eating meals during the 2-3 hours before bedtime.  Avoid lying down right after you eat.  Do not exercise right after you eat. Lifestyle  Do not use any products that contain nicotine or tobacco. These include cigarettes, e-cigarettes, and chewing tobacco. If you need help quitting, ask your doctor.  Try to lower your stress. If you need help doing this, ask your doctor.  If you are overweight, lose an amount of weight that is healthy for you. Ask your doctor about a safe weight loss goal. General instructions  Pay  attention to any changes in your symptoms.  Take over-the-counter and prescription medicines only as told by your doctor. Do not take aspirin, ibuprofen, or other NSAIDs unless your doctor says it is okay.  Wear loose clothes. Do not wear anything tight around your waist.  Raise (elevate) the head of your bed about 6 inches (15 cm).  Avoid bending over if this makes your symptoms worse.  Keep all follow-up visits as told by your doctor. This is important. Contact a doctor if:  You have new symptoms.  You lose weight and you do not know why.  You have trouble swallowing or it hurts to swallow.  You have wheezing or a cough that keeps happening.  Your symptoms do not get better with treatment.  You have a hoarse voice. Get help right away if:  You have pain in your arms, neck, jaw, teeth, or back.  You feel sweaty, dizzy, or light-headed.  You have chest pain or shortness of breath.  You throw up (vomit) and your throw-up looks like blood or coffee grounds.  You pass out (faint).  Your poop (stool) is bloody or black.  You cannot swallow, drink, or eat. Summary  If a person has gastroesophageal reflux disease (GERD), food and stomach acid move back up into the esophagus and cause symptoms or problems such as damage to the esophagus.  Treatment will depend on how bad your symptoms are.  Follow a diet as told by your doctor.  Take all medicines only as told by your doctor. This information is not intended to replace advice given to you by your health care provider. Make sure you discuss any questions you have with your health care provider. Document Revised: 11/23/2017 Document Reviewed: 11/23/2017 Elsevier Patient Education  2020 [place] for Gastroesophageal Reflux Disease, Adult When you have gastroesophageal reflux disease (GERD), the foods you eat and your eating habits are very important. Choosing the right foods can help ease your  discomfort. Think about working with a nutrition specialist (dietitian) to help you make good choices. What are tips for following this plan?  Meals  Choose healthy foods that are low in fat, such as fruits, vegetables, whole grains, low-fat dairy products, and lean meat, fish, and poultry.  Eat small meals often instead of 3 large meals a day. Eat your meals slowly, and in a place where you are relaxed. Avoid bending over or lying down until 2-3 hours after eating.  Avoid eating meals 2-3 hours before bed.  Avoid drinking a lot of liquid with meals.  Cook foods using methods other than frying. Bake, grill, or broil food instead.  Avoid or limit: ? Chocolate. ? Peppermint or spearmint. ? Alcohol. ? Pepper. ? Black and decaffeinated coffee. ? Black and decaffeinated tea. ? Bubbly (carbonated) soft drinks. ? Caffeinated energy drinks and soft drinks.  Limit high-fat foods such as: ? Fatty meat or fried foods. ? Whole milk, cream, butter, or ice cream. ? Nuts and nut butters. ? Pastries, donuts, and sweets made with butter or shortening.  Avoid foods that cause  symptoms. These foods may be different for everyone. Common foods that cause symptoms include: ? Tomatoes. ? Oranges, lemons, and limes. ? Peppers. ? Spicy food. ? Onions and garlic. ? Vinegar. Lifestyle  Maintain a healthy weight. Ask your doctor what weight is healthy for you. If you need to lose weight, work with your doctor to do so safely.  Exercise for at least 30 minutes for 5 or more days each week, or as told by your doctor.  Wear loose-fitting clothes.  Do not smoke. If you need help quitting, ask your doctor.  Sleep with the head of your bed higher than your feet. Use a wedge under the mattress or blocks under the bed frame to raise the head of the bed. Summary  When you have gastroesophageal reflux disease (GERD), food and lifestyle choices are very important in easing your symptoms.  Eat small  meals often instead of 3 large meals a day. Eat your meals slowly, and in a place where you are relaxed.  Limit high-fat foods such as fatty meat or fried foods.  Avoid bending over or lying down until 2-3 hours after eating.  Avoid peppermint and spearmint, caffeine, alcohol, and chocolate. This information is not intended to replace advice given to you by your health care provider. Make sure you discuss any questions you have with your health care provider. Document Revised: 09/07/2018 Document Reviewed: 06/22/2016 Elsevier Patient Education  [place].

## 2020-04-04 ENCOUNTER — Ambulatory Visit: Payer: BC Managed Care – PPO | Admitting: Family Medicine

## 2020-04-10 LAB — TSH: TSH: 1.66 mIU/L (ref 0.40–4.50)

## 2020-04-11 ENCOUNTER — Other Ambulatory Visit: Payer: Self-pay

## 2020-04-11 ENCOUNTER — Other Ambulatory Visit: Payer: Self-pay | Admitting: Family Medicine

## 2020-04-11 DIAGNOSIS — E039 Hypothyroidism, unspecified: Secondary | ICD-10-CM

## 2020-04-11 NOTE — Progress Notes (Signed)
Thyroid testing ordered for pt to complete in 3- 4 months.

## 2020-05-06 ENCOUNTER — Other Ambulatory Visit: Payer: Self-pay | Admitting: Family Medicine

## 2020-05-06 DIAGNOSIS — E039 Hypothyroidism, unspecified: Secondary | ICD-10-CM

## 2020-05-07 ENCOUNTER — Other Ambulatory Visit: Payer: Self-pay

## 2020-05-07 DIAGNOSIS — E039 Hypothyroidism, unspecified: Secondary | ICD-10-CM

## 2020-05-07 MED ORDER — LEVOTHYROXINE SODIUM 125 MCG PO TABS: ORAL_TABLET | ORAL | 3 refills | Status: DC

## 2020-06-19 ENCOUNTER — Other Ambulatory Visit: Payer: Self-pay | Admitting: Nurse Practitioner

## 2020-06-19 DIAGNOSIS — K219 Gastro-esophageal reflux disease without esophagitis: Secondary | ICD-10-CM

## 2020-07-24 ENCOUNTER — Ambulatory Visit (INDEPENDENT_AMBULATORY_CARE_PROVIDER_SITE_OTHER): Payer: BC Managed Care – PPO

## 2020-07-24 ENCOUNTER — Encounter: Payer: Self-pay | Admitting: Family Medicine

## 2020-07-24 ENCOUNTER — Other Ambulatory Visit: Payer: Self-pay

## 2020-07-24 ENCOUNTER — Ambulatory Visit (INDEPENDENT_AMBULATORY_CARE_PROVIDER_SITE_OTHER): Payer: BC Managed Care – PPO | Admitting: Family Medicine

## 2020-07-24 VITALS — BP 131/68 | HR 72 | Ht 72.0 in | Wt 209.0 lb

## 2020-07-24 DIAGNOSIS — M79645 Pain in left finger(s): Secondary | ICD-10-CM

## 2020-07-24 DIAGNOSIS — G8929 Other chronic pain: Secondary | ICD-10-CM | POA: Diagnosis not present

## 2020-07-24 DIAGNOSIS — R7301 Impaired fasting glucose: Secondary | ICD-10-CM | POA: Diagnosis not present

## 2020-07-24 DIAGNOSIS — E039 Hypothyroidism, unspecified: Secondary | ICD-10-CM | POA: Diagnosis not present

## 2020-07-24 DIAGNOSIS — I10 Essential (primary) hypertension: Secondary | ICD-10-CM

## 2020-07-24 DIAGNOSIS — Z8673 Personal history of transient ischemic attack (TIA), and cerebral infarction without residual deficits: Secondary | ICD-10-CM

## 2020-07-24 DIAGNOSIS — I700 Atherosclerosis of aorta: Secondary | ICD-10-CM

## 2020-07-24 DIAGNOSIS — Z23 Encounter for immunization: Secondary | ICD-10-CM

## 2020-07-24 LAB — POCT GLYCOSYLATED HEMOGLOBIN (HGB A1C): Hemoglobin A1C: 6.1 % — AB (ref 4.0–5.6)

## 2020-07-24 NOTE — Assessment & Plan Note (Signed)
His thyroid level looks great 3 months ago so we can recheck it again today if it still looks normal then we will plan to recheck again in 6 months and then we can go to yearly after that if he is stable.

## 2020-07-24 NOTE — Assessment & Plan Note (Signed)
Right now he is tolerating his daily statin well without any significant problems or side effects.  Last LDL was less than 70.  Unfortunately he was not able to tolerate the low-dose aspirin.  It caused GI upset even with  adding a PPI

## 2020-07-24 NOTE — Assessment & Plan Note (Signed)
We will get x-rays today.  Again the brace does seem to help he would likely benefit from a steroid injection into the joint he does a lot of gripping and heavy lifting at work which is probably continuing to irritate the area he is planning on working for probably another 2 years before retiring.

## 2020-07-24 NOTE — Assessment & Plan Note (Signed)
Well controlled. Continue current regimen. Follow up in  6 mo  

## 2020-07-24 NOTE — Progress Notes (Signed)
Established Patient Office Visit  Subjective:  Patient ID: Eddie Hernandez, male    DOB: 04-21-1956  Age: 65 y.o. MRN: [id number]  CC:  Chief Complaint  Patient presents with  . Hypertension  . ifg  . Hypothyroidism    HPI Eddie Hernandez presents for   Hypertension- Pt denies chest pain, SOB, dizziness, or heart palpitations.  Taking meds as directed w/o problems.  Denies medication side effects.    Impaired fasting glucose-no increased thirst or urination. No symptoms consistent with hypoglycemia.  Hypothyroidism - Taking medication regularly in the AM away from food and vitamins, etc. No recent change to skin, hair, or energy levels.  Last TSH had come down to 1.6  Recently stopped PPI started by one of my partners for GERD.   He was started on it initially because after we had restarted him on aspirin because of his prior history of stroke and peripheral vascular disease he was experiencing significant GI irritation.  He says even taking the PPI daily really did not help so he ended up stopping both.  He really does not want to continue the aspirin  Also reports some pain at the base of his left thumb at the wrist area.  He sometimes wears the brace at work in fact he wears it most times unless he is doing some dirty work and says it does help some but is just getting to the point where it is painful and swollen.  Gust need for shingles vaccine.    Past Medical History:  Diagnosis Date  . Alcohol abuse   . Bipolar 1 disorder ([place])   . Hypothyroidism   . PAD (peripheral artery disease) ([place])   . Peripheral neuropathy    small fiber  . Prostate pain   . Tremor     Past Surgical History:  Procedure Laterality Date  . DEEP BRAIN STIMULATOR PLACEMENT  01-30-08   tremors    Family History  Problem Relation Age of Onset  . Stroke Father   . Alcoholism Father   . Cancer Sister        Lung     Social History   Socioeconomic History  . Marital status: Married     Spouse name: Eddie Hernandez   . Number of children: Not on file  . Years of education: Not on file  . Highest education level: Not on file  Occupational History  . Occupation: works in Scientist, research (medical).      Comment: [name]  Tobacco Use  . Smoking status: Former Smoker    Packs/day: 1.00    Years: 45.00    Pack years: 45.00    Types: Cigarettes    Quit date: 09/08/2017    Years since quitting: 2.8  . Smokeless tobacco: Never Used  . Tobacco comment: Counseled to quit smoking  Substance and Sexual Activity  . Alcohol use: No    Alcohol/week: 0.0 standard drinks    Comment: hx of EtOH abuse  . Drug use: No  . Sexual activity: Not on file  Other Topics Concern  . 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 No active exercise.    Social Determinants of Health   Financial Resource Strain: Not on file  Food Insecurity: Not on file  Transportation Needs: Not on file  Physical Activity: Not on file  Stress: Not on file  Social Connections: Not on file  Intimate Partner Violence: Not on file  Outpatient Medications Prior to Visit  Medication Sig Dispense Refill  . ARIPiprazole (ABILIFY) 5 MG tablet TAKE ONE TABLET (5 MG TOTAL) BY MOUTH DAILY 30 tablet 3  . atorvastatin (LIPITOR) 20 MG tablet Take 20 mg by mouth at bedtime.    . DULoxetine (CYMBALTA) 30 MG capsule Take 30 mg by mouth 2 (two) times daily.     Marland Kitchen levothyroxine (SYNTHROID) 125 MCG tablet TAKE 1 TABLET BY MOUTH SIX (6) DAYS A WEEK, AND 1/2 TABLET ONE (1) DAY. 45 tablet 3  . lisinopril (ZESTRIL) 20 MG tablet Take 20 mg by mouth daily.    . mirtazapine (REMERON SOL-TAB) 15 MG disintegrating tablet Take 15 mg by mouth daily.    . Multiple Vitamins-Minerals (MULTIVITAMIN MEN 50+) TABS Take 1 tablet by mouth daily.    Marland Kitchen NUVIGIL 250 MG tablet Take 250 mg by mouth daily.    . pentoxifylline (TRENTAL) 400 MG CR tablet TAKE 1 TABLET 3 TIMES A DAY WITH FOOD    . pregabalin (LYRICA) 200 MG capsule Take 1  capsule by mouth 3 (three) times daily.    . protriptyline (VIVACTIL) 10 MG tablet Take 10 mg by mouth 2 (two) times daily.    Marland Kitchen aspirin 81 MG EC tablet Take by mouth.    . pantoprazole (PROTONIX) 40 MG tablet TAKE 1 TABLET BY MOUTH EVERY DAY 90 tablet 1   No facility-administered medications prior to visit.    Allergies  Allergen Reactions  . Brexpiprazole Other (See Comments)    Extreme fatigue, raised BP, SOB  . Sulfa Antibiotics Itching and Rash  . Sulfonamide Derivatives Rash    REACTION: Rash    ROS Review of Systems    Objective:    Physical Exam Constitutional:      Appearance: He is well-developed and well-nourished.  HENT:     Head: Normocephalic and atraumatic.  Cardiovascular:     Rate and Rhythm: Normal rate and regular rhythm.     Heart sounds: Normal heart sounds.  Pulmonary:     Effort: Pulmonary effort is normal.     Breath sounds: Normal breath sounds.  Skin:    General: Skin is warm and dry.  Neurological:     Mental Status: He is alert and oriented to person, place, and time.  Psychiatric:        Mood and Affect: Mood and affect normal.        Behavior: Behavior normal.     BP 131/68   Pulse 72   Ht 6' (1.829 m)   Wt 209 lb (94.8 kg)   SpO2 100%   BMI 28.35 kg/m  Wt Readings from Last 3 Encounters:  07/24/20 209 lb (94.8 kg)  03/28/20 205 lb (93 kg)  01/23/20 208 lb (94.3 kg)     There are no preventive care reminders to display for this patient.  There are no preventive care reminders to display for this patient.  Lab Results  Component Value Date   TSH 1.66 04/10/2020   Lab Results  Component Value Date   WBC 6.0 09/29/2018   HGB 13.3 09/29/2018   HCT 39.3 09/29/2018   MCV 91.4 09/29/2018   PLT 259 09/29/2018   Lab Results  Component Value Date   NA 137 03/05/2020   K 4.7 03/05/2020   CO2 28 03/05/2020   GLUCOSE 105 (H) 03/05/2020   BUN 15 03/05/2020   CREATININE 0.93 03/05/2020   BILITOT 0.2 03/05/2020   ALKPHOS  93 04/20/2016  AST 23 03/05/2020   ALT 21 03/05/2020   PROT 6.3 03/05/2020   ALBUMIN 4.2 04/20/2016   CALCIUM 8.8 03/05/2020   Lab Results  Component Value Date   CHOL 111 03/05/2020   Lab Results  Component Value Date   HDL 44 03/05/2020   Lab Results  Component Value Date   LDLCALC 50 03/05/2020   Lab Results  Component Value Date   TRIG 91 03/05/2020   Lab Results  Component Value Date   CHOLHDL 2.5 03/05/2020   Lab Results  Component Value Date   HGBA1C 6.1 (A) 07/24/2020      Assessment & Plan:   Problem List Items Addressed This Visit      Cardiovascular and Mediastinum   Essential hypertension    Well controlled. Continue current regimen. Follow up in  6 mo       Relevant Orders   TSH   BASIC METABOLIC PANEL WITH GFR   Aortic atherosclerosis (HCC) - Primary    Continue daily statin.  The ASA was upsetting his stomach even with the PPI so he has stopped it.       Relevant Orders   CBC     Endocrine   IFG (impaired fasting glucose)    A1C did go up to 6.1 today. F/U in 6 months. Continue to work on diet. He walks about 3 miles most days at work.       Relevant Orders   POCT glycosylated hemoglobin (Hb A1C) (Completed)   TSH   BASIC METABOLIC PANEL WITH GFR   Hypothyroidism    His thyroid level looks great 3 months ago so we can recheck it again today if it still looks normal then we will plan to recheck again in 6 months and then we can go to yearly after that if he is stable.      Relevant Orders   TSH     Other   History of lacunar cerebrovascular accident    Right now he is tolerating his daily statin well without any significant problems or side effects.  Last LDL was less than 70.  Unfortunately he was not able to tolerate the low-dose aspirin.  It caused GI upset even with  adding a PPI      Chronic pain of left thumb    We will get x-rays today.  Again the brace does seem to help he would likely benefit from a steroid injection  into the joint he does a lot of gripping and heavy lifting at work which is probably continuing to irritate the area he is planning on working for probably another 2 years before retiring.      Relevant Orders   DG Hand Complete Left    Other Visit Diagnoses    Need for Zostavax administration       Relevant Orders   Varicella-zoster vaccine IM (Shingrix) (Completed)     shingrix given.  Repeat dose in 6 months.     No orders of the defined types were placed in this encounter.   Follow-up: Return in about 6 months (around 01/21/2021) for Hypertension and A1C .    Beatrice Lecher, MD

## 2020-07-24 NOTE — Assessment & Plan Note (Signed)
Continue daily statin.  The ASA was upsetting his stomach even with the PPI so he has stopped it.

## 2020-07-24 NOTE — Assessment & Plan Note (Signed)
A1C did go up to 6.1 today. F/U in 6 months. Continue to work on diet. He walks about 3 miles most days at work.

## 2020-07-25 ENCOUNTER — Other Ambulatory Visit: Payer: Self-pay

## 2020-07-25 ENCOUNTER — Encounter: Payer: Self-pay | Admitting: Family Medicine

## 2020-07-25 DIAGNOSIS — E039 Hypothyroidism, unspecified: Secondary | ICD-10-CM

## 2020-07-25 LAB — CBC
HCT: 36.2 % — ABNORMAL LOW (ref 38.5–50.0)
Hemoglobin: 11.9 g/dL — ABNORMAL LOW (ref 13.2–17.1)
MCH: 28.8 pg (ref 27.0–33.0)
MCHC: 32.9 g/dL (ref 32.0–36.0)
MCV: 87.7 fL (ref 80.0–100.0)
MPV: 11 fL (ref 7.5–12.5)
Platelets: 252 10*3/uL (ref 140–400)
RBC: 4.13 10*6/uL — ABNORMAL LOW (ref 4.20–5.80)
RDW: 13.8 % (ref 11.0–15.0)
WBC: 6.4 10*3/uL (ref 3.8–10.8)

## 2020-07-25 LAB — BASIC METABOLIC PANEL WITH GFR
BUN: 12 mg/dL (ref 7–25)
CO2: 27 mmol/L (ref 20–32)
Calcium: 8.7 mg/dL (ref 8.6–10.3)
Chloride: 103 mmol/L (ref 98–110)
Creat: 0.82 mg/dL (ref 0.70–1.25)
GFR, Est African American: 108 mL/min/{1.73_m2} (ref >=60)
GFR, Est Non African American: 93 mL/min/{1.73_m2} (ref >=60)
Glucose, Bld: 110 mg/dL — ABNORMAL HIGH (ref 65–99)
Potassium: 4.8 mmol/L (ref 3.5–5.3)
Sodium: 137 mmol/L (ref 135–146)

## 2020-07-25 LAB — TSH: TSH: 0.69 mIU/L (ref 0.40–4.50)

## 2020-08-03 ENCOUNTER — Other Ambulatory Visit: Payer: Self-pay | Admitting: Family Medicine

## 2020-08-03 DIAGNOSIS — E039 Hypothyroidism, unspecified: Secondary | ICD-10-CM

## 2020-08-21 ENCOUNTER — Ambulatory Visit (INDEPENDENT_AMBULATORY_CARE_PROVIDER_SITE_OTHER): Payer: BC Managed Care – PPO | Admitting: Family Medicine

## 2020-08-21 ENCOUNTER — Other Ambulatory Visit: Payer: Self-pay

## 2020-08-21 ENCOUNTER — Ambulatory Visit (INDEPENDENT_AMBULATORY_CARE_PROVIDER_SITE_OTHER): Payer: BC Managed Care – PPO

## 2020-08-21 ENCOUNTER — Encounter: Payer: Self-pay | Admitting: Family Medicine

## 2020-08-21 VITALS — BP 128/76 | HR 91 | Ht 72.0 in | Wt 209.0 lb

## 2020-08-21 DIAGNOSIS — R1084 Generalized abdominal pain: Secondary | ICD-10-CM

## 2020-08-21 DIAGNOSIS — K5900 Constipation, unspecified: Secondary | ICD-10-CM

## 2020-08-21 DIAGNOSIS — R911 Solitary pulmonary nodule: Secondary | ICD-10-CM

## 2020-08-21 DIAGNOSIS — R140 Abdominal distension (gaseous): Secondary | ICD-10-CM

## 2020-08-21 NOTE — Progress Notes (Signed)
Established Patient Office Visit  Subjective:  Patient ID: Eddie Hernandez, male    DOB: 1955-07-29  Age: 65 y.o. MRN: [id number]  CC:  Chief Complaint  Patient presents with  . Abdominal Pain  . Bloated  . Constipation  . Gas  . Back Pain    HPI [name] presents for 1 week of inc constipation.  Tried Miralax over the weekend and then tried mag citrate and finally had a CM.  He is gassy.  He is having some LUQ pain that radiates into his back.  Occ getting right abdominal pain as well.  He feels he has lost weight. He feels ilke his right leg has been painful with a burning sensation.  He says it almost feels like the pain he was having in his legs before he had the iliac stents placed.  He is not sure if some of the distention and bloating are having an effect on those areas where the stents are.  No fever or chills.  No significant nausea or vomiting.  No blood in the stool.  Past Medical History:  Diagnosis Date  . Alcohol abuse   . Bipolar 1 disorder ([place])   . Hypothyroidism   . PAD (peripheral artery disease) ([place])   . Peripheral neuropathy    small fiber  . Prostate pain   . Tremor     Past Surgical History:  Procedure Laterality Date  . DEEP BRAIN STIMULATOR PLACEMENT  01-30-08   tremors    Family History  Problem Relation Age of Onset  . Stroke Father   . Alcoholism Father   . Cancer Sister        Lung     Social History   Socioeconomic History  . Marital status: Married    Spouse name: Maudry Mayhew   . Number of children: Not on file  . Years of education: Not on file  . Highest education level: Not on file  Occupational History  . Occupation: works in Scientist, research (medical).      Comment: [name]  Tobacco Use  . Smoking status: Former Smoker    Packs/day: 1.00    Years: 45.00    Pack years: 45.00    Types: Cigarettes    Quit date: 09/08/2017    Years since quitting: 2.9  . Smokeless tobacco: Never Used  . Tobacco comment: Counseled to quit smoking   Substance and Sexual Activity  . Alcohol use: No    Alcohol/week: 0.0 standard drinks    Comment: hx of EtOH abuse  . Drug use: No  . Sexual activity: Not on file  Other Topics Concern  . 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 No active exercise.    Social Determinants of Health   Financial Resource Strain: Not on file  Food Insecurity: Not on file  Transportation Needs: Not on file  Physical Activity: Not on file  Stress: Not on file  Social Connections: Not on file  Intimate Partner Violence: Not on file    Outpatient Medications Prior to Visit  Medication Sig Dispense Refill  . ARIPiprazole (ABILIFY) 5 MG tablet TAKE ONE TABLET (5 MG TOTAL) BY MOUTH DAILY 30 tablet 3  . atorvastatin (LIPITOR) 20 MG tablet Take 20 mg by mouth at bedtime.    . DULoxetine (CYMBALTA) 30 MG capsule Take 30 mg by mouth 2 (two) times daily.     Marland Kitchen levothyroxine (SYNTHROID) 125 MCG tablet  TAKE 1 TABLET BY MOUTH 6 DAYS A WEEK, AND 1/2 TABLET 1 DAY. 135 tablet 1  . lisinopril (ZESTRIL) 20 MG tablet Take 20 mg by mouth daily.    . mirtazapine (REMERON SOL-TAB) 15 MG disintegrating tablet Take 15 mg by mouth daily.    . Multiple Vitamins-Minerals (MULTIVITAMIN MEN 50+) TABS Take 1 tablet by mouth daily.    Marland Kitchen NUVIGIL 250 MG tablet Take 250 mg by mouth daily.    . pentoxifylline (TRENTAL) 400 MG CR tablet TAKE 1 TABLET 3 TIMES A DAY WITH FOOD    . pregabalin (LYRICA) 200 MG capsule Take 1 capsule by mouth 3 (three) times daily.    . protriptyline (VIVACTIL) 10 MG tablet Take 10 mg by mouth 2 (two) times daily.     No facility-administered medications prior to visit.    Allergies  Allergen Reactions  . Brexpiprazole Other (See Comments)    Extreme fatigue, raised BP, SOB  . Sulfa Antibiotics Itching and Rash  . Sulfonamide Derivatives Rash    REACTION: Rash    ROS Review of Systems    Objective:    Physical Exam Constitutional:      Appearance: He  is well-developed.  HENT:     Head: Normocephalic and atraumatic.  Cardiovascular:     Rate and Rhythm: Normal rate and regular rhythm.     Heart sounds: Normal heart sounds.  Pulmonary:     Effort: Pulmonary effort is normal.     Breath sounds: Normal breath sounds.  Abdominal:     Tenderness: There is generalized abdominal tenderness. There is no guarding or rebound.  Skin:    General: Skin is warm and dry.  Neurological:     Mental Status: He is alert and oriented to person, place, and time.  Psychiatric:        Behavior: Behavior normal.     BP 128/76   Pulse 91   Ht 6' (1.829 m)   Wt 209 lb (94.8 kg)   SpO2 98%   BMI 28.35 kg/m  Wt Readings from Last 3 Encounters:  08/21/20 209 lb (94.8 kg)  07/24/20 209 lb (94.8 kg)  03/28/20 205 lb (93 kg)     There are no preventive care reminders to display for this patient.  There are no preventive care reminders to display for this patient.  Lab Results  Component Value Date   TSH 0.69 07/24/2020   Lab Results  Component Value Date   WBC 6.4 07/24/2020   HGB 11.9 (L) 07/24/2020   HCT 36.2 (L) 07/24/2020   MCV 87.7 07/24/2020   PLT 252 07/24/2020   Lab Results  Component Value Date   NA 137 07/24/2020   K 4.8 07/24/2020   CO2 27 07/24/2020   GLUCOSE 110 (H) 07/24/2020   BUN 12 07/24/2020   CREATININE 0.82 07/24/2020   BILITOT 0.2 03/05/2020   ALKPHOS 93 04/20/2016   AST 23 03/05/2020   ALT 21 03/05/2020   PROT 6.3 03/05/2020   ALBUMIN 4.2 04/20/2016   CALCIUM 8.7 07/24/2020   Lab Results  Component Value Date   CHOL 111 03/05/2020   Lab Results  Component Value Date   HDL 44 03/05/2020   Lab Results  Component Value Date   LDLCALC 50 03/05/2020   Lab Results  Component Value Date   TRIG 91 03/05/2020   Lab Results  Component Value Date   CHOLHDL 2.5 03/05/2020   Lab Results  Component Value Date   HGBA1C  6.1 (A) 07/24/2020      Assessment & Plan:   Problem List Items Addressed  This Visit   None   Visit Diagnoses    Generalized abdominal pain    -  Primary   Relevant Orders   DG Abd 1 View   Constipation, unspecified constipation type       Relevant Orders   DG Abd 1 View   Bloating       Relevant Orders   DG Abd 1 View     Generalized abdominal pain bloating with constipation-he did get a small amount of relief with the magnesium citrate encouraged him to take another dose tonight to try to get his bowels moving I really think this is just constipation.  We will get a KUB today just to rule out other causes.  No fever chills, vomiting or blood in the stool to indicate more worrisome findings.  He just has generalized tenderness on exam no rebound or guarding.  If tomorrow he feels like he is getting worse please let us know immediately and we can get updated blood labs as well as a stat CT if needed   No orders of the defined types were placed in this encounter.   Follow-up: No follow-ups on file.    Beatrice Lecher, MD

## 2020-08-21 NOTE — Progress Notes (Signed)
Pt reports that he has had issues with constipation for years. He stated that he had 5 days where he was constipated.   On Friday and Saturday he tried miralax with no relief. On Sunday he took Magnesium

## 2020-08-24 ENCOUNTER — Encounter: Payer: Self-pay | Admitting: Family Medicine

## 2020-08-29 ENCOUNTER — Encounter: Payer: Self-pay | Admitting: Family Medicine

## 2020-09-03 ENCOUNTER — Telehealth: Payer: Self-pay | Admitting: *Deleted

## 2020-09-03 DIAGNOSIS — R1084 Generalized abdominal pain: Secondary | ICD-10-CM

## 2020-09-03 DIAGNOSIS — K5900 Constipation, unspecified: Secondary | ICD-10-CM

## 2020-09-03 DIAGNOSIS — R140 Abdominal distension (gaseous): Secondary | ICD-10-CM

## 2020-09-03 MED ORDER — LUBIPROSTONE 24 MCG PO CAPS: 24.0000 ug | ORAL_CAPSULE | Freq: Two times a day (BID) | ORAL | 0 refills | Status: DC

## 2020-09-03 NOTE — Telephone Encounter (Signed)
CT ordered. Also called in [place]

## 2020-09-03 NOTE — Telephone Encounter (Signed)
Pt lvm stating that he hasn't had a BM in several days, experiencing  Back pain, LLQ and RLQ pain, dizziness.   Pt states that today is the first day of feeling really dizzy,he feels uncomfortable. He asked if he should go to the ED.    Seen by cards this morning for continued leg burning he has an appointment for a vascular study next week for this.

## 2020-09-03 NOTE — Telephone Encounter (Signed)
Spoke w/pt. He said that his sxs have returned.   He called earlier today to be seen but there were no openings. He asked if he should go to the ED to be seen. I told him that I would send this to Dr. Madilyn Fireman for advice.   Also looked back at the notes from his last OV on 08/21/20 it was recommended that:  If tomorrow he feels like he is getting worse please let us know immediately and we can get updated blood labs as well as a stat CT if needed

## 2020-09-04 NOTE — Telephone Encounter (Signed)
Pt advised of prescription and CT order.

## 2020-09-05 ENCOUNTER — Telehealth: Payer: Self-pay | Admitting: *Deleted

## 2020-09-05 DIAGNOSIS — R1084 Generalized abdominal pain: Secondary | ICD-10-CM

## 2020-09-05 DIAGNOSIS — R140 Abdominal distension (gaseous): Secondary | ICD-10-CM

## 2020-09-05 NOTE — Telephone Encounter (Signed)
Labs ordered for CT with contrast  

## 2020-09-06 LAB — BASIC METABOLIC PANEL
BUN: 9 mg/dL (ref 7–25)
CO2: 28 mmol/L (ref 20–32)
Calcium: 9.5 mg/dL (ref 8.6–10.3)
Chloride: 105 mmol/L (ref 98–110)
Creat: 0.96 mg/dL (ref 0.70–1.25)
Glucose, Bld: 118 mg/dL — ABNORMAL HIGH (ref 65–99)
Potassium: 4.7 mmol/L (ref 3.5–5.3)
Sodium: 141 mmol/L (ref 135–146)

## 2020-09-08 ENCOUNTER — Other Ambulatory Visit: Payer: Self-pay

## 2020-09-08 ENCOUNTER — Ambulatory Visit (INDEPENDENT_AMBULATORY_CARE_PROVIDER_SITE_OTHER): Payer: BC Managed Care – PPO

## 2020-09-08 DIAGNOSIS — K5900 Constipation, unspecified: Secondary | ICD-10-CM | POA: Diagnosis not present

## 2020-09-08 DIAGNOSIS — R1084 Generalized abdominal pain: Secondary | ICD-10-CM

## 2020-09-08 DIAGNOSIS — R140 Abdominal distension (gaseous): Secondary | ICD-10-CM

## 2020-09-08 MED ORDER — IOHEXOL 300 MG/ML  SOLN
100.0000 mL | Freq: Once | INTRAMUSCULAR | Status: AC | PRN
  Administered 2020-09-08: 100 mL via INTRAVENOUS

## 2020-09-09 ENCOUNTER — Other Ambulatory Visit: Payer: Self-pay | Admitting: Family Medicine

## 2020-09-09 DIAGNOSIS — R918 Other nonspecific abnormal finding of lung field: Secondary | ICD-10-CM

## 2020-09-09 DIAGNOSIS — R1084 Generalized abdominal pain: Secondary | ICD-10-CM

## 2020-09-10 ENCOUNTER — Ambulatory Visit (INDEPENDENT_AMBULATORY_CARE_PROVIDER_SITE_OTHER): Payer: BC Managed Care – PPO

## 2020-09-10 ENCOUNTER — Other Ambulatory Visit: Payer: Self-pay

## 2020-09-10 DIAGNOSIS — R599 Enlarged lymph nodes, unspecified: Secondary | ICD-10-CM

## 2020-09-10 DIAGNOSIS — R918 Other nonspecific abnormal finding of lung field: Secondary | ICD-10-CM | POA: Diagnosis not present

## 2020-09-10 LAB — C-REACTIVE PROTEIN: CRP: 18.5 mg/L — ABNORMAL HIGH (ref <8.0)

## 2020-09-10 LAB — SEDIMENTATION RATE: Sed Rate: 25 mm/h — ABNORMAL HIGH (ref 0–20)

## 2020-09-10 MED ORDER — IOHEXOL 300 MG/ML  SOLN
100.0000 mL | Freq: Once | INTRAMUSCULAR | Status: AC | PRN
  Administered 2020-09-10: 75 mL via INTRAVENOUS

## 2020-09-15 ENCOUNTER — Ambulatory Visit (INDEPENDENT_AMBULATORY_CARE_PROVIDER_SITE_OTHER): Payer: BC Managed Care – PPO | Admitting: Sports Medicine

## 2020-09-15 ENCOUNTER — Other Ambulatory Visit: Payer: Self-pay | Admitting: Family Medicine

## 2020-09-15 ENCOUNTER — Other Ambulatory Visit: Payer: Self-pay

## 2020-09-15 DIAGNOSIS — D499 Neoplasm of unspecified behavior of unspecified site: Secondary | ICD-10-CM | POA: Diagnosis not present

## 2020-09-15 DIAGNOSIS — K5909 Other constipation: Secondary | ICD-10-CM | POA: Diagnosis not present

## 2020-09-15 DIAGNOSIS — R972 Elevated prostate specific antigen [PSA]: Secondary | ICD-10-CM

## 2020-09-15 DIAGNOSIS — R918 Other nonspecific abnormal finding of lung field: Secondary | ICD-10-CM

## 2020-09-15 DIAGNOSIS — R911 Solitary pulmonary nodule: Secondary | ICD-10-CM | POA: Diagnosis not present

## 2020-09-15 MED ORDER — PREDNISONE 50 MG PO TABS: ORAL_TABLET | ORAL | 0 refills | Status: DC

## 2020-09-15 MED ORDER — AZITHROMYCIN 250 MG PO TABS: ORAL_TABLET | ORAL | 0 refills | Status: DC

## 2020-09-15 MED ORDER — LUBIPROSTONE 8 MCG PO CAPS: 8.0000 ug | ORAL_CAPSULE | Freq: Two times a day (BID) | ORAL | 3 refills | Status: DC

## 2020-09-15 NOTE — Assessment & Plan Note (Addendum)
This is a pleasant 65 year old male, he has been managed for some time now for constipation, ultimately a abdominal pelvic CT showed a potential pulmonary nodule, ultimately follow-up chest CT showed a 10 mm left lower lobe pulmonary nodule, potentially spiculated, there were several other nodules throughout the chest and a potential paratracheal enlarged lymph node. No personal history of cancer. Last colonoscopy was in 2018 and clear. He is a 45-pack-year smoker, quit in 2019. He does have a PET CT scan full body ordered, his most recent screening chest CT from last year did not show these nodules. We are to go ahead with a course of prednisone and azithromycin for the potential that this is an infectious nodule, I am also going to check some tumor markers including a CA 19-9, CEA, as well as a PSA. Further follow-up will certainly be through his PCP and depend on what we see on the PET scan.  Update: PSA and CEA are elevated to a degree, awaiting PET scan.  We will probably hand this workup off to his PCP.

## 2020-09-15 NOTE — Progress Notes (Signed)
FYI.Marland KitchenMarland Kitchen PET CT ordered.  Seen Dr. Dianah Field today as well.

## 2020-09-15 NOTE — Assessment & Plan Note (Signed)
Eddie Hernandez does likely have some degree of chronic idiopathic constipation, he has tried multiple regimens including mag citrate, colonoscopy prep, and now Amitiza at full dose. It sounds like Amitiza worked, but 24 mcg is too much, dropping to 8 mcg. He will continue a high-fiber diet. Last colonoscopy was in 2018 and clear. Of note he is on a tricyclic antidepressant, we should certainly consider stopping this if he does not get good efficacy with Amitiza 8.

## 2020-09-15 NOTE — Progress Notes (Addendum)
    Procedures performed today:    None.  Independent interpretation of notes and tests performed by another provider:   None.  Brief History, Exam, Impression, and Recommendations:    Pulmonary nodule This is a pleasant 65 year old male, he has been managed for some time now for constipation, ultimately a abdominal pelvic CT showed a potential pulmonary nodule, ultimately follow-up chest CT showed a 10 mm left lower lobe pulmonary nodule, potentially spiculated, there were several other nodules throughout the chest and a potential paratracheal enlarged lymph node. No personal history of cancer. Last colonoscopy was in 2018 and clear. He is a 45-pack-year smoker, quit in 2019. He does have a PET CT scan full body ordered, his most recent screening chest CT from last year did not show these nodules. We are to go ahead with a course of prednisone and azithromycin for the potential that this is an infectious nodule, I am also going to check some tumor markers including a CA 19-9, CEA, as well as a PSA. Further follow-up will certainly be through his PCP and depend on what we see on the PET scan.  Update: PSA and CEA are elevated to a degree, awaiting PET scan.  We will probably hand this workup off to his PCP.  Chronic constipation Mavrik does likely have some degree of chronic idiopathic constipation, he has tried multiple regimens including mag citrate, colonoscopy prep, and now Amitiza at full dose. It sounds like Amitiza worked, but 24 mcg is too much, dropping to 8 mcg. He will continue a high-fiber diet. Last colonoscopy was in 2018 and clear. Of note he is on a tricyclic antidepressant, we should certainly consider stopping this if he does not get good efficacy with Amitiza 8.  Elevated PSA Elevated PSA, nothing seen on thoracolumbar CT, he did have some pulmonary nodules, prostate cancer metastasis to the lung is uncommon in the absence of bone or lymph spread. I would like  urology to weigh in regarding his high PSA.    ___________________________________________ Gwen Her. Dianah Field, M.D., ABFM., CAQSM. Primary Care and [place] Instructor of [place] [facility] [address]

## 2020-09-16 NOTE — Addendum Note (Signed)
Addended by: Beatrice Lecher D on: 09/16/2020 10:19 PM   Modules accepted: Orders

## 2020-09-17 ENCOUNTER — Telehealth: Payer: Self-pay | Admitting: Neurology

## 2020-09-17 DIAGNOSIS — R972 Elevated prostate specific antigen [PSA]: Secondary | ICD-10-CM | POA: Insufficient documentation

## 2020-09-17 LAB — CANCER ANTIGEN 19-9: CA 19-9: 7 U/mL (ref <34)

## 2020-09-17 LAB — QUANTIFERON-TB GOLD PLUS
Mitogen-NIL: >10 IU/mL
NIL: 0.05 IU/mL
QuantiFERON-TB Gold Plus: NEGATIVE
TB1-NIL: <0 IU/mL
TB2-NIL: <0 IU/mL

## 2020-09-17 LAB — PSA, TOTAL AND FREE
PSA, Free: 0.7 ng/mL
PSA, Total: 17.1 ng/mL — ABNORMAL HIGH (ref <=4.0)

## 2020-09-17 LAB — CEA: CEA: 3.1 ng/mL — ABNORMAL HIGH

## 2020-09-17 LAB — AFP TUMOR MARKER: AFP-Tumor Marker: 3.4 ng/mL (ref <6.1)

## 2020-09-17 NOTE — Assessment & Plan Note (Signed)
Elevated PSA, nothing seen on thoracolumbar CT, he did have some pulmonary nodules, prostate cancer metastasis to the lung is uncommon in the absence of bone or lymph spread. I would like urology to weigh in regarding his high PSA.

## 2020-09-17 NOTE — Addendum Note (Signed)
Addended by: Silverio Decamp on: 09/17/2020 09:48 AM   Modules accepted: Orders

## 2020-09-17 NOTE — Telephone Encounter (Addendum)
Patient left a vm asking for a call back from [place]. [phone].

## 2020-09-18 ENCOUNTER — Telehealth: Payer: Self-pay | Admitting: Family Medicine

## 2020-09-18 NOTE — Telephone Encounter (Signed)
Patient dropped off "release of information forms"?? for PCP, placed in provider box. ANM

## 2020-09-18 NOTE — Telephone Encounter (Signed)
Spoke with patient, he states Dr. Madilyn Fireman called him yesterday. He is just waiting on insurance approval for scans. He doesn't need anything further at this time.

## 2020-09-21 ENCOUNTER — Encounter: Payer: Self-pay | Admitting: Family Medicine

## 2020-09-22 ENCOUNTER — Telehealth: Payer: Self-pay | Admitting: *Deleted

## 2020-09-22 NOTE — Telephone Encounter (Signed)
Forms completed.  Please make sure to attach charge sheet.  Placed in tiny abuse box.

## 2020-09-22 NOTE — Telephone Encounter (Signed)
Pt called and LVM wanting Dr. Madilyn Fireman to know that he called [facility] and he has an appointment for his PET scan on 5/4

## 2020-09-23 ENCOUNTER — Encounter: Payer: Self-pay | Admitting: Family Medicine

## 2020-09-23 ENCOUNTER — Telehealth: Payer: Self-pay | Admitting: *Deleted

## 2020-09-23 MED ORDER — GAVILYTE-C 240 G PO SOLR: 4000.0000 mL | Freq: Once | ORAL | 0 refills | Status: AC

## 2020-09-23 MED ORDER — DICYCLOMINE HCL 10 MG PO CAPS: 10.0000 mg | ORAL_CAPSULE | Freq: Three times a day (TID) | ORAL | 0 refills | Status: DC

## 2020-09-23 NOTE — Telephone Encounter (Signed)
Pt wanted to know if Dr. Madilyn Fireman has seen his patient message that he sent on 4/24 concerning his lower abdomen pain and stool burden.   He wanted to know what else he can do?

## 2020-09-23 NOTE — Telephone Encounter (Signed)
Form completed,faxed,confirmation received and scanned into patient's chart.  Pt informed of $29 fee for this and that he can come by and pay this

## 2020-09-23 NOTE — Telephone Encounter (Signed)
Cindy, I know you were calling to check on his Pulm referral.  Were you able to get in touch with someone? Please update pt as well.

## 2020-09-23 NOTE — Telephone Encounter (Signed)
Reply sent today to MyChart

## 2020-09-23 NOTE — Telephone Encounter (Signed)
Patient picked up the forms & paid the $29.00 fee. My apologies for not placing a billing form attached to them, as I didn't realize it was forms to be filled out. AM

## 2020-09-24 NOTE — Telephone Encounter (Addendum)
Dr. Madilyn Fireman    They were supposed to call him to schedule as soon as we got off the phone. It does look like he is scheduled with Pulmonary on 5/5 and has a PET Scan scheduled at [facility] on 5/4. I will call him to make sure they contacted him about the appointment and didn't just schedule it.   Jenny Reichmann

## 2020-09-29 ENCOUNTER — Telehealth: Payer: Self-pay | Admitting: Family Medicine

## 2020-09-29 ENCOUNTER — Other Ambulatory Visit: Payer: Self-pay

## 2020-09-29 ENCOUNTER — Other Ambulatory Visit: Payer: Self-pay | Admitting: Family Medicine

## 2020-09-29 ENCOUNTER — Ambulatory Visit (INDEPENDENT_AMBULATORY_CARE_PROVIDER_SITE_OTHER): Payer: BC Managed Care – PPO | Admitting: Family Medicine

## 2020-09-29 ENCOUNTER — Encounter: Payer: Self-pay | Admitting: Family Medicine

## 2020-09-29 VITALS — BP 135/71 | HR 109 | Ht 72.0 in | Wt 199.0 lb

## 2020-09-29 DIAGNOSIS — R1084 Generalized abdominal pain: Secondary | ICD-10-CM | POA: Diagnosis not present

## 2020-09-29 DIAGNOSIS — I739 Peripheral vascular disease, unspecified: Secondary | ICD-10-CM

## 2020-09-29 DIAGNOSIS — R634 Abnormal weight loss: Secondary | ICD-10-CM

## 2020-09-29 MED ORDER — TRAMADOL HCL 50 MG PO TABS: 50.0000 mg | ORAL_TABLET | Freq: Three times a day (TID) | ORAL | 0 refills | Status: DC | PRN

## 2020-09-29 NOTE — Telephone Encounter (Signed)
Patient dropped off paperwork after leaving appt today. Billing form attached and placed in provider box. 09/29/20. AM

## 2020-09-29 NOTE — Progress Notes (Signed)
Established Patient Office Visit  Subjective:  Patient ID: Eddie Hernandez, male    DOB: 10-19-1955  Age: 65 y.o. MRN: [id number]  CC:  Chief Complaint  Patient presents with  . Follow-up    HPI Eddie Hernandez presents for follow-up of his persistent abdominal pain.  The pain was felt to most likely be secondary to constipation because he is really been struggling with it.  He was started on some over-the-counter products and then MiraLAX but really was not getting great results.  He was then started on Amitiza 24 mcg daily but it was causing diarrhea so he is back down to 8 mcg twice a day and then up to 3 times a day over the weekend but still only moving his bowels every 3 days.  He still having a lot of pain and discomfort that starts in the lower pelvis on the right and left side radiates around to both hips and then radiates around to his low back area.  He says the pain is now more constant than it was previously.  His abdominal pain is worse when he eats he says in fact he usually has to go lay down flat to get some relief after he eats and has been eating smaller amounts.  He has lost another 10 pounds since he was here about 4 weeks ago.  Past Medical History:  Diagnosis Date  . Alcohol abuse   . Bipolar 1 disorder ([place])   . Hypothyroidism   . PAD (peripheral artery disease) ([place])   . Peripheral neuropathy    small fiber  . Prostate pain   . Tremor     Past Surgical History:  Procedure Laterality Date  . DEEP BRAIN STIMULATOR PLACEMENT  01-30-08   tremors    Family History  Problem Relation Age of Onset  . Stroke Father   . Alcoholism Father   . Cancer Sister        Lung     Social History   Socioeconomic History  . Marital status: Married    Spouse name: Maudry Mayhew   . Number of children: Not on file  . Years of education: Not on file  . Highest education level: Not on file  Occupational History  . Occupation: works in Scientist, research (medical).      Comment: [name]   Tobacco Use  . Smoking status: Former Smoker    Packs/day: 1.00    Years: 45.00    Pack years: 45.00    Types: Cigarettes    Quit date: 09/08/2017    Years since quitting: 3.0  . Smokeless tobacco: Never Used  . Tobacco comment: Counseled to quit smoking  Substance and Sexual Activity  . Alcohol use: No    Alcohol/week: 0.0 standard drinks    Comment: hx of EtOH abuse  . Drug use: No  . Sexual activity: Not on file  Other Topics Concern  . 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 No active exercise.    Social Determinants of Health   Financial Resource Strain: Not on file  Food Insecurity: Not on file  Transportation Needs: Not on file  Physical Activity: Not on file  Stress: Not on file  Social Connections: Not on file  Intimate Partner Violence: Not on file    Outpatient Medications Prior to Visit  Medication Sig Dispense Refill  . pantoprazole (PROTONIX) 40 MG tablet Take 1 tablet by mouth daily.    Marland Kitchen  SODIUM FLUORIDE 5000 PPM 1.1 % PSTE SMARTSIG:Sparingly Topical Every Night    . ARIPiprazole (ABILIFY) 5 MG tablet TAKE ONE TABLET (5 MG TOTAL) BY MOUTH DAILY 30 tablet 3  . atorvastatin (LIPITOR) 20 MG tablet Take 20 mg by mouth at bedtime.    Marland Kitchen azithromycin (ZITHROMAX Z-PAK) 250 MG tablet Take 2 tablets (500 mg) on  Day 1,  followed by 1 tablet (250 mg) once daily on Days 2 through 5. 6 tablet 0  . cilostazol (PLETAL) 100 MG tablet Take 100 mg by mouth 2 (two) times daily.    Marland Kitchen dicyclomine (BENTYL) 10 MG capsule Take 1 capsule (10 mg total) by mouth 3 (three) times daily before meals. 90 capsule 0  . DULoxetine (CYMBALTA) 30 MG capsule Take 30 mg by mouth 2 (two) times daily.     Marland Kitchen levothyroxine (SYNTHROID) 125 MCG tablet TAKE 1 TABLET BY MOUTH 6 DAYS A WEEK, AND 1/2 TABLET 1 DAY. 135 tablet 1  . lisinopril (ZESTRIL) 20 MG tablet Take 20 mg by mouth daily.    Marland Kitchen lubiprostone (AMITIZA) 8 MCG capsule Take 1 capsule (8 mcg total) by  mouth 2 (two) times daily with a meal. 60 capsule 3  . mirtazapine (REMERON SOL-TAB) 15 MG disintegrating tablet Take 15 mg by mouth daily.    . Multiple Vitamins-Minerals (MULTIVITAMIN MEN 50+) TABS Take 1 tablet by mouth daily.    Marland Kitchen NUVIGIL 250 MG tablet Take 250 mg by mouth daily.    . pentoxifylline (TRENTAL) 400 MG CR tablet TAKE 1 TABLET 3 TIMES A DAY WITH FOOD    . pregabalin (LYRICA) 200 MG capsule Take 1 capsule by mouth 3 (three) times daily.    . protriptyline (VIVACTIL) 10 MG tablet Take 10 mg by mouth 2 (two) times daily.    . predniSONE (DELTASONE) 50 MG tablet One tab PO daily for 5 days. 5 tablet 0   No facility-administered medications prior to visit.    No Active Allergies  ROS Review of Systems    Objective:    Physical Exam  BP 135/71   Pulse (!) 109   Ht 6' (1.829 m)   Wt 199 lb (90.3 kg)   SpO2 98%   BMI 26.99 kg/m  Wt Readings from Last 3 Encounters:  09/29/20 199 lb (90.3 kg)  08/21/20 209 lb (94.8 kg)  07/24/20 209 lb (94.8 kg)     There are no preventive care reminders to display for this patient.  There are no preventive care reminders to display for this patient.  Lab Results  Component Value Date   TSH 0.69 07/24/2020   Lab Results  Component Value Date   WBC 6.4 07/24/2020   HGB 11.9 (L) 07/24/2020   HCT 36.2 (L) 07/24/2020   MCV 87.7 07/24/2020   PLT 252 07/24/2020   Lab Results  Component Value Date   NA 141 09/05/2020   K 4.7 09/05/2020   CO2 28 09/05/2020   GLUCOSE 118 (H) 09/05/2020   BUN 9 09/05/2020   CREATININE 0.96 09/05/2020   BILITOT 0.2 03/05/2020   ALKPHOS 93 04/20/2016   AST 23 03/05/2020   ALT 21 03/05/2020   PROT 6.3 03/05/2020   ALBUMIN 4.2 04/20/2016   CALCIUM 9.5 09/05/2020   Lab Results  Component Value Date   CHOL 111 03/05/2020   Lab Results  Component Value Date   HDL 44 03/05/2020   Lab Results  Component Value Date   LDLCALC 50 03/05/2020   Lab  Results  Component Value Date   TRIG  91 03/05/2020   Lab Results  Component Value Date   CHOLHDL 2.5 03/05/2020   Lab Results  Component Value Date   HGBA1C 6.1 (A) 07/24/2020      Assessment & Plan:   Problem List Items Addressed This Visit      Cardiovascular and Mediastinum   PAD (peripheral artery disease) ([place])    Other Visit Diagnoses    Generalized abdominal pain    -  Primary   Relevant Orders   Ambulatory referral to Vascular Surgery   PVD (peripheral vascular disease) ([place])       Relevant Orders   Ambulatory referral to Vascular Surgery   Abnormal weight loss         Lower abdominal pain, bilateral-still unclear etiology if it is related to the constipation or may be separate issue so we did discuss increasing the Amitiza so that he takes to 24 mcg in the morning and 8 mcg in the evening to see if maybe this is a compromise did not cause diarrhea but to get his bowels moving a little bit more regularly.  Were also get a try some tramadol though I did warn that it can cause some mild constipation so we will have to keep an eye on that.  He also wonders if some component of this could be mesenteric ischemia.  We will go ahead and place referral to vascular as well.  He did have an aortic iliac duplex done at [facility] on March 9 as well as lower extremity venous duplex done on March 9.  Abnormal weight loss-he is lost another 10 pounds some of this is from eating less because it worsens his abdominal pain.  Which is still do not have a great reason for but I do think a vascular work-up is certainly warranted.  In the meantime working to try to improve the constipation by adjusting the Amitiza.  Pulmonary nodules-he does have his PET scan scheduled for Wednesday and as well as a follow-up with pulmonary on Thursday.  Meds ordered this encounter  Medications  . traMADol (ULTRAM) 50 MG tablet    Sig: Take 1 tablet (50 mg total) by mouth every 8 (eight) hours as needed for up to 5 days.    Dispense:  15 tablet     Refill:  0    Follow-up: Return if symptoms worsen or fail to improve.    Beatrice Lecher, MD

## 2020-10-01 ENCOUNTER — Ambulatory Visit (HOSPITAL_COMMUNITY)
Admission: RE | Admit: 2020-10-01 | Discharge: 2020-10-01 | Disposition: A | Discharge status: Home / Self Care | Payer: BC Managed Care – PPO | Source: Ambulatory Visit | Attending: Family Medicine | Admitting: Family Medicine

## 2020-10-01 ENCOUNTER — Other Ambulatory Visit: Payer: Self-pay

## 2020-10-01 DIAGNOSIS — R918 Other nonspecific abnormal finding of lung field: Secondary | ICD-10-CM | POA: Diagnosis present

## 2020-10-01 LAB — GLUCOSE, CAPILLARY: Glucose-Capillary: 175 mg/dL — ABNORMAL HIGH (ref 70–99)

## 2020-10-01 MED ORDER — FLUDEOXYGLUCOSE F - 18 (FDG) INJECTION
9.7400 | Freq: Once | INTRAVENOUS | Status: AC | PRN
  Administered 2020-10-01: 9.74 via INTRAVENOUS

## 2020-10-02 ENCOUNTER — Ambulatory Visit (INDEPENDENT_AMBULATORY_CARE_PROVIDER_SITE_OTHER): Payer: BC Managed Care – PPO | Admitting: Emergency Medicine

## 2020-10-02 ENCOUNTER — Encounter: Payer: Self-pay | Admitting: Emergency Medicine

## 2020-10-02 ENCOUNTER — Other Ambulatory Visit: Payer: Self-pay | Admitting: Family Medicine

## 2020-10-02 ENCOUNTER — Telehealth: Payer: Self-pay | Admitting: Gastroenterology

## 2020-10-02 DIAGNOSIS — R911 Solitary pulmonary nodule: Secondary | ICD-10-CM | POA: Diagnosis not present

## 2020-10-02 NOTE — Assessment & Plan Note (Signed)
Reviewed his scans today including his PET scan.  Fortunately his pulmonary nodules have characteristics consistent with metastatic disease.  The PET scan has identified a mass at the GE junction as well as a hepatic lesion, paratracheal and para-aortic nodes.  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  Discussed with Dr. Silverio Decamp who recommends endoscopy as for strategy for tissue diagnosis.  They will call Eddie Hernandez to try to get this set up ASAP.

## 2020-10-02 NOTE — Telephone Encounter (Signed)
Patient will come in today for pre-visit.  He desires to come in and not do virtual.  EGD scheduled for 10/07/20 with Dr. Lyndel Safe.  Patient is aware of appointments

## 2020-10-02 NOTE — Patient Instructions (Signed)
We reviewed your CT scans, PET scan today. Dr. Lamonte Sakai reviewed your scans with Gastroenterology.  You will be contacted by their office to set up endoscopy to evaluate the abnormality in your esophagus and stomach.  The phone number for [facility] is [phone] in case you need to contact them.  We will update Dr Madilyn Fireman as well.

## 2020-10-02 NOTE — Telephone Encounter (Signed)
Doc of the day Note  Dr. Lamonte Sakai from the [facility] pulmonary called to discuss this patient.  He was referred to him for pulmonary nodules but on review of PET/CT he has hypermetabolic mass lesion in GE junction extending into gastric cardia. Patient will need EGD and biopsies for further evaluation.  We will try to schedule patient for direct EGD in [place] with RN previsit to help get the procedure done timely and avoid further delay  Patient is on Pletal, will try to obtain clearance from prescribing provider to hold prior to the procedure but if patient needs to stay on it, fine to proceed with EGD on Pletal  I will cc Dr. Lyndel Safe who will be doing the procedure, he has the first available date for procedure in [place] , [place] [phone]

## 2020-10-02 NOTE — Telephone Encounter (Signed)
Got it Thanks for letting me know RG

## 2020-10-02 NOTE — Telephone Encounter (Signed)
Discussed with Dr. Silverio Decamp.  Patient was started on Pletal on 09/09/20 for iliac stents and is being worked up for possible mesenteric ischemia.  Per Dr. Silverio Decamp, proceed with EGD on Pletal.

## 2020-10-02 NOTE — Progress Notes (Signed)
Subjective:    Patient ID: Eddie Hernandez, male    DOB: May 10, 1956, 65 y.o.   MRN: [id number]  HPI 65 year old man, former smoker (45 pack years), history of peripheral vascular disease, alcohol use, bipolar, hypothyroidism, small fiber neuropathy. He has been having chronic constipation which prompted series of imaging studies as below.   He participates in lung cancer screening program, last was on 11/05/2020 that unified multiple stable scattered pulmonary nodules bilaterally that were unchanged compared with prior, the largest was 8.2 mm at the left apex.  Underwent a CT chest and abdomen 4/11 and 4/13, reviewed, show an apparent area of shouldering at the duodenum, question peristalsis, no obvious mass.  There is an interval development of multiple bilateral pulmonary nodules that are new compared with the screening CT 11/06/2019  PET scan done 10/01/2020 reviewed by me shows intense circumferential hypermetabolic activity at the GE junction and gastric cardia (SUV max 10.2) with hypermetabolic activity 3 x 4 cm in the proximal stomach, no gastrohepatic ligament nodes, evidence of metastatic lymphadenopathy involving a high right paratracheal node and periaortic node at the bifurcation.  Multiple small bilateral pulmonary nodules some of which have mild hypermetabolic activity.  No hypermetabolic skeletal disease.   Review of Systems As per HPI  Past Medical History:  Diagnosis Date  . Alcohol abuse   . Bipolar 1 disorder ([place])   . Hypothyroidism   . PAD (peripheral artery disease) ([place])   . Peripheral neuropathy    small fiber  . Prostate pain   . Tremor      Family History  Problem Relation Age of Onset  . Stroke Father   . Alcoholism Father   . Cancer Sister        Lung      Social History   Socioeconomic History  . Marital status: Married    Spouse name: Maudry Mayhew   . Number of children: Not on file  . Years of education: Not on file  . Highest education level: Not on file   Occupational History  . Occupation: works in Scientist, research (medical).      Comment: [name]  Tobacco Use  . Smoking status: Former Smoker    Packs/day: 1.00    Years: 45.00    Pack years: 45.00    Types: Cigarettes    Quit date: 09/08/2017    Years since quitting: 3.0  . Smokeless tobacco: Never Used  . Tobacco comment: Pt does vape currently   Substance and Sexual Activity  . Alcohol use: No    Alcohol/week: 0.0 standard drinks    Comment: hx of EtOH abuse  . Drug use: No  . Sexual activity: Not on file  Other Topics Concern  . 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 No active exercise.    Social Determinants of Health   Financial Resource Strain: Not on file  Food Insecurity: Not on file  Transportation Needs: Not on file  Physical Activity: Not on file  Stress: Not on file  Social Connections: Not on file  Intimate Partner Violence: Not on file    Has worked as a Dealer, Chief Financial Officer Has lived in [place], [place] No [place] aquariums >> fumes and wood dust.    No Active Allergies   Outpatient Medications Prior to Visit  Medication Sig Dispense Refill  . ARIPiprazole (ABILIFY) 5 MG tablet TAKE ONE TABLET (5 MG TOTAL) BY MOUTH DAILY 30 tablet 3  .  atorvastatin (LIPITOR) 20 MG tablet Take 20 mg by mouth at bedtime.    Marland Kitchen azithromycin (ZITHROMAX Z-PAK) 250 MG tablet Take 2 tablets (500 mg) on  Day 1,  followed by 1 tablet (250 mg) once daily on Days 2 through 5. 6 tablet 0  . cilostazol (PLETAL) 100 MG tablet Take 100 mg by mouth 2 (two) times daily.    Marland Kitchen dicyclomine (BENTYL) 10 MG capsule Take 1 capsule (10 mg total) by mouth 3 (three) times daily before meals. 90 capsule 0  . DULoxetine (CYMBALTA) 30 MG capsule Take 30 mg by mouth 2 (two) times daily.     Marland Kitchen levothyroxine (SYNTHROID) 125 MCG tablet TAKE 1 TABLET BY MOUTH 6 DAYS A WEEK, AND 1/2 TABLET 1 DAY. 135 tablet 1  . lisinopril (ZESTRIL) 20 MG tablet Take 20 mg by mouth daily.     Marland Kitchen lubiprostone (AMITIZA) 8 MCG capsule Take 1 capsule (8 mcg total) by mouth 2 (two) times daily with a meal. 60 capsule 3  . mirtazapine (REMERON SOL-TAB) 15 MG disintegrating tablet Take 15 mg by mouth daily.    . Multiple Vitamins-Minerals (MULTIVITAMIN MEN 50+) TABS Take 1 tablet by mouth daily.    Marland Kitchen NUVIGIL 250 MG tablet Take 250 mg by mouth daily.    . pantoprazole (PROTONIX) 40 MG tablet Take 1 tablet by mouth daily.    . pentoxifylline (TRENTAL) 400 MG CR tablet TAKE 1 TABLET 3 TIMES A DAY WITH FOOD    . pregabalin (LYRICA) 200 MG capsule Take 1 capsule by mouth 3 (three) times daily.    . protriptyline (VIVACTIL) 10 MG tablet Take 10 mg by mouth 2 (two) times daily.    . SODIUM FLUORIDE 5000 PPM 1.1 % PSTE SMARTSIG:Sparingly Topical Every Night    . traMADol (ULTRAM) 50 MG tablet Take 1 tablet (50 mg total) by mouth every 8 (eight) hours as needed for up to 5 days. 15 tablet 0   No facility-administered medications prior to visit.         Objective:   Physical Exam  Vitals:   10/02/20 0940  BP: 134/82  Pulse: (!) 102  Temp: 97.8 F (36.6 C)  TempSrc: Temporal  SpO2: 96%  Weight: 200 lb 9.6 oz (91 kg)  Height: 6' (1.829 m)   Gen: Pleasant, well-nourished, in no distress,  normal affect  ENT: No lesions,  mouth clear,  oropharynx clear, no postnasal drip  Neck: No JVD, no stridor  Lungs: No use of accessory muscles, no crackles or wheezing on normal respiration, no wheeze on forced expiration  Cardiovascular: RRR, heart sounds normal, no murmur or gallops, no peripheral edema  Musculoskeletal: No deformities, no cyanosis or clubbing  Neuro: alert, awake, non focal  Skin: Warm, no lesions or rash     Assessment & Plan:  Pulmonary nodule Reviewed his scans today including his PET scan.  Fortunately his pulmonary nodules have characteristics consistent with metastatic disease.  The PET scan has identified a mass at the GE junction as well as a hepatic lesion,  paratracheal and para-aortic nodes.  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  Discussed with Dr. Silverio Decamp who recommends endoscopy as for strategy for tissue diagnosis.  They will call Eddie Hernandez to try to get this set up ASAP.  Baltazar Apo, MD, PhD 10/02/2020, 10:24 AM [facility] [phone] or if no answer before 7:00PM call [phone] For any issues after 7:00PM please call eLink [phone]

## 2020-10-03 ENCOUNTER — Ambulatory Visit (AMBULATORY_SURGERY_CENTER): Payer: BC Managed Care – PPO | Admitting: *Deleted

## 2020-10-03 ENCOUNTER — Other Ambulatory Visit: Payer: Self-pay

## 2020-10-03 VITALS — Ht 72.0 in | Wt 195.0 lb

## 2020-10-03 DIAGNOSIS — C160 Malignant neoplasm of cardia: Secondary | ICD-10-CM

## 2020-10-03 NOTE — Progress Notes (Signed)
Pt verified name, DOB, address and insurance during PV today.  . PV completed over the phone. Pt encouraged to call with questions or issues. My Chart instructions to pt - his procedure is Tuesday- no time to mail packet   No egg or soy allergy known to patient  No issues with past sedation with any surgeries or procedures Patient denies ever being told they had issues or difficulty with intubation  No FH of Malignant Hyperthermia No diet pills per patient No home 02 use per patient   On  blood thinners per patient - pt on Pletal and is to stay on for EGD  per KN - see TE   Pt denies issues with constipation  No A fib or A flutter  EMMI video to pt or via [place] 19 guidelines implemented in [place] today with Pt and RN  Pt is fully vaccinated  for Covid   Due to the COVID-19 pandemic we are asking patients to follow certain guidelines.  Pt aware of COVID protocols and [facility] guidelines

## 2020-10-06 ENCOUNTER — Encounter: Payer: Self-pay | Admitting: Gastroenterology

## 2020-10-07 ENCOUNTER — Other Ambulatory Visit: Payer: Self-pay

## 2020-10-07 ENCOUNTER — Ambulatory Visit (AMBULATORY_SURGERY_CENTER): Payer: BC Managed Care – PPO | Admitting: Gastroenterology

## 2020-10-07 ENCOUNTER — Encounter: Payer: Self-pay | Admitting: Gastroenterology

## 2020-10-07 ENCOUNTER — Telehealth: Payer: Self-pay | Admitting: *Deleted

## 2020-10-07 VITALS — BP 117/70 | HR 104 | Temp 97.3°F | Resp 15 | Ht 72.0 in | Wt 195.0 lb

## 2020-10-07 DIAGNOSIS — C7802 Secondary malignant neoplasm of left lung: Secondary | ICD-10-CM

## 2020-10-07 DIAGNOSIS — C160 Malignant neoplasm of cardia: Secondary | ICD-10-CM

## 2020-10-07 DIAGNOSIS — C7801 Secondary malignant neoplasm of right lung: Secondary | ICD-10-CM

## 2020-10-07 MED ORDER — SODIUM CHLORIDE 0.9 % IV SOLN: 500.0000 mL | Freq: Once | INTRAVENOUS | Status: DC

## 2020-10-07 NOTE — Progress Notes (Signed)
Report given to PACU, vss 

## 2020-10-07 NOTE — Progress Notes (Signed)
VS by CW  I have reviewed the patient's medical history in detail and updated the computerized patient record.  

## 2020-10-07 NOTE — Patient Instructions (Addendum)
YOU HAD AN ENDOSCOPIC PROCEDURE TODAY AT THE [facility]:   Refer to the procedure report that was given to you for any specific questions about what was found during the examination.  If the procedure report does not answer your questions, please call your gastroenterologist to clarify.  If you requested that your care partner not be given the details of your procedure findings, then the procedure report has been included in a sealed envelope for you to review at your convenience later.  YOU SHOULD EXPECT: Some feelings of bloating in the abdomen. Passage of more gas than usual.  Walking can help get rid of the air that was put into your GI tract during the procedure and reduce the bloating. If you had a lower endoscopy (such as a colonoscopy or flexible sigmoidoscopy) you may notice spotting of blood in your stool or on the toilet paper. If you underwent a bowel prep for your procedure, you may not have a normal bowel movement for a few days.  Please Note:  You might notice some irritation and congestion in your nose or some drainage.  This is from the oxygen used during your procedure.  There is no need for concern and it should clear up in a day or so.  SYMPTOMS TO REPORT IMMEDIATELY:   Following upper endoscopy (EGD)  Vomiting of blood or coffee ground material  New chest pain or pain under the shoulder blades  Painful or persistently difficult swallowing  New shortness of breath  Fever of 100F or higher  Black, tarry-looking stools  For urgent or emergent issues, a gastroenterologist can be reached at any hour by calling [phone]. Do not use MyChart messaging for urgent concerns.    DIET:  We do recommend a small meal at first, but then you may proceed to your regular diet.  Drink plenty of fluids but you should avoid alcoholic beverages for 24 hours.  ACTIVITY:  You should plan to take it easy for the rest of today and you should NOT DRIVE or use heavy machinery  until tomorrow (because of the sedation medicines used during the test).    FOLLOW UP: Our staff will call the number listed on your records 48-72 hours following your procedure to check on you and address any questions or concerns that you may have regarding the information given to you following your procedure. If we do not reach you, we will leave a message.  We will attempt to reach you two times.  During this call, we will ask if you have developed any symptoms of COVID 19. If you develop any symptoms (ie: fever, flu-like symptoms, shortness of breath, cough etc.) before then, please call [phone].  If you test positive for Covid 19 in the 2 weeks post procedure, please call and report this information to us.    If any biopsies were taken you will be contacted by phone or by letter within the next 1-3 weeks.  Please call us at [phone] if you have not heard about the biopsies in 3 weeks.    SIGNATURES/CONFIDENTIALITY: You and/or your care partner have signed paperwork which will be entered into your electronic medical record.  These signatures attest to the fact that that the information above on your After Visit Summary has been reviewed and is understood.  Full responsibility of the confidentiality of this discharge information lies with you and/or your care-partner.    Resume medications. 

## 2020-10-07 NOTE — Progress Notes (Signed)
1145 Robinul 0.1 mg IV given due large amount of secretions upon assessment.  MD made aware, vss 

## 2020-10-07 NOTE — Progress Notes (Signed)
1158 HR > 100 with esmolol 25 mg given IV, MD updated, vss

## 2020-10-07 NOTE — Op Note (Signed)
[facility] Patient Name: Eddie Hernandez Procedure Date: 10/07/2020 11:44 AM MRN: [id number] Endoscopist: Jackquline Denmark , MD Age: 65 Referring MD:  Date of Birth: 02-Aug-1955 Gender: Male Account #: [id number] Procedure:                Upper GI endoscopy Indications:              Abnormal PET scan of the GI tract Medicines:                Monitored Anesthesia Care Procedure:                Pre-Anesthesia Assessment:                           - Prior to the procedure, a History and Physical                            was performed, and patient medications and                            allergies were reviewed. The patient's tolerance of                            previous anesthesia was also reviewed. The risks                            and benefits of the procedure and the sedation                            options and risks were discussed with the patient.                            All questions were answered, and informed consent                            was obtained. Prior Anticoagulants: The patient has                            taken Pletal (cilostazol), last dose was day of                            procedure. ASA Grade Assessment: III - A patient                            with severe systemic disease. After reviewing the                            risks and benefits, the patient was deemed in                            satisfactory condition to undergo the procedure.                           After obtaining informed consent, the endoscope was  passed under direct vision. Throughout the                            procedure, the patient's blood pressure, pulse, and                            oxygen saturations were monitored continuously. The                            Endoscope was introduced through the mouth, and                            advanced to the second part of duodenum. The upper                            GI endoscopy was  accomplished without difficulty.                            The patient tolerated the procedure well. Scope In: Scope Out: Findings:                 A 2 cm fungating, ulcerating mass was found in the                            distal esophagus, 42-44 cm from the incisors,                            involving GE junction. The mass was non-obstructing                            and partially circumferential (involving one-half                            of the lumen circumference). This extended into the                            gastric cardia (3 cm) with deep central ulceration                            and heaped up margins. No suggestion of underlying                            Barrett's esophagus. Biopsies were taken with a                            cold forceps for histology.                           The exam of the stomach was otherwise normal.                           The examined duodenum was normal. Complications:            No immediate complications. Estimated Blood Loss:     Estimated blood  loss: none. 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                            Biopsied. Recommendation:           - Patient has a contact number available for                            emergencies. The signs and symptoms of potential                            delayed complications were discussed with the                            patient. Return to normal activities tomorrow.                            Written discharge instructions were provided to the                            patient.                           - Resume previous diet.                           - Continue present medications.                           - Resume Pletal (cilostazol) at prior dose tomorrow.                           - Await pathology results. Sent [facility].                           - The findings and  recommendations were discussed                            with the patient's family. Jackquline Denmark, MD 10/07/2020 12:16:40 PM This report has been signed electronically.

## 2020-10-07 NOTE — Telephone Encounter (Signed)
Spoke with pt and he wanted to know about getting another appt with Vascular that is closer than [place].   Pt stated that his results came back today from the GI doctor. And it showed that he has an ulcer.   Will fwd to Dr. Madilyn Fireman for advice about this.

## 2020-10-08 NOTE — Telephone Encounter (Signed)
I sent to [facility] in [place] they will call him to schedule - CF

## 2020-10-09 ENCOUNTER — Encounter: Payer: Self-pay | Admitting: *Deleted

## 2020-10-09 ENCOUNTER — Other Ambulatory Visit: Payer: Self-pay

## 2020-10-09 ENCOUNTER — Telehealth: Payer: Self-pay | Admitting: *Deleted

## 2020-10-09 ENCOUNTER — Encounter (INDEPENDENT_AMBULATORY_CARE_PROVIDER_SITE_OTHER): Payer: Self-pay | Admitting: Vascular Surgery

## 2020-10-09 ENCOUNTER — Ambulatory Visit (INDEPENDENT_AMBULATORY_CARE_PROVIDER_SITE_OTHER): Payer: BC Managed Care – PPO | Admitting: Vascular Surgery

## 2020-10-09 VITALS — BP 103/63 | HR 116 | Ht 72.0 in | Wt 202.0 lb

## 2020-10-09 DIAGNOSIS — I10 Essential (primary) hypertension: Secondary | ICD-10-CM | POA: Diagnosis not present

## 2020-10-09 DIAGNOSIS — E782 Mixed hyperlipidemia: Secondary | ICD-10-CM

## 2020-10-09 DIAGNOSIS — I2510 Atherosclerotic heart disease of native coronary artery without angina pectoris: Secondary | ICD-10-CM

## 2020-10-09 DIAGNOSIS — J432 Centrilobular emphysema: Secondary | ICD-10-CM

## 2020-10-09 DIAGNOSIS — I70213 Atherosclerosis of native arteries of extremities with intermittent claudication, bilateral legs: Secondary | ICD-10-CM

## 2020-10-09 DIAGNOSIS — C160 Malignant neoplasm of cardia: Secondary | ICD-10-CM

## 2020-10-09 DIAGNOSIS — C169 Malignant neoplasm of stomach, unspecified: Secondary | ICD-10-CM | POA: Diagnosis not present

## 2020-10-09 DIAGNOSIS — I70219 Atherosclerosis of native arteries of extremities with intermittent claudication, unspecified extremity: Secondary | ICD-10-CM | POA: Insufficient documentation

## 2020-10-09 NOTE — Progress Notes (Signed)
MRN : [id number]  Eddie Hernandez is a 65 y.o. (Dec 12, 1955) male who presents with chief complaint of  Chief Complaint  Patient presents with  . New Patient (Initial Visit)    [place] PVD  . PVD    [place] PVD   .  History of Present Illness:   The patient is seen for evaluation of painful lower extremities. Patient notes the pain is variable and not always associated with activity, eg bending over elicits the pain.  The pain is somewhat consistent day to day occurring on most days. The patient notes the pain also occurs with standing and routinely seems worse as the day wears on. The pain has been progressive over the past several years. The patient states these symptoms are causing  a profound negative impact on quality of life and daily activities.  He is s/p iliac stents 2 years ago and follow up surveillance has demonstrated >50% restenosis   The patient denies rest pain or dangling of an extremity off the side of the bed during the night for relief. No open wounds or sores at this time. No history of DVT or phlebitis. No prior interventions or surgeries.  There is no  history of lower back problems or DJD of the lumbar and sacral spine.   Today he was informed he has gastric CA with pulmonary nodules.  CT scan of the abdomen done 09/08/2020 is reviewed by me personally with the patient and his wife.  It shows the SMA and the celiac is widely patent and the iliac stents are consistent with restenosis of at least 50%  Current Meds  Medication Sig  . ARIPiprazole (ABILIFY) 5 MG tablet TAKE ONE TABLET (5 MG TOTAL) BY MOUTH DAILY  . atorvastatin (LIPITOR) 20 MG tablet Take 40 mg by mouth at bedtime.  . cilostazol (PLETAL) 100 MG tablet Take 100 mg by mouth 2 (two) times daily.  Marland Kitchen dicyclomine (BENTYL) 10 MG capsule Take 1 capsule (10 mg total) by mouth 3 (three) times daily before meals.  . DULoxetine (CYMBALTA) 30 MG capsule Take 30 mg by mouth 2 (two) times daily.   Marland Kitchen  levothyroxine (SYNTHROID) 125 MCG tablet TAKE 1 TABLET BY MOUTH 6 DAYS A WEEK, AND 1/2 TABLET 1 DAY.  Marland Kitchen lisinopril (ZESTRIL) 20 MG tablet Take 20 mg by mouth daily.  Marland Kitchen lubiprostone (AMITIZA) 8 MCG capsule Take 1 capsule (8 mcg total) by mouth 2 (two) times daily with a meal. (Patient taking differently: Take 8 mcg by mouth 2 (two) times daily with a meal. 24 once, 8 bid)  . mirtazapine (REMERON SOL-TAB) 15 MG disintegrating tablet Take 15 mg by mouth daily.  . Multiple Vitamins-Minerals (MULTIVITAMIN MEN 50+) TABS Take 1 tablet by mouth daily.  Marland Kitchen NUVIGIL 250 MG tablet Take 250 mg by mouth daily.  . pantoprazole (PROTONIX) 40 MG tablet Take 1 tablet by mouth daily.  . pentoxifylline (TRENTAL) 400 MG CR tablet   . pregabalin (LYRICA) 200 MG capsule Take 1 capsule by mouth 3 (three) times daily.  . protriptyline (VIVACTIL) 10 MG tablet Take 10 mg by mouth 2 (two) times daily.  . SODIUM FLUORIDE 5000 PPM 1.1 % PSTE SMARTSIG:Sparingly Topical Every Night  . traMADol (ULTRAM) 50 MG tablet Take 1 tablet (50 mg total) by mouth every 8 (eight) hours as needed.    Past Medical History:  Diagnosis Date  . Alcohol abuse   . Bipolar 1 disorder ([place])   . Constipation   . Emphysema  of lung ([place])   . GERD (gastroesophageal reflux disease)   . Hyperlipidemia   . Hypertension   . Hypothyroidism   . PAD (peripheral artery disease) ([place])   . Peripheral neuropathy    small fiber  . Prostate pain   . Stroke [facility])    Per CT scan - Old - pt was not aware   . Tremor     Past Surgical History:  Procedure Laterality Date  . brain stimulator removed    . COLONOSCOPY    . DEEP BRAIN STIMULATOR PLACEMENT  01-30-08   tremors  . ILIAC ARTERY STENT     x2    Social History Social History   Tobacco Use  . Smoking status: Former Smoker    Packs/day: 1.00    Years: 45.00    Pack years: 45.00    Types: Cigarettes    Quit date: 09/08/2017    Years since quitting: 3.0  . Smokeless tobacco: Never Used   . Tobacco comment: Pt does vape currently   Vaping Use  . Vaping Use: Every day  . Substances: Nicotine  Substance Use Topics  . Alcohol use: No    Alcohol/week: 0.0 standard drinks    Comment: hx of EtOH abuse  . Drug use: No    Family History Family History  Problem Relation Age of Onset  . Stroke Father   . Alcoholism Father   . Cancer Sister        Lung   . Colon cancer Neg Hx   . Colon polyps Neg Hx   . Esophageal cancer Neg Hx   . Rectal cancer Neg Hx   . Stomach cancer Neg Hx   No family history of bleeding/clotting disorders, porphyria or autoimmune disease   No Known Allergies   REVIEW OF SYSTEMS (Negative unless checked)  Constitutional: [] Weight loss  [] Fever  [] Chills Cardiac: [] Chest pain   [] Chest pressure   [] Palpitations   [] Shortness of breath when laying flat   [x] Shortness of breath with exertion. Vascular:  [x] Pain in legs with walking   [x] Pain in legs at rest  [] History of DVT   [] Phlebitis   [] Swelling in legs   [] Varicose veins   [] Non-healing ulcers Pulmonary:   [] Uses home oxygen   [] Productive cough   [] Hemoptysis   [] Wheeze  [x] COPD   [] Asthma Neurologic:  [] Dizziness   [] Seizures   [] History of stroke   [] History of TIA  [] Aphasia   [] Vissual changes   [] Weakness or numbness in arm   [x] Weakness or numbness in leg Musculoskeletal:   [] Joint swelling   [x] Joint pain   [] Low back pain Hematologic:  [] Easy bruising  [] Easy bleeding   [] Hypercoagulable state   [] Anemic Gastrointestinal:  [] Diarrhea   [] Vomiting  [x] Gastroesophageal reflux/heartburn   [] Difficulty swallowing. Genitourinary:  [] Chronic kidney disease   [] Difficult urination  [] Frequent urination   [] Blood in urine Skin:  [] Rashes   [] Ulcers  Psychological:  [] History of anxiety   []  History of major depression.  Physical Examination  Vitals:   10/09/20 1431  BP: 103/63  Pulse: (!) 116  Weight: 202 lb (91.6 kg)  Height: 6' (1.829 m)   Body mass index is 27.4 kg/m. Gen:  WD/WN, NAD Head: [place]/AT, No temporalis wasting.  Ear/Nose/Throat: Hearing grossly intact, nares w/o erythema or drainage, poor dentition Eyes: PER, EOMI, sclera nonicteric.  Neck: Supple, no masses.  No bruit or JVD.  Pulmonary:  Good air movement, clear to auscultation bilaterally, no use of accessory  muscles.  Cardiac: RRR, normal S1, S2, no Murmurs. Vascular:  Vessel Right Left  Radial Palpable Palpable  Gastrointestinal: soft, non-distended. No guarding/no peritoneal signs.  Musculoskeletal: M/S 5/5 throughout.  No deformity or atrophy.  Neurologic: CN 2-12 intact. Pain and light touch intact in extremities.  Symmetrical.  Speech is fluent. Motor exam as listed above. Psychiatric: Judgment intact, Mood & affect appropriate for pt's clinical situation. Dermatologic: No rashes or ulcers noted.  No changes consistent with cellulitis. Lymph : No Cervical lymphadenopathy, no lichenification or skin changes of chronic lymphedema.  CBC Lab Results  Component Value Date   WBC 6.4 07/24/2020   HGB 11.9 (L) 07/24/2020   HCT 36.2 (L) 07/24/2020   MCV 87.7 07/24/2020   PLT 252 07/24/2020    BMET    Component Value Date/Time   NA 141 09/05/2020 0000   K 4.7 09/05/2020 0000   CL 105 09/05/2020 0000   CO2 28 09/05/2020 0000   GLUCOSE 118 (H) 09/05/2020 0000   BUN 9 09/05/2020 0000   CREATININE 0.96 09/05/2020 0000   CALCIUM 9.5 09/05/2020 0000   GFRNONAA 93 07/24/2020 1010   GFRAA 108 07/24/2020 1010   CrCl cannot be calculated (Patient's most recent lab result is older than the maximum 21 days allowed.).  COAG No results found for: INR, PROTIME  Radiology CT Chest W Contrast  Result Date: 09/11/2020 CLINICAL DATA:  Pulmonary nodules, history of abdominal pain and constipation EXAM: CT CHEST WITH CONTRAST TECHNIQUE: Multidetector CT imaging of the chest was performed during intravenous contrast administration. CONTRAST:  12mL OMNIPAQUE IOHEXOL 300 MG/ML  SOLN COMPARISON:   11/06/2019 FINDINGS: Cardiovascular: The heart and great vessels are unremarkable without pericardial effusion. No evidence of thoracic aortic aneurysm or dissection. Mild atherosclerosis of the aorta and coronary vasculature. Mediastinum/Nodes: There is an enlarged right paratracheal lymph node measuring 11 mm in short axis reference image 18 series 2. No other pathologic adenopathy. Thyroid, trachea, and esophagus are unremarkable. Lungs/Pleura: There are multiple solid bilateral pulmonary nodules, greatest in the lung bases. Several nodules demonstrate spiculated margins. There are greater than 20 total nodules. Index nodules are as follows: Left lower lobe, image 135/3, 11 x 9 x 9 mm. Right lower lobe, image 122/3, 9 x 6 x 6 mm. No effusion or pneumothorax.  Central airways are patent. Upper Abdomen: Mild retroperitoneal fat stranding, please refer to recent CT abdomen report dated 09/08/2020. Musculoskeletal: No acute or destructive bony lesions. Reconstructed images demonstrate no additional findings. IMPRESSION: 1. Enlarged right paratracheal lymph node, nonspecific. Metastatic disease cannot be excluded. PET-CT may be useful for further evaluation. 2. Numerous bilateral pulmonary nodules, largest in the left lower lobe with a mean diameter of approximately 10 mm. Given the spiculated margins of several nodules, neoplasm is suspected. PET-CT recommended for further evaluation. 3. Stable retroperitoneal fat stranding within the upper abdomen, please refer to recent abdominal CT dated 09/08/2020. These results will be called to the ordering clinician or representative by the Radiologist Assistant, and communication documented in the PACS or [employer]. Electronically Signed   By: Randa Ngo M.D.   On: 09/11/2020 23:24   NM PET Image Initial (PI) Skull Base To Thigh  Result Date: 10/01/2020 CLINICAL DATA:  Initial treatment strategy for pulmonary nodu nodules le. EXAM: NUCLEAR MEDICINE PET SKULL  BASE TO THIGH TECHNIQUE: 9.74 mCi F-18 FDG was injected intravenously. Full-ring PET imaging was performed from the skull base to thigh after the radiotracer. CT data was obtained and used for  attenuation correction and anatomic localization. Fasting blood glucose: 170 mg/dl COMPARISON:  CT 09/10/2020 FINDINGS: Mediastinal blood pool activity: SUV max 2.5 Liver activity: SUV max 5 NECK: No hypermetabolic lymph nodes in the neck. Incidental CT findings: none CHEST: Bilateral small pulmonary nodules again demonstrated. Some larger nodules have radiotracer activity. Example nodule in the LEFT lower lobe with SUV max equal 2.2. Example RIGHT lower lobe nodule on image [id number]) Hypermetabolic high RIGHT paratracheal node measures 13 mm with SUV max equal 5.7 Incidental CT findings: none ABDOMEN/PELVIS: Intense circumferential hypermetabolic activity through the GE junction and gastric cardia with SUV max equal 10.2. Hypermetabolic activity involves approximately 3 cm x 4 cm region of the proximal stomach. No hypermetabolic gastrohepatic ligament nodes. Hypermetabolic focus within the RIGHT hepatic lobe with SUV max equal 6.1 corresponds to a subtle low-density region on CT measuring 12 mm image 107/4). Hypermetabolic lymph node deep to the IVC measuring 9 mm with SUV max equal 3.8. Node is several cm above the bifurcation. No pelvic lymphadenopathy. Incidental CT findings: Bi-iliac stents noted. SKELETON: No focal hypermetabolic activity to suggest skeletal metastasis. Incidental CT findings: none IMPRESSION: 1. Intense circumferential hypermetabolic activity through the GE junction and gastric cardiac most consistent with primary gastroesophageal carcinoma. 2. Evidence of metastatic lymphadenopathy involving a high RIGHT paratracheal lymph node as well as a periaortic lymph node above the aortic bifurcation. 3. Multiple bilateral small pulmonary nodules some of which have associated mild metabolic activity consistent with  pulmonary metastasis. 4. Single focal hypermetabolic lesion within the RIGHT hepatic lobe consistent with hepatic metastasis. 5. No skeletal metastasis. These results will be called to the ordering clinician or representative by the Radiologist Assistant, and communication documented in the PACS or [employer]. Electronically Signed   By: Suzy Bouchard M.D.   On: 10/01/2020 16:36     Assessment/Plan 1. Atherosclerosis of native artery of both lower extremities with intermittent claudication (HCC) Recommend:  The patient has experienced increased symptoms and is now describing lifestyle limiting claudication and mild rest pain.  Given the severity of the patient's lower extremity symptoms the patient should undergo angiography and intervention of the iliac arteries.  Risk and benefits were reviewed the patient.  Indications for the procedure were reviewed.  All questions were answered, the patient will consider this.   The patient should continue walking and begin a more formal exercise program.  The patient should continue antiplatelet therapy and aggressive treatment of the lipid abnormalities  The patient will follow up with me after his visit with the oncologist  2. 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 Coronary artery disease involving native coronary artery of native heart without angina pectoris Continue cardiac and antihypertensive medications as already ordered and reviewed, no changes at this time.  Continue statin as ordered and reviewed, no changes at this time  Nitrates PRN for chest pain   4. Essential hypertension Continue antihypertensive medications as already ordered, these medications have been reviewed and there are no changes at this time.   5. Centrilobular emphysema ([place]) Continue pulmonary medications and aerosols as already ordered, these medications have been reviewed and there are no changes at this  time.    6. Mixed hyperlipidemia Continue statin as ordered and reviewed, no changes at this time     Hortencia Pilar, MD  10/09/2020 8:18 PM

## 2020-10-09 NOTE — Telephone Encounter (Signed)
Oncology urgent referral ordered.

## 2020-10-09 NOTE — Telephone Encounter (Signed)
1. Have you developed a fever since your procedure? no  2.   Have you had an respiratory symptoms (SOB or cough) since your procedure? no  3.   Have you tested positive for COVID 19 since your procedure no  4.   Have you had any family members/close contacts diagnosed with the COVID 19 since your procedure?  no   If yes to any of these questions please route to Joylene John, RN and Joella Prince, RN Follow up Call-  Call back number 10/07/2020  Post procedure Call Back phone  # [phone]  Permission to leave phone message Yes  Some recent data might be hidden     Patient questions:  Do you have a fever, pain , or abdominal swelling? No. Pain Score  0 *  Have you tolerated food without any problems? Yes.    Have you been able to return to your normal activities? Yes.    Do you have any questions about your discharge instructions: Diet   No. Medications  No. Follow up visit  No.  Do you have questions or concerns about your Care? No.  Actions: * If pain score is 4 or above: No action needed, pain <4.

## 2020-10-09 NOTE — Progress Notes (Signed)
Reached out to Eddie Hernandez to introduce myself as the office RN Navigator and explain our new patient process. Reviewed the reason for their referral and scheduled their new patient appointment along with labs. Provided address and directions to the office including call back phone number. Reviewed with patient any concerns they may have or any possible barriers to attending their appointment.   Oncology Nurse Navigator Documentation  Oncology Nurse Navigator Flowsheets 10/09/2020  Abnormal Finding Date 10/01/2020  Confirmed Diagnosis Date 10/07/2020  Diagnosis Status Confirmed Diagnosis Complete  Navigator Follow Up Date: 10/14/2020  Navigator Follow Up Reason: New Patient Appointment  Navigator Location [facility]  Navigator Encounter Type Introductory Phone Call  Patient Visit Type MedOnc  Treatment Phase Pre-Tx/Tx Discussion  Barriers/Navigation Needs Coordination of Care;Education  Education Other  Interventions Coordination of Care;Education  Acuity Level 2-Minimal Needs (1-2 Barriers Identified)  Coordination of Care Appts  Education Method Verbal  Time Spent with Patient 45

## 2020-10-09 NOTE — Progress Notes (Signed)
Bx-adenocarcinoma Discussed with the patient in detail Plan: -Refer to Dr. Lattie Haw as soon as available. RG

## 2020-10-10 NOTE — Telephone Encounter (Signed)
Forms completed and placed in [place] B box

## 2020-10-13 NOTE — Telephone Encounter (Signed)
Pt wanted to let Dr. Madilyn Fireman know that he goes tomorrow to find out about the staging of the Cancer

## 2020-10-13 NOTE — Telephone Encounter (Signed)
Form completed,faxed,confirmation received and scanned into patient's chart.

## 2020-10-14 ENCOUNTER — Other Ambulatory Visit: Payer: Self-pay

## 2020-10-14 ENCOUNTER — Inpatient Hospital Stay (HOSPITAL_BASED_OUTPATIENT_CLINIC_OR_DEPARTMENT_OTHER): Payer: BC Managed Care – PPO | Admitting: Hematology & Oncology

## 2020-10-14 ENCOUNTER — Inpatient Hospital Stay: Payer: BC Managed Care – PPO | Attending: Hematology & Oncology

## 2020-10-14 ENCOUNTER — Encounter: Payer: Self-pay | Admitting: Hematology & Oncology

## 2020-10-14 VITALS — BP 123/80 | HR 105 | Temp 98.1°F | Resp 18 | Ht 72.0 in | Wt 203.0 lb

## 2020-10-14 DIAGNOSIS — Z801 Family history of malignant neoplasm of trachea, bronchus and lung: Secondary | ICD-10-CM | POA: Diagnosis not present

## 2020-10-14 DIAGNOSIS — Z79899 Other long term (current) drug therapy: Secondary | ICD-10-CM

## 2020-10-14 DIAGNOSIS — C787 Secondary malignant neoplasm of liver and intrahepatic bile duct: Secondary | ICD-10-CM | POA: Insufficient documentation

## 2020-10-14 DIAGNOSIS — Z87891 Personal history of nicotine dependence: Secondary | ICD-10-CM | POA: Insufficient documentation

## 2020-10-14 DIAGNOSIS — I10 Essential (primary) hypertension: Secondary | ICD-10-CM | POA: Insufficient documentation

## 2020-10-14 DIAGNOSIS — D500 Iron deficiency anemia secondary to blood loss (chronic): Secondary | ICD-10-CM | POA: Diagnosis not present

## 2020-10-14 DIAGNOSIS — C160 Malignant neoplasm of cardia: Secondary | ICD-10-CM | POA: Insufficient documentation

## 2020-10-14 DIAGNOSIS — Z5112 Encounter for antineoplastic immunotherapy: Secondary | ICD-10-CM | POA: Diagnosis present

## 2020-10-14 DIAGNOSIS — C7802 Secondary malignant neoplasm of left lung: Secondary | ICD-10-CM | POA: Diagnosis not present

## 2020-10-14 DIAGNOSIS — C7801 Secondary malignant neoplasm of right lung: Secondary | ICD-10-CM | POA: Insufficient documentation

## 2020-10-14 DIAGNOSIS — E039 Hypothyroidism, unspecified: Secondary | ICD-10-CM | POA: Insufficient documentation

## 2020-10-14 DIAGNOSIS — C169 Malignant neoplasm of stomach, unspecified: Secondary | ICD-10-CM

## 2020-10-14 DIAGNOSIS — Z5111 Encounter for antineoplastic chemotherapy: Secondary | ICD-10-CM | POA: Insufficient documentation

## 2020-10-14 DIAGNOSIS — Z7189 Other specified counseling: Secondary | ICD-10-CM | POA: Insufficient documentation

## 2020-10-14 HISTORY — DX: Malignant neoplasm of cardia: C16.0

## 2020-10-14 LAB — CBC WITH DIFFERENTIAL (CANCER CENTER ONLY)
Abs Immature Granulocytes: 0.02 10*3/uL (ref 0.00–0.07)
Basophils Absolute: 0 10*3/uL (ref 0.0–0.1)
Basophils Relative: 1 %
Eosinophils Absolute: 0.2 10*3/uL (ref 0.0–0.5)
Eosinophils Relative: 4 %
HCT: 34.3 % — ABNORMAL LOW (ref 39.0–52.0)
Hemoglobin: 11.2 g/dL — ABNORMAL LOW (ref 13.0–17.0)
Immature Granulocytes: 0 %
Lymphocytes Relative: 16 %
Lymphs Abs: 0.9 10*3/uL (ref 0.7–4.0)
MCH: 29.1 pg (ref 26.0–34.0)
MCHC: 32.7 g/dL (ref 30.0–36.0)
MCV: 89.1 fL (ref 80.0–100.0)
Monocytes Absolute: 0.8 10*3/uL (ref 0.1–1.0)
Monocytes Relative: 14 %
Neutro Abs: 3.6 10*3/uL (ref 1.7–7.7)
Neutrophils Relative %: 65 %
Platelet Count: 295 10*3/uL (ref 150–400)
RBC: 3.85 MIL/uL — ABNORMAL LOW (ref 4.22–5.81)
RDW: 14.2 % (ref 11.5–15.5)
WBC Count: 5.5 10*3/uL (ref 4.0–10.5)
nRBC: 0 % (ref 0.0–0.2)

## 2020-10-14 LAB — CMP (CANCER CENTER ONLY)
ALT: 19 U/L (ref 0–44)
AST: 22 U/L (ref 15–41)
Albumin: 4.1 g/dL (ref 3.5–5.0)
Alkaline Phosphatase: 100 U/L (ref 38–126)
Anion gap: 7 (ref 5–15)
BUN: 11 mg/dL (ref 8–23)
CO2: 31 mmol/L (ref 22–32)
Calcium: 9.8 mg/dL (ref 8.9–10.3)
Chloride: 102 mmol/L (ref 98–111)
Creatinine: 0.82 mg/dL (ref 0.61–1.24)
GFR, Estimated: >60 mL/min (ref >60)
Glucose, Bld: 119 mg/dL — ABNORMAL HIGH (ref 70–99)
Potassium: 4.1 mmol/L (ref 3.5–5.1)
Sodium: 140 mmol/L (ref 135–145)
Total Bilirubin: 0.3 mg/dL (ref 0.3–1.2)
Total Protein: 6.2 g/dL — ABNORMAL LOW (ref 6.5–8.1)

## 2020-10-14 LAB — LACTATE DEHYDROGENASE: LDH: 148 U/L (ref 98–192)

## 2020-10-14 NOTE — Progress Notes (Signed)
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 New Patient (Initial Visit)  : I have cancer that is spread.  HPI: Mr. Eddie Hernandez is a very nice 65 year old white male.  He is originally from [place].  He has been down in [place] for about 30 years.  He worked for [employer] up in [place].  When they close the office, he moved down to [place] to work in the [employer].  Unfortunately, this office close down.  He now is working in Scientist, research (medical) at [employer].  He did smoke.  He stopped 5 years ago.  He probably has a 50-pack-year history of tobacco use.  He is a recovering alcoholic.  He has not had any alcoholic beverages now for about 6 years.  He began to have some issues with abdominal discomfort.  He has some bloating.  He had a CT of the abdomen pelvis initially.  This apparently showed some abnormality in the lower chest.  This was followed by CT scan of the chest. This was done on 09/10/2020.  This showed enlarged right paratracheal lymph node.  Had numerous bilateral pulmonary nodules.  He then had a PET scan done.  This was done on 10/01/2020.  This showed activity in the right paratracheal lymph node.  He had activity in the pulmonary nodules.  There was intense activity at the GE junction.  This measured 3 x 4 cm.  It had SUV of 10.2.  Also noted was some activity in the right hepatic lobe.  He has some activity in a lymph node deep to the IVC.  He then was seen by Dr. Lyndel Safe.  He did have a upper endoscopy done on 10/07/2020.  A mass was noted at the GE junction.  This was biopsied.  The pathology report ([id number]) showed adenocarcinoma.  No other analysis was presented.  Because of this, he was then referred to the [facility] for an evaluation.  He has lost about 10 pounds.  He says that whenever he eats, he gets bloated.  He has had some  abdominal discomfort.  He has had no diarrhea.  There has been no melena or bright red blood per rectum.  He has had no cough.  He has had no shortness of breath.  He has had no rashes.  There is been no headaches.  I do not think there is any history of malignancy in the family.  However, I do think that there may be history of lung cancer in his sister.  She apparently has some other malignancy that she died from.  He does have a very interesting history.  He has had deep brain stimulator electrodes placed because of tremors.  This was several years ago.  They are still in place.  The battery pack was taken out.  I would have to say that overall, his performance status is ECOG 0     Past Medical History:  Diagnosis Date  . Alcohol abuse   . Bipolar 1 disorder ([place])   . Constipation   . Emphysema of lung ([place])   . GERD (gastroesophageal reflux disease)   . Hyperlipidemia   . Hypertension   . Hypothyroidism   . PAD (peripheral artery disease) ([place])   . Peripheral neuropathy    small fiber  . Prostate pain   . Stroke [facility])    Per CT scan - Old - pt  was not aware   . Tremor   :  Past Surgical History:  Procedure Laterality Date  . brain stimulator removed    . COLONOSCOPY    . DEEP BRAIN STIMULATOR PLACEMENT  01-30-08   tremors  . ILIAC ARTERY STENT     x2  :   Current Outpatient Medications:  .  ARIPiprazole (ABILIFY) 5 MG tablet, TAKE ONE TABLET (5 MG TOTAL) BY MOUTH DAILY, Disp: 30 tablet, Rfl: 3 .  atorvastatin (LIPITOR) 20 MG tablet, Take 40 mg by mouth at bedtime., Disp: , Rfl:  .  cilostazol (PLETAL) 100 MG tablet, Take 100 mg by mouth 2 (two) times daily., Disp: , Rfl:  .  dicyclomine (BENTYL) 10 MG capsule, Take 1 capsule (10 mg total) by mouth 3 (three) times daily before meals., Disp: 90 capsule, Rfl: 0 .  DULoxetine (CYMBALTA) 30 MG capsule, Take 30 mg by mouth 2 (two) times daily. , Disp: , Rfl:  .  levothyroxine (SYNTHROID) 125 MCG tablet, TAKE 1 TABLET  BY MOUTH 6 DAYS A WEEK, AND 1/2 TABLET 1 DAY., Disp: 135 tablet, Rfl: 1 .  lisinopril (ZESTRIL) 20 MG tablet, Take 20 mg by mouth daily., Disp: , Rfl:  .  lubiprostone (AMITIZA) 8 MCG capsule, Take 1 capsule (8 mcg total) by mouth 2 (two) times daily with a meal. (Patient taking differently: Take 8 mcg by mouth 2 (two) times daily with a meal. 24 once, 8 bid), Disp: 60 capsule, Rfl: 3 .  mirtazapine (REMERON SOL-TAB) 15 MG disintegrating tablet, Take 15 mg by mouth daily., Disp: , Rfl:  .  NUVIGIL 250 MG tablet, Take 250 mg by mouth daily., Disp: , Rfl:  .  pantoprazole (PROTONIX) 40 MG tablet, Take 1 tablet by mouth daily., Disp: , Rfl:  .  pregabalin (LYRICA) 200 MG capsule, Take 1 capsule by mouth 3 (three) times daily., Disp: , Rfl:  .  protriptyline (VIVACTIL) 10 MG tablet, Take 10 mg by mouth 2 (two) times daily., Disp: , Rfl:  .  SODIUM FLUORIDE 5000 PPM 1.1 % PSTE, SMARTSIG:Sparingly Topical Every Night, Disp: , Rfl:  .  traMADol (ULTRAM) 50 MG tablet, Take 1 tablet (50 mg total) by mouth every 8 (eight) hours as needed., Disp: 90 tablet, Rfl: 0 .  Multiple Vitamins-Minerals (MULTIVITAMIN MEN 50+) TABS, Take 1 tablet by mouth daily. (Patient not taking: Reported on 10/14/2020), Disp: , Rfl:  .  pentoxifylline (TRENTAL) 400 MG CR tablet, , Disp: , Rfl: :  :  No Known Allergies:  Family History  Problem Relation Age of Onset  . Stroke Father   . Alcoholism Father   . Cancer Sister        Lung   . Colon cancer Neg Hx   . Colon polyps Neg Hx   . Esophageal cancer Neg Hx   . Rectal cancer Neg Hx   . Stomach cancer Neg Hx   :  Social History   Socioeconomic History  . Marital status: Married    Spouse name: Maudry Mayhew   . Number of children: Not on file  . Years of education: Not on file  . Highest education level: Not on file  Occupational History  . Occupation: works in Scientist, research (medical).      Comment: [name]  Tobacco Use  . Smoking status: Former Smoker    Packs/day: 1.00     Years: 45.00    Pack years: 45.00    Types: Cigarettes    Quit date: 09/08/2017  Years since quitting: 3.1  . Smokeless tobacco: Never Used  . Tobacco comment: Pt does vape currently   Vaping Use  . Vaping Use: Every day  . Substances: Nicotine  Substance and Sexual Activity  . Alcohol use: No    Alcohol/week: 0.0 standard drinks    Comment: hx of EtOH abuse  . Drug use: No  . Sexual activity: Not on file  Other Topics Concern  . 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 No active exercise.    Social Determinants of Health   Financial Resource Strain: Not on file  Food Insecurity: Not on file  Transportation Needs: Not on file  Physical Activity: Not on file  Stress: Not on file  Social Connections: Not on file  Intimate Partner Violence: Not on file  :  Review of Systems  Constitutional: Negative.   HENT: Negative.   Eyes: Negative.   Respiratory: Negative.   Cardiovascular: Negative.   Gastrointestinal: Positive for abdominal pain and heartburn.  Genitourinary: Negative.   Musculoskeletal: Negative.   Skin: Negative.   Neurological: Negative.   Endo/Heme/Allergies: Negative.   Psychiatric/Behavioral: Negative.      Exam:  This is a well-developed and well-nourished white male in no obvious distress.  Vital signs show temperature of 98.1.  Pulse 82.  Blood pressure 123/80.  Weight is 203 pounds.  Physical Exam Vitals reviewed.  HENT:     Head: Normocephalic and atraumatic.  Eyes:     Pupils: Pupils are equal, round, and reactive to light.  Cardiovascular:     Rate and Rhythm: Normal rate and regular rhythm.     Heart sounds: Normal heart sounds.  Pulmonary:     Effort: Pulmonary effort is normal.     Breath sounds: Normal breath sounds.  Abdominal:     General: Bowel sounds are normal.     Palpations: Abdomen is soft.  Musculoskeletal:        General: No tenderness or deformity. Normal range of motion.      Cervical back: Normal range of motion.  Lymphadenopathy:     Cervical: No cervical adenopathy.  Skin:    General: Skin is warm and dry.     Findings: No erythema or rash.  Neurological:     Mental Status: He is alert and oriented to person, place, and time.  Psychiatric:        Behavior: Behavior normal.        Thought Content: Thought content normal.        Judgment: Judgment normal.    _0 @   Recent Labs    10/14/20 1413  WBC 5.5  HGB 11.2*  HCT 34.3*  PLT 295   Recent Labs    10/14/20 1413  NA 140  K 4.1  CL 102  CO2 31  GLUCOSE 119*  BUN 11  CREATININE 0.82  CALCIUM 9.8    Blood smear review: None  Pathology: See above    Assessment and Plan: Mr. Reasons is a very nice 65 year old white male.  Again, he comes in with his wife.  He and she are both wonderful to talk to.  They are both delightful people.  It was fun talking to them about [place].  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  I told them that this is a malignancy that we can treat but we cannot cure.  They understand this.  I think the real key is  going to be whether or not the tumor is positive for HER2 mutation.  This will be very important.  This will help [place] with our therapeutic options.  I do think that he will need to have a MRI of the brain done.  However, he does have the electrodes in place in the brain from the deep brain stimulation.  I will know if this would preclude an MRI from being done.  We will see if radiology thinks this is a case.  He is in great shape.  He would be a great candidate for chemotherapy along with immunotherapy.  We can certainly get started with this for right now.  Whether not we need Herceptin will be dictated by the HER2 status of the tumor.  He will need to have a Port-A-Cath placed.  I talked him about a Port-A-Cath in how it works.  He understands this.  I do think that we can get a good response to treatment.  Adenocarcinomas of the GE  junction typically are responsive to chemotherapy and immunotherapy.  I think that the response rate should be over 70%.  Again he is in good shape.  We can be aggressive.  I went over side effects of treatment.  I gave him information sheets about each medication.  I would use FOLFOX with nivolumab.  This would be considered standard therapy nowadays.  Would be nice to try to get started next week if we can.  I would give him 4 cycles of treatment and then we would repeat his PET scan.  I spent a good hour with he and his wife.  Answered all of their questions.  We certainly had a good time talking.  I will plan to see him back when he starts his second cycle of treatment.

## 2020-10-14 NOTE — Progress Notes (Signed)
START ON PATHWAY REGIMEN - Gastroesophageal     A cycle is every 14 days:     Nivolumab      Oxaliplatin      Leucovorin      Fluorouracil      Fluorouracil   **Always confirm dose/schedule in your pharmacy ordering system**  Patient Characteristics: Distant Metastases (cM1/pM1) / Locally Recurrent Disease, Adenocarcinoma - Esophageal, GE Junction, and Gastric, First Line, HER2 Negative/Unknown, PD?L1 Expression  PositiveCPS ? 5 Histology: Adenocarcinoma Disease Classification: GE Junction Therapeutic Status: Distant Metastases (No Additional Staging) Line of Therapy: First Line HER2 Status: Awaiting Test Results PD-L1 Expression Status: PD-L1 Expression Positive CPS ? 5 Intent of Therapy: Non-Curative / Palliative Intent, Discussed with Patient

## 2020-10-15 ENCOUNTER — Telehealth: Payer: Self-pay

## 2020-10-15 ENCOUNTER — Encounter: Payer: Self-pay | Admitting: *Deleted

## 2020-10-15 ENCOUNTER — Encounter: Payer: Self-pay | Admitting: Hematology & Oncology

## 2020-10-15 DIAGNOSIS — D500 Iron deficiency anemia secondary to blood loss (chronic): Secondary | ICD-10-CM

## 2020-10-15 HISTORY — DX: Iron deficiency anemia secondary to blood loss (chronic): D50.0

## 2020-10-15 LAB — IRON AND TIBC
Iron: 35 ug/dL — ABNORMAL LOW (ref 42–163)
Saturation Ratios: 9 % — ABNORMAL LOW (ref 20–55)
TIBC: 376 ug/dL (ref 202–409)
UIBC: 341 ug/dL (ref 117–376)

## 2020-10-15 LAB — FERRITIN: Ferritin: 20 ng/mL — ABNORMAL LOW (ref 24–336)

## 2020-10-15 LAB — CEA (IN HOUSE-CHCC): CEA (CHCC-In House): 3.28 ng/mL (ref 0.00–5.00)

## 2020-10-15 NOTE — Progress Notes (Signed)
Navigator out of the office for patient's new patient appointment.  Per Dr Antonieta Pert request oncotype MAP testing request sent on specimen [id number] DOS 10/07/20.   Patient will need port for planned treatment to begin next week. Chemo education already scheduled for 10/17/2020.  Spoke with IR and port is scheduled for 10/21/20. They will reach out to patient for notification. Dr Marin Olp notified.   MRI cannot yet be scheduled as they have to evaluate the patient's implanted brain stimulator leads.  Oncology Nurse Navigator Documentation  Oncology Nurse Navigator Flowsheets 10/15/2020  Abnormal Finding Date -  Confirmed Diagnosis Date -  Diagnosis Status -  Navigator Follow Up Date: 10/17/2020  Navigator Follow Up Reason: Chemo Class  Navigator Location [facility]  Navigator Encounter Type Appt/Treatment Plan Review;Molecular Studies  Patient Visit Type MedOnc  Treatment Phase Pre-Tx/Tx Discussion  Barriers/Navigation Needs Coordination of Care;Education  Education -  Interventions Coordination of Care;Other  Acuity Level 2-Minimal Needs (1-2 Barriers Identified)  Coordination of Care Radiology  Education Method -  Support Groups/Services Friends and Family  Time Spent with Patient 53

## 2020-10-15 NOTE — Addendum Note (Signed)
Addended by: Burney Gauze R on: 10/15/2020 03:40 PM   Modules accepted: Orders

## 2020-10-15 NOTE — Telephone Encounter (Signed)
appts made per 10/14/20 los, pt to gain sch in office in 5/20 at chemo ed and through [employer]

## 2020-10-16 ENCOUNTER — Other Ambulatory Visit: Payer: Self-pay | Admitting: *Deleted

## 2020-10-16 ENCOUNTER — Encounter: Payer: Self-pay | Admitting: *Deleted

## 2020-10-16 DIAGNOSIS — C160 Malignant neoplasm of cardia: Secondary | ICD-10-CM

## 2020-10-16 MED ORDER — ONDANSETRON HCL 8 MG PO TABS: 8.0000 mg | ORAL_TABLET | Freq: Two times a day (BID) | ORAL | 1 refills | Status: DC | PRN

## 2020-10-16 MED ORDER — DEXAMETHASONE 4 MG PO TABS: 8.0000 mg | ORAL_TABLET | Freq: Every day | ORAL | 1 refills | Status: DC

## 2020-10-16 MED ORDER — LIDOCAINE-PRILOCAINE 2.5-2.5 % EX CREA: TOPICAL_CREAM | CUTANEOUS | 3 refills | Status: DC

## 2020-10-16 MED ORDER — PROCHLORPERAZINE MALEATE 10 MG PO TABS: 10.0000 mg | ORAL_TABLET | Freq: Four times a day (QID) | ORAL | 1 refills | Status: DC | PRN

## 2020-10-16 NOTE — Progress Notes (Signed)
Received notification that patient cannot have MRI due to implanted deep brain stimulator leads. Will follow up with Dr Marin Olp when he returns to the office on Monday.  Oncology Nurse Navigator Documentation  Oncology Nurse Navigator Flowsheets 10/16/2020  Abnormal Finding Date -  Confirmed Diagnosis Date -  Diagnosis Status -  Navigator Follow Up Date: 10/17/2020  Navigator Follow Up Reason: Chemo Class  Navigator Location [facility]  Navigator Encounter Type Appt/Treatment Plan Review  Patient Visit Type MedOnc  Treatment Phase Pre-Tx/Tx Discussion  Barriers/Navigation Needs Coordination of Care;Education  Education -  Interventions Coordination of Care  Acuity Level 2-Minimal Needs (1-2 Barriers Identified)  Coordination of Care Radiology  Education Method -  Support Groups/Services Friends and Family  Time Spent with Patient 15

## 2020-10-17 ENCOUNTER — Inpatient Hospital Stay: Payer: BC Managed Care – PPO

## 2020-10-17 ENCOUNTER — Other Ambulatory Visit: Payer: Self-pay

## 2020-10-17 ENCOUNTER — Encounter: Payer: Self-pay | Admitting: *Deleted

## 2020-10-17 NOTE — Progress Notes (Signed)
Patient in chemotherapy education class with  Wife Maudry Mayhew.  Discussed side effects of 5FU, Leucovorin, Oxaliplatin and Opdivo  which include but are not limited to myelosuppression, decreased appetite, fatigue, fever, allergic or infusional reaction, mucositis, cardiac toxicity, cough, SOB, altered taste, nausea and vomiting, diarrhea, constipation, elevated LFTs myalgia and arthralgias, hair loss or thinning, rash, skin dryness, nail changes, peripheral neuropathy, discolored urine, delayed wound healing, mental changes (Chemo brain), increased risk of infections, weight loss.  Reviewed infusion room and office policy and procedure and phone numbers 24 hours x 7 days a week.  Reviewed ambulatory pump specifics and how to manage safe handling at home.  Reviewed when to call the office with any concerns or problems.  Scientist, clinical (histocompatibility and immunogenetics) given.  Discussed portacath insertion and EMLA cream administration.  Antiemetic protocol and chemotherapy schedule reviewed. Patient verbalized understanding of chemotherapy indications and possible side effects.  Teachback done

## 2020-10-20 ENCOUNTER — Other Ambulatory Visit: Payer: Self-pay | Admitting: Radiology

## 2020-10-20 ENCOUNTER — Encounter: Payer: Self-pay | Admitting: *Deleted

## 2020-10-20 DIAGNOSIS — C169 Malignant neoplasm of stomach, unspecified: Secondary | ICD-10-CM

## 2020-10-20 DIAGNOSIS — C160 Malignant neoplasm of cardia: Secondary | ICD-10-CM

## 2020-10-20 NOTE — Progress Notes (Signed)
Spoke to Dr Marin Olp regarding the inability to have an MRI due to the stimulator electrodes. Dr Marin Olp would like the patient to have a CT scan instead.   Order placed for CT. Called patient and notified him of change in orders.   Patient asked about an appointment with his urologist to discuss workup to rule out prostate cancer. He wondered if he needed to keep this appointment because if he's getting chemo for the GE junction cancer, wouldn't that treat prostate cancer, if he indeed has it. Educated patient on different treatment options for prostate cancer and how these two cancers are different and require different treatments. Explained that the chemo may have some benefit to the prostate, but he needed to have workup completed so that the best possible treatment could be determined. He understood and will keep appointments.   Oncology Nurse Navigator Documentation  Oncology Nurse Navigator Flowsheets 10/20/2020  Abnormal Finding Date -  Confirmed Diagnosis Date -  Diagnosis Status -  Navigator Follow Up Date: 10/22/2020  Navigator Follow Up Reason: Chemotherapy  Navigator Location [facility]  Navigator Encounter Type Appt/Treatment Plan Review;Telephone  Telephone Outgoing Call;Education  Patient Visit Type MedOnc  Treatment Phase Pre-Tx/Tx Discussion  Barriers/Navigation Needs Coordination of Care;Education  Education Other  Interventions Coordination of Care;Education;Psycho-Social Support  Acuity Level 2-Minimal Needs (1-2 Barriers Identified)  Coordination of Care Radiology  Education Method Verbal  Support Groups/Services Friends and Family  Time Spent with Patient 30

## 2020-10-21 ENCOUNTER — Other Ambulatory Visit: Payer: Self-pay | Admitting: Hematology & Oncology

## 2020-10-21 ENCOUNTER — Ambulatory Visit (HOSPITAL_COMMUNITY)
Admission: RE | Admit: 2020-10-21 | Discharge: 2020-10-21 | Disposition: A | Discharge status: Home / Self Care | Payer: BC Managed Care – PPO | Source: Ambulatory Visit | Attending: Hematology & Oncology | Admitting: Hematology & Oncology

## 2020-10-21 ENCOUNTER — Encounter (HOSPITAL_COMMUNITY): Payer: Self-pay

## 2020-10-21 ENCOUNTER — Other Ambulatory Visit: Payer: Self-pay

## 2020-10-21 DIAGNOSIS — Z87891 Personal history of nicotine dependence: Secondary | ICD-10-CM | POA: Diagnosis not present

## 2020-10-21 DIAGNOSIS — R251 Tremor, unspecified: Secondary | ICD-10-CM | POA: Insufficient documentation

## 2020-10-21 DIAGNOSIS — Z823 Family history of stroke: Secondary | ICD-10-CM | POA: Diagnosis not present

## 2020-10-21 DIAGNOSIS — E785 Hyperlipidemia, unspecified: Secondary | ICD-10-CM | POA: Insufficient documentation

## 2020-10-21 DIAGNOSIS — Z79899 Other long term (current) drug therapy: Secondary | ICD-10-CM | POA: Insufficient documentation

## 2020-10-21 DIAGNOSIS — G629 Polyneuropathy, unspecified: Secondary | ICD-10-CM | POA: Insufficient documentation

## 2020-10-21 DIAGNOSIS — C160 Malignant neoplasm of cardia: Secondary | ICD-10-CM

## 2020-10-21 DIAGNOSIS — Z95828 Presence of other vascular implants and grafts: Secondary | ICD-10-CM | POA: Insufficient documentation

## 2020-10-21 DIAGNOSIS — Z79890 Hormone replacement therapy (postmenopausal): Secondary | ICD-10-CM | POA: Diagnosis not present

## 2020-10-21 DIAGNOSIS — I739 Peripheral vascular disease, unspecified: Secondary | ICD-10-CM | POA: Diagnosis not present

## 2020-10-21 DIAGNOSIS — J439 Emphysema, unspecified: Secondary | ICD-10-CM | POA: Diagnosis not present

## 2020-10-21 DIAGNOSIS — Z801 Family history of malignant neoplasm of trachea, bronchus and lung: Secondary | ICD-10-CM | POA: Insufficient documentation

## 2020-10-21 DIAGNOSIS — E039 Hypothyroidism, unspecified: Secondary | ICD-10-CM | POA: Diagnosis not present

## 2020-10-21 DIAGNOSIS — I10 Essential (primary) hypertension: Secondary | ICD-10-CM | POA: Insufficient documentation

## 2020-10-21 DIAGNOSIS — Z8673 Personal history of transient ischemic attack (TIA), and cerebral infarction without residual deficits: Secondary | ICD-10-CM | POA: Insufficient documentation

## 2020-10-21 HISTORY — PX: IR IMAGING GUIDED PORT INSERTION: IMG5740

## 2020-10-21 MED ORDER — MIDAZOLAM HCL 2 MG/2ML IJ SOLN
INTRAMUSCULAR | Status: AC | PRN
  Administered 2020-10-21 (×2): 1 mg via INTRAVENOUS

## 2020-10-21 MED ORDER — FENTANYL CITRATE (PF) 100 MCG/2ML IJ SOLN
INTRAMUSCULAR | Status: AC | PRN
  Administered 2020-10-21 (×3): 50 ug via INTRAVENOUS

## 2020-10-21 MED ORDER — MIDAZOLAM HCL 2 MG/2ML IJ SOLN
INTRAMUSCULAR | Status: AC
  Filled 2020-10-21: qty 4

## 2020-10-21 MED ORDER — LIDOCAINE-EPINEPHRINE 1 %-1:100000 IJ SOLN
INTRAMUSCULAR | Status: AC
  Filled 2020-10-21: qty 1

## 2020-10-21 MED ORDER — LIDOCAINE-EPINEPHRINE 1 %-1:100000 IJ SOLN
INTRAMUSCULAR | Status: AC | PRN
  Administered 2020-10-21: 10 mL via INTRADERMAL

## 2020-10-21 MED ORDER — LIDOCAINE HCL 1 % IJ SOLN
INTRAMUSCULAR | Status: AC
  Filled 2020-10-21: qty 20

## 2020-10-21 MED ORDER — FENTANYL CITRATE (PF) 100 MCG/2ML IJ SOLN
INTRAMUSCULAR | Status: AC
  Filled 2020-10-21: qty 4

## 2020-10-21 MED ORDER — LIDOCAINE HCL 1 % IJ SOLN
INTRAMUSCULAR | Status: AC | PRN
  Administered 2020-10-21: 10 mL via INTRADERMAL

## 2020-10-21 MED ORDER — SODIUM CHLORIDE 0.9 % IV SOLN: INTRAVENOUS | Status: DC

## 2020-10-21 MED ORDER — HEPARIN SOD (PORK) LOCK FLUSH 100 UNIT/ML IV SOLN
INTRAVENOUS | Status: AC | PRN
  Administered 2020-10-21: 500 [IU] via INTRAVENOUS

## 2020-10-21 MED ORDER — HEPARIN SOD (PORK) LOCK FLUSH 100 UNIT/ML IV SOLN
INTRAVENOUS | Status: AC
  Filled 2020-10-21: qty 5

## 2020-10-21 NOTE — H&P (Signed)
Chief Complaint: Patient was seen in consultation today for port placement at the request of Ennever,Doyle R  Referring Physician(s): Ennever,Kazuo R  Supervising Physician: Markus Daft  Patient Status: [facility] - Out-pt  History of Present Illness: Eddie Hernandez is a 65 y.o. 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 He is to start chemotherapy soon and is referred for port placement. PMHx, meds, labs, imaging, allergies reviewed. Feels well, no recent fevers, chills, illness. Has been NPO today as directed.  Past Medical History:  Diagnosis Date  . Alcohol abuse   . Bipolar 1 disorder ([place])   . Constipation   . Emphysema of lung ([place])   . 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 GERD (gastroesophageal reflux disease)   . Hyperlipidemia   . Hypertension   . Hypothyroidism   . Iron deficiency anemia due to chronic blood loss 10/15/2020  . PAD (peripheral artery disease) ([place])   . Peripheral neuropathy    small fiber  . Prostate pain   . Stroke [facility])    Per CT scan - Old - pt was not aware   . Tremor     Past Surgical History:  Procedure Laterality Date  . brain stimulator removed    . COLONOSCOPY    . DEEP BRAIN STIMULATOR PLACEMENT  01-30-08   tremors  . ILIAC ARTERY STENT     x2    Allergies: Patient has no known allergies.  Medications: Prior to Admission medications   Medication Sig Start Date End Date Taking? Authorizing Provider  acetaminophen (TYLENOL) 500 MG tablet Take 1,000 mg by mouth every 6 (six) hours as needed.   Yes [provider]  ARIPiprazole (ABILIFY) 5 MG tablet TAKE ONE TABLET (5 MG TOTAL) BY MOUTH DAILY 07/21/16  Yes Hali Marry, MD  atorvastatin (LIPITOR) 20 MG tablet Take 40 mg by mouth at bedtime.   Yes [provider]  cilostazol (PLETAL) 100 MG tablet Take 100 mg by mouth 2 (two) times daily. 09/03/20  Yes [provider]  dicyclomine (BENTYL) 10 MG capsule Take 1 capsule (10 mg total)  by mouth 3 (three) times daily before meals. 09/23/20  Yes Hali Marry, MD  DULoxetine (CYMBALTA) 30 MG capsule Take 30 mg by mouth 2 (two) times daily.  10/26/19  Yes [provider]  levothyroxine (SYNTHROID) 125 MCG tablet TAKE 1 TABLET BY MOUTH 6 DAYS A WEEK, AND 1/2 TABLET 1 DAY. 08/03/20  Yes Hali Marry, MD  lisinopril (ZESTRIL) 20 MG tablet Take 20 mg by mouth daily. 12/21/19  Yes Powers, Elyse Jarvis, MD  mirtazapine (REMERON SOL-TAB) 15 MG disintegrating tablet Take 15 mg by mouth daily. 01/30/15  Yes [provider]  NUVIGIL 250 MG tablet Take 250 mg by mouth daily. 03/18/15  Yes [provider]  pantoprazole (PROTONIX) 40 MG tablet Take 1 tablet by mouth daily. 08/29/20  Yes [provider]  pregabalin (LYRICA) 200 MG capsule Take 1 capsule by mouth 3 (three) times daily. 07/24/19  Yes [provider]  protriptyline (VIVACTIL) 10 MG tablet Take 10 mg by mouth 2 (two) times daily.   Yes [provider]  SODIUM FLUORIDE 5000 PPM 1.1 % PSTE SMARTSIG:Sparingly Topical Every Night 08/28/20  Yes [provider]  traMADol (ULTRAM) 50 MG tablet Take 1 tablet (50 mg total) by mouth every 8 (eight) hours as needed. 10/03/20  Yes Hali Marry, MD  dexamethasone (DECADRON) 4 MG tablet Take 2 tablets (8 mg  total) by mouth daily. Start the day after chemotherapy for 2 days. Take with food. 10/16/20   [name], MD  lidocaine-prilocaine (EMLA) cream Apply to affected area once 10/16/20   Ennever, Rudell Cobb, MD  lubiprostone (AMITIZA) 8 MCG capsule Take 1 capsule (8 mcg total) by mouth 2 (two) times daily with a meal. Patient taking differently: Take 8 mcg by mouth 2 (two) times daily with a meal. 24 once, 8 bid 09/15/20   Silverio Decamp, MD  Multiple Vitamins-Minerals (MULTIVITAMIN MEN 50+) TABS Take 1 tablet by mouth daily. Patient not taking: No sig reported    [provider]  ondansetron (ZOFRAN) 8 MG tablet  Take 1 tablet (8 mg total) by mouth 2 (two) times daily as needed for refractory nausea / vomiting. Start on day 3 after chemotherapy. 10/16/20   [name], MD  pentoxifylline (TRENTAL) 400 MG CR tablet  08/07/19   [provider]  prochlorperazine (COMPAZINE) 10 MG tablet Take 1 tablet (10 mg total) by mouth every 6 (six) hours as needed (Nausea or vomiting). 10/16/20   [name], MD     Family History  Problem Relation Age of Onset  . Stroke Father   . Alcoholism Father   . Cancer Sister        Lung   . Colon cancer Neg Hx   . Colon polyps Neg Hx   . Esophageal cancer Neg Hx   . Rectal cancer Neg Hx   . Stomach cancer Neg Hx     Social History   Socioeconomic History  . Marital status: Married    Spouse name: Maudry Mayhew   . Number of children: Not on file  . Years of education: Not on file  . Highest education level: Not on file  Occupational History  . Occupation: works in Scientist, research (medical).      Comment: [name]  Tobacco Use  . Smoking status: Former Smoker    Packs/day: 1.00    Years: 45.00    Pack years: 45.00    Types: Cigarettes    Quit date: 09/08/2017    Years since quitting: 3.1  . Smokeless tobacco: Never Used  . Tobacco comment: Pt does vape currently   Vaping Use  . Vaping Use: Every day  . Substances: Nicotine  Substance and Sexual Activity  . Alcohol use: No    Alcohol/week: 0.0 standard drinks    Comment: hx of EtOH abuse  . Drug use: No  . Sexual activity: Not on file  Other Topics Concern  . 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 No active exercise.    Social Determinants of Health   Financial Resource Strain: Not on file  Food Insecurity: Not on file  Transportation Needs: Not on file  Physical Activity: Not on file  Stress: Not on file  Social Connections: Not on file    Review of Systems: A 12 point ROS discussed and pertinent positives are indicated in the HPI above.  All other systems  are negative.  Review of Systems  Vital Signs: BP (!) 141/73   Pulse 73   Temp 98.1 F (36.7 C) (Oral)   Resp 20   SpO2 99%   Physical Exam Constitutional:      Appearance: Normal appearance.  HENT:     Mouth/Throat:     Mouth: Mucous membranes are moist.     Pharynx: Oropharynx is clear.  Cardiovascular:  Rate and Rhythm: Normal rate and regular rhythm.     Heart sounds: Normal heart sounds.  Pulmonary:     Effort: Pulmonary effort is normal. No respiratory distress.     Breath sounds: Normal breath sounds.  Skin:    General: Skin is warm and dry.  Neurological:     General: No focal deficit present.     Mental Status: He is alert and oriented to person, place, and time.  Psychiatric:        Mood and Affect: Mood normal.        Thought Content: Thought content normal.        Judgment: Judgment normal.     Imaging: NM PET Image Initial (PI) Skull Base To Thigh  Result Date: 10/01/2020 CLINICAL DATA:  Initial treatment strategy for pulmonary nodu nodules le. EXAM: NUCLEAR MEDICINE PET SKULL BASE TO THIGH TECHNIQUE: 9.74 mCi F-18 FDG was injected intravenously. Full-ring PET imaging was performed from the skull base to thigh after the radiotracer. CT data was obtained and used for attenuation correction and anatomic localization. Fasting blood glucose: 170 mg/dl COMPARISON:  CT 09/10/2020 FINDINGS: Mediastinal blood pool activity: SUV max 2.5 Liver activity: SUV max 5 NECK: No hypermetabolic lymph nodes in the neck. Incidental CT findings: none CHEST: Bilateral small pulmonary nodules again demonstrated. Some larger nodules have radiotracer activity. Example nodule in the LEFT lower lobe with SUV max equal 2.2. Example RIGHT lower lobe nodule on image 494) Hypermetabolic high RIGHT paratracheal node measures 13 mm with SUV max equal 5.7 Incidental CT findings: none ABDOMEN/PELVIS: Intense circumferential hypermetabolic activity through the GE junction and gastric cardia with  SUV max equal 10.2. Hypermetabolic activity involves approximately 3 cm x 4 cm region of the proximal stomach. No hypermetabolic gastrohepatic ligament nodes. Hypermetabolic focus within the RIGHT hepatic lobe with SUV max equal 6.1 corresponds to a subtle low-density region on CT measuring 12 mm image 107/4). Hypermetabolic lymph node deep to the IVC measuring 9 mm with SUV max equal 3.8. Node is several cm above the bifurcation. No pelvic lymphadenopathy. Incidental CT findings: Bi-iliac stents noted. SKELETON: No focal hypermetabolic activity to suggest skeletal metastasis. Incidental CT findings: none IMPRESSION: 1. Intense circumferential hypermetabolic activity through the GE junction and gastric cardiac most consistent with primary gastroesophageal carcinoma. 2. Evidence of metastatic lymphadenopathy involving a high RIGHT paratracheal lymph node as well as a periaortic lymph node above the aortic bifurcation. 3. Multiple bilateral small pulmonary nodules some of which have associated mild metabolic activity consistent with pulmonary metastasis. 4. Single focal hypermetabolic lesion within the RIGHT hepatic lobe consistent with hepatic metastasis. 5. No skeletal metastasis. These results will be called to the ordering clinician or representative by the Radiologist Assistant, and communication documented in the PACS or [employer]. Electronically Signed   By: Suzy Bouchard M.D.   On: 10/01/2020 16:36    Labs:  CBC: Recent Labs    07/24/20 1010 10/14/20 1413  WBC 6.4 5.5  HGB 11.9* 11.2*  HCT 36.2* 34.3*  PLT 252 295    COAGS: No results for input(s): INR, APTT in the last 8760 hours.  BMP: Recent Labs    03/05/20 1027 07/24/20 1010 09/05/20 0000 10/14/20 1413  NA 137 137 141 140  K 4.7 4.8 4.7 4.1  CL 104 103 105 102  CO2 28 27 28 31   GLUCOSE 105* 110* 118* 119*  BUN 15 12 9 11   CALCIUM 8.8 8.7 9.5 9.8  CREATININE 0.93 0.82 0.96 0.82  GFRNONAA 87 93  --  >60  GFRAA  101 108  --   --     LIVER FUNCTION TESTS: Recent Labs    03/05/20 1027 10/14/20 1413  BILITOT 0.2 0.3  AST 23 22  ALT 21 19  ALKPHOS  --  100  PROT 6.3 6.2*  ALBUMIN  --  4.1    TUMOR MARKERS: Recent Labs    09/15/20 0000  AFPTM 3.4  CEA 3.1*  CA199 7    Assessment and Plan: GE junction cancer For port placement Risks and benefits of image guided port-a-catheter placement was discussed with the patient including, but not limited to bleeding, infection, pneumothorax, or fibrin sheath development and need for additional procedures.  All of the patient's questions were answered, patient is agreeable to proceed. Consent signed and in chart.    Thank you for this interesting consult.  I greatly enjoyed meeting Eddie Hernandez and look forward to participating in their care.  A copy of this report was sent to the requesting provider on this date.  Electronically Signed: Ascencion Dike, PA-C 10/21/2020, 1:09 PM   I spent a total of 20 minutes in face to face in clinical consultation, greater than 50% of which was counseling/coordinating care for port

## 2020-10-21 NOTE — Procedures (Signed)
Interventional Radiology Procedure:   Indications: GE junction adenocarcinoma  Procedure: Port placement  Findings: Left jugular port, tip at SVC/RA junction  Complications: None     EBL: Minimal, less than 10 ml  Plan: Discharge in one hour.  Keep port site and incisions dry for at least 24 hours.     Lyberti Thrush R. Anselm Pancoast, MD  Pager: [phone]

## 2020-10-21 NOTE — Discharge Instructions (Signed)
Urgent needs - Interventional Radiology on call MD [phone] ° °Wound - May remove dressing and shower in 24 to 48 hours.  Keep site clean and dry.  Replace with bandaid as needed.  Do not submerge in tub or water until site healing well. If closed with glue, glue will flake off on its own. ° °If ordered by your provider, may start Emla cream in 2 weeks or after incision is healed. ° °After completion of treatment, your provider should have you set up for monthly port flushes.  ° ° °Implanted Port Insertion, Care After °This sheet gives you information about how to care for yourself after your procedure. Your health care provider may also give you more specific instructions. If you have problems or questions, contact your health care provider. °What can I expect after the procedure? °After the procedure, it is common to have: °· Discomfort at the port insertion site. °· Bruising on the skin over the port. This should improve over 3-4 days. °Follow these instructions at home: °Port care °· After your port is placed, you will get a manufacturer's information card. The card has information about your port. Keep this card with you at all times. °· Take care of the port as told by your health care provider. Ask your health care provider if you or a family member can get training for taking care of the port at home. A home health care nurse may also take care of the port. °· Make sure to remember what type of port you have. °Incision care °· Follow instructions from your health care provider about how to take care of your port insertion site. Make sure you: °? Wash your hands with soap and water before and after you change your bandage (dressing). If soap and water are not available, use hand sanitizer. °? Change your dressing as told by your health care provider. °? Leave stitches (sutures), skin glue, or adhesive strips in place. These skin closures may need to stay in place for 2 weeks or longer. If adhesive strip  edges start to loosen and curl up, you may trim the loose edges. Do not remove adhesive strips completely unless your health care provider tells you to do that. °· Check your port insertion site every day for signs of infection. Check for: °? Redness, swelling, or pain. °? Fluid or blood. °? Warmth. °? Pus or a bad smell.  °  °  °Activity °· Return to your normal activities as told by your health care provider. Ask your health care provider what activities are safe for you. °· Do not lift anything that is heavier than 10 lb (4.5 kg), or the limit that you are told, until your health care provider says that it is safe. °General instructions °· Take over-the-counter and prescription medicines only as told by your health care provider. °· Do not take baths, swim, or use a hot tub until your health care provider approves. Ask your health care provider if you may take showers. You may only be allowed to take sponge baths. °· Do not drive for 24 hours if you were given a sedative during your procedure. °· Wear a medical alert bracelet in case of an emergency. This will tell any health care providers that you have a port. °· Keep all follow-up visits as told by your health care provider. This is important. °Contact a health care provider if: °· You cannot flush your port with saline as directed, or you cannot draw blood   from the port. °· You have a fever or chills. °· You have redness, swelling, or pain around your port insertion site. °· You have fluid or blood coming from your port insertion site. °· Your port insertion site feels warm to the touch. °· You have pus or a bad smell coming from the port insertion site. °Get help right away if: °· You have chest pain or shortness of breath. °· You have bleeding from your port that you cannot control. °Summary °· Take care of the port as told by your health care provider. Keep the manufacturer's information card with you at all times. °· Change your dressing as told by your  health care provider. °· Contact a health care provider if you have a fever or chills or if you have redness, swelling, or pain around your port insertion site. °· Keep all follow-up visits as told by your health care provider. °This information is not intended to replace advice given to you by your health care provider. Make sure you discuss any questions you have with your health care provider. °Document Revised: 12/13/2017 Document Reviewed: 12/13/2017 °Elsevier Patient Education © 2021 Elsevier Inc. ° ° °Moderate Conscious Sedation, Adult, Care After °This sheet gives you information about how to care for yourself after your procedure. Your health care provider may also give you more specific instructions. If you have problems or questions, contact your health care provider. °What can I expect after the procedure? °After the procedure, it is common to have: °· Sleepiness for several hours. °· Impaired judgment for several hours. °· Difficulty with balance. °· Vomiting if you eat too soon. °Follow these instructions at home: °For the time period you were told by your health care provider: °· Rest. °· Do not participate in activities where you could fall or become injured. °· Do not drive or use machinery. °· Do not drink alcohol. °· Do not take sleeping pills or medicines that cause drowsiness. °· Do not make important decisions or sign legal documents. °· Do not take care of children on your own.  °  °  °Eating and drinking °· Follow the diet recommended by your health care provider. °· Drink enough fluid to keep your urine pale yellow. °· If you vomit: °? Drink water, juice, or soup when you can drink without vomiting. °? Make sure you have little or no nausea before eating solid foods.   °General instructions °· Take over-the-counter and prescription medicines only as told by your health care provider. °· Have a responsible adult stay with you for the time you are told. It is important to have someone help  care for you until you are awake and alert. °· Do not smoke. °· Keep all follow-up visits as told by your health care provider. This is important. °Contact a health care provider if: °· You are still sleepy or having trouble with balance after 24 hours. °· You feel light-headed. °· You keep feeling nauseous or you keep vomiting. °· You develop a rash. °· You have a fever. °· You have redness or swelling around the IV site. °Get help right away if: °· You have trouble breathing. °· You have new-onset confusion at home. °Summary °· After the procedure, it is common to feel sleepy, have impaired judgment, or feel nauseous if you eat too soon. °· Rest after you get home. Know the things you should not do after the procedure. °· Follow the diet recommended by your health care provider and drink enough   fluid to keep your urine pale yellow. °· Get help right away if you have trouble breathing or new-onset confusion at home. °This information is not intended to replace advice given to you by your health care provider. Make sure you discuss any questions you have with your health care provider. °Document Revised: 09/14/2019 Document Reviewed: 04/12/2019 °Elsevier Patient Education © 2021 Elsevier Inc. ° ° °

## 2020-10-22 ENCOUNTER — Inpatient Hospital Stay: Payer: BC Managed Care – PPO

## 2020-10-22 ENCOUNTER — Ambulatory Visit (HOSPITAL_BASED_OUTPATIENT_CLINIC_OR_DEPARTMENT_OTHER)
Admission: RE | Admit: 2020-10-22 | Discharge: 2020-10-22 | Disposition: A | Discharge status: Home / Self Care | Payer: BC Managed Care – PPO | Source: Ambulatory Visit | Attending: Hematology & Oncology | Admitting: Hematology & Oncology

## 2020-10-22 ENCOUNTER — Encounter: Payer: Self-pay | Admitting: *Deleted

## 2020-10-22 ENCOUNTER — Encounter (HOSPITAL_BASED_OUTPATIENT_CLINIC_OR_DEPARTMENT_OTHER): Payer: Self-pay

## 2020-10-22 VITALS — BP 131/87 | HR 87 | Temp 98.7°F | Resp 19

## 2020-10-22 DIAGNOSIS — C169 Malignant neoplasm of stomach, unspecified: Secondary | ICD-10-CM | POA: Insufficient documentation

## 2020-10-22 DIAGNOSIS — C160 Malignant neoplasm of cardia: Secondary | ICD-10-CM

## 2020-10-22 LAB — CMP (CANCER CENTER ONLY)
ALT: 16 U/L (ref 0–44)
AST: 18 U/L (ref 15–41)
Albumin: 4 g/dL (ref 3.5–5.0)
Alkaline Phosphatase: 97 U/L (ref 38–126)
Anion gap: 6 (ref 5–15)
BUN: 11 mg/dL (ref 8–23)
CO2: 28 mmol/L (ref 22–32)
Calcium: 9.5 mg/dL (ref 8.9–10.3)
Chloride: 102 mmol/L (ref 98–111)
Creatinine: 0.83 mg/dL (ref 0.61–1.24)
GFR, Estimated: >60 mL/min (ref >60)
Glucose, Bld: 117 mg/dL — ABNORMAL HIGH (ref 70–99)
Potassium: 4 mmol/L (ref 3.5–5.1)
Sodium: 136 mmol/L (ref 135–145)
Total Bilirubin: 0.4 mg/dL (ref 0.3–1.2)
Total Protein: 6.4 g/dL — ABNORMAL LOW (ref 6.5–8.1)

## 2020-10-22 LAB — CBC WITH DIFFERENTIAL (CANCER CENTER ONLY)
Abs Immature Granulocytes: 0.02 10*3/uL (ref 0.00–0.07)
Basophils Absolute: 0 10*3/uL (ref 0.0–0.1)
Basophils Relative: 1 %
Eosinophils Absolute: 0.1 10*3/uL (ref 0.0–0.5)
Eosinophils Relative: 2 %
HCT: 34 % — ABNORMAL LOW (ref 39.0–52.0)
Hemoglobin: 11.3 g/dL — ABNORMAL LOW (ref 13.0–17.0)
Immature Granulocytes: 0 %
Lymphocytes Relative: 14 %
Lymphs Abs: 1 10*3/uL (ref 0.7–4.0)
MCH: 28.9 pg (ref 26.0–34.0)
MCHC: 33.2 g/dL (ref 30.0–36.0)
MCV: 87 fL (ref 80.0–100.0)
Monocytes Absolute: 0.7 10*3/uL (ref 0.1–1.0)
Monocytes Relative: 10 %
Neutro Abs: 5.1 10*3/uL (ref 1.7–7.7)
Neutrophils Relative %: 73 %
Platelet Count: 310 10*3/uL (ref 150–400)
RBC: 3.91 MIL/uL — ABNORMAL LOW (ref 4.22–5.81)
RDW: 13.7 % (ref 11.5–15.5)
WBC Count: 7 10*3/uL (ref 4.0–10.5)
nRBC: 0 % (ref 0.0–0.2)

## 2020-10-22 MED ORDER — SODIUM CHLORIDE 0.9% FLUSH
10.0000 mL | INTRAVENOUS | Status: DC | PRN
Start: 2020-10-22 — End: 2020-10-22
  Filled 2020-10-22: qty 10

## 2020-10-22 MED ORDER — IOHEXOL 300 MG/ML  SOLN
75.0000 mL | Freq: Once | INTRAMUSCULAR | Status: AC | PRN
  Administered 2020-10-22: 75 mL via INTRAVENOUS

## 2020-10-22 MED ORDER — SODIUM CHLORIDE 0.9 % IV SOLN
480.0000 mg | Freq: Once | INTRAVENOUS | Status: AC
  Administered 2020-10-22: 480 mg via INTRAVENOUS
  Filled 2020-10-22: qty 48

## 2020-10-22 MED ORDER — SODIUM CHLORIDE 0.9% FLUSH
10.0000 mL | Freq: Once | INTRAVENOUS | Status: AC
  Administered 2020-10-22: 10 mL via INTRAVENOUS
  Filled 2020-10-22: qty 10

## 2020-10-22 MED ORDER — LEUCOVORIN CALCIUM INJECTION 350 MG
400.0000 mg/m2 | Freq: Once | INTRAVENOUS | Status: AC
  Administered 2020-10-22: 864 mg via INTRAVENOUS
  Filled 2020-10-22: qty 43.2

## 2020-10-22 MED ORDER — OXALIPLATIN CHEMO INJECTION 100 MG/20ML
85.0000 mg/m2 | Freq: Once | INTRAVENOUS | Status: AC
  Administered 2020-10-22: 185 mg via INTRAVENOUS
  Filled 2020-10-22: qty 37

## 2020-10-22 MED ORDER — SODIUM CHLORIDE 0.9 % IV SOLN
2325.0000 mg/m2 | INTRAVENOUS | Status: DC
  Administered 2020-10-22: 5000 mg via INTRAVENOUS
  Filled 2020-10-22: qty 100

## 2020-10-22 MED ORDER — HEPARIN SOD (PORK) LOCK FLUSH 100 UNIT/ML IV SOLN
500.0000 [IU] | Freq: Once | INTRAVENOUS | Status: DC | PRN
  Filled 2020-10-22: qty 5

## 2020-10-22 MED ORDER — DEXTROSE 5 % IV SOLN
Freq: Once | INTRAVENOUS | Status: AC
Start: 2020-10-22 — End: 2020-10-22
  Filled 2020-10-22: qty 250

## 2020-10-22 MED ORDER — DEXAMETHASONE SODIUM PHOSPHATE 100 MG/10ML IJ SOLN
10.0000 mg | Freq: Once | INTRAMUSCULAR | Status: AC
  Administered 2020-10-22: 10 mg via INTRAVENOUS
  Filled 2020-10-22: qty 10

## 2020-10-22 MED ORDER — DEXTROSE 5 % IV SOLN
Freq: Once | INTRAVENOUS | Status: AC
  Filled 2020-10-22: qty 250

## 2020-10-22 MED ORDER — PALONOSETRON HCL INJECTION 0.25 MG/5ML
0.2500 mg | Freq: Once | INTRAVENOUS | Status: AC
Start: 2020-10-22 — End: 2020-10-22
  Administered 2020-10-22: 0.25 mg via INTRAVENOUS

## 2020-10-22 MED ORDER — PALONOSETRON HCL INJECTION 0.25 MG/5ML
INTRAVENOUS | Status: AC
  Filled 2020-10-22: qty 5

## 2020-10-22 MED ORDER — FLUOROURACIL CHEMO INJECTION 2.5 GM/50ML
400.0000 mg/m2 | Freq: Once | INTRAVENOUS | Status: AC
  Administered 2020-10-22: 850 mg via INTRAVENOUS
  Filled 2020-10-22: qty 17

## 2020-10-22 NOTE — Patient Instructions (Signed)
Tunneled Central Venous Catheter Flushing Guide  It is important to flush your tunneled central venous catheter each time you use it, both before and after you use it. Flushing your catheter will help prevent it from clogging. What are the risks? Risks may include:  Infection.  Air getting into the catheter and bloodstream. Supplies needed:  A clean pair of gloves.  A disinfecting wipe. Use an alcohol wipe, chlorhexidine wipe, or iodine wipe as told by your health care provider.  A 10 mL syringe that has been prefilled with saline solution.  An empty 10 mL syringe, if a substance called heparin was injected into your catheter. How to flush your catheter When you flush your catheter, make sure you follow any specific instructions from your health care provider or the manufacturer. These are general guidelines. Flushing your catheter before use If there is heparin in your catheter: 1. Wash your hands with soap and water. 2. Put on gloves. 3. Scrub the injection cap for a minimum of 15 seconds with a disinfecting wipe. 4. Unclamp the catheter. 5. Attach the empty syringe to the injection cap. 6. Pull the syringe plunger back and withdraw 10 mL of blood. 7. Place the syringe into an appropriate waste container. 8. Scrub the injection cap for 15 seconds with a disinfecting wipe. 9. Attach the prefilled syringe to the injection cap. 10. Flush the catheter by pushing the plunger forward until all the liquid from the syringe is in the catheter. 11. Remove the syringe from the injection cap. 12. Clamp the catheter. If there is no heparin in your catheter: 1. Wash your hands with soap and water. 2. Put on gloves. 3. Scrub the injection cap for 15 seconds with a disinfecting wipe. 4. Unclamp the catheter. 5. Attach the prefilled syringe to the injection cap. 6. Flush the catheter by pushing the plunger forward until 5 mL of the liquid from the syringe is in the catheter. 7. Pull back on  the syringe until you see blood in the catheter. 8. If you have been asked to collect any blood, follow your health care provider's instructions. Otherwise, flush the catheter with the rest of the solution from the syringe. 9. Remove the syringe from the injection cap. 10. Clamp the catheter.   Flushing your catheter after use 1. Wash your hands with soap and water. 2. Put on gloves. 3. Scrub the injection cap for 15 seconds with a disinfecting wipe. 4. Unclamp the catheter. 5. Attach the prefilled syringe to the injection cap. 6. Flush the catheter by pushing the plunger forward until all of the liquid from the syringe is in the catheter. 7. Remove the syringe from the injection cap. 8. Clamp the catheter. Problems and solutions  If blood cannot be completely cleared from the injection cap, you may need to have the injection cap replaced.  If the catheter is difficult to flush, use the pulsing method. The pulsing method involves pushing only a few milliliters of solution into the catheter at a time and pausing between pushes.  If you do not see blood in the catheter when you pull back on the syringe, change your body position, such as by raising your arms above your head. Take a deep breath and cough. Then, pull back on the syringe. If you still do not see blood, flush the catheter with a small amount of solution. Then, change positions again and take a breath or cough. Pull back on the syringe again. If you still do not   see blood, finish flushing the catheter and contact your health care provider. Do not use your catheter until your health care provider says it is okay. General tips  Have someone help you flush your catheter, if possible.  Do not force fluid through your catheter.  Do not use a syringe that is larger or smaller than 10 mL. Using a smaller syringe can make the catheter burst.  Do not use your catheter without flushing it first if it has heparin in it. Contact a health  care provider if:  You cannot see any blood in the catheter when you flush it before using it.  Your catheter is difficult to flush. Get help right away if:  You cannot flush the catheter.  The catheter leaks when you flush it or when there is fluid in it.  There are cracks or breaks in the catheter. Summary  It is important to flush your tunneled central venous catheter each time you use it, both before and after you use it.  Scrub the injection cap for 15 seconds with a disinfecting wipe before and after you flush it.  When you flush your catheter, make sure you follow any specific instructions from your health care provider or the manufacturer.  Get help right away if you cannot flush the catheter. This information is not intended to replace advice given to you by your health care provider. Make sure you discuss any questions you have with your health care provider. Document Revised: 07/26/2019 Document Reviewed: 08/02/2018 Elsevier Patient Education  2021 Elsevier Inc.  

## 2020-10-22 NOTE — Progress Notes (Signed)
Clarified order for CT head. Radiology will be able to work patient in prior to his appointment today.   Patient is here for first treatment. Since this navigator was out of the office for his New Patient Appointment, met with patient in the treatment room and gave patient "Your Patient Navigator" handout which explains my role, areas in which I am able to help, and all the contact information for myself and the office. Also gave patient MD and Navigator business card.  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   Oncology Nurse Navigator Documentation  Oncology Nurse Navigator Flowsheets 10/22/2020  Abnormal Finding Date -  Confirmed Diagnosis Date -  Diagnosis Status -  Phase of Treatment Chemo  Chemotherapy Actual Start Date: 10/22/2020  Chemotherapy Expected End Date: 03/25/2021  Navigator Follow Up Date: 11/05/2020  Navigator Follow Up Reason: Follow-up Appointment;Chemotherapy  Navigator Location [facility]  Navigator Encounter Type Treatment;Appt/Treatment Plan Review  Telephone -  Treatment Initiated Date 10/22/2020  Patient Visit Type MedOnc  Treatment Phase First Chemo Tx  Barriers/Navigation Needs Coordination of Care;Education  Education Preparing for Upcoming Surgery/ Treatment  Interventions Education;Psycho-Social Support  Acuity Level 2-Minimal Needs (1-2 Barriers Identified)  Coordination of Care -  Education Method Verbal  Support Groups/Services Friends and Family  Time Spent with Patient 36

## 2020-10-23 ENCOUNTER — Encounter: Payer: Self-pay | Admitting: *Deleted

## 2020-10-23 LAB — T4: T4, Total: 10.7 ug/dL (ref 4.5–12.0)

## 2020-10-23 LAB — TSH: TSH: 0.433 u[IU]/mL (ref 0.320–4.118)

## 2020-10-23 NOTE — Progress Notes (Signed)
Oncology Nurse Navigator Documentation  Oncology Nurse Navigator Flowsheets 10/23/2020  Abnormal Finding Date -  Confirmed Diagnosis Date -  Diagnosis Status -  Phase of Treatment -  Chemotherapy Actual Start Date: -  Chemotherapy Expected End Date: -  Navigator Follow Up Date: 11/05/2020  Navigator Follow Up Reason: Follow-up Appointment;Chemotherapy  Navigator Restaurant manager, [employer] food Encounter Type Scan Review  Telephone -  Treatment Initiated Date -  Patient Visit Type MedOnc  Treatment Phase Active Tx  Barriers/Navigation Needs Coordination of Care;Education  Education -  Interventions None Required  Acuity Level 2-Minimal Needs (1-2 Barriers Identified)  Coordination of Care -  Education Method -  Support Groups/Services Friends and Family  Time Spent with Patient 15

## 2020-10-24 ENCOUNTER — Inpatient Hospital Stay: Payer: BC Managed Care – PPO

## 2020-10-24 ENCOUNTER — Encounter: Payer: Self-pay | Admitting: Family Medicine

## 2020-10-24 ENCOUNTER — Telehealth: Payer: Self-pay | Admitting: Family Medicine

## 2020-10-24 ENCOUNTER — Ambulatory Visit (INDEPENDENT_AMBULATORY_CARE_PROVIDER_SITE_OTHER): Payer: BC Managed Care – PPO | Admitting: Family Medicine

## 2020-10-24 ENCOUNTER — Other Ambulatory Visit: Payer: Self-pay

## 2020-10-24 VITALS — BP 128/71 | HR 99 | Temp 98.5°F | Resp 17

## 2020-10-24 VITALS — BP 138/66 | HR 89 | Ht 72.0 in | Wt 200.0 lb

## 2020-10-24 DIAGNOSIS — R972 Elevated prostate specific antigen [PSA]: Secondary | ICD-10-CM

## 2020-10-24 DIAGNOSIS — E039 Hypothyroidism, unspecified: Secondary | ICD-10-CM | POA: Diagnosis not present

## 2020-10-24 DIAGNOSIS — C160 Malignant neoplasm of cardia: Secondary | ICD-10-CM | POA: Diagnosis not present

## 2020-10-24 DIAGNOSIS — R140 Abdominal distension (gaseous): Secondary | ICD-10-CM | POA: Diagnosis not present

## 2020-10-24 DIAGNOSIS — K5909 Other constipation: Secondary | ICD-10-CM | POA: Diagnosis not present

## 2020-10-24 MED ORDER — SODIUM CHLORIDE 0.9% FLUSH
10.0000 mL | INTRAVENOUS | Status: DC | PRN
  Administered 2020-10-24: 10 mL
  Filled 2020-10-24: qty 10

## 2020-10-24 MED ORDER — HEPARIN SOD (PORK) LOCK FLUSH 100 UNIT/ML IV SOLN
500.0000 [IU] | Freq: Once | INTRAVENOUS | Status: AC | PRN
  Administered 2020-10-24: 500 [IU]
  Filled 2020-10-24: qty 5

## 2020-10-24 MED ORDER — LEVOTHYROXINE SODIUM 100 MCG PO TABS: 100.0000 ug | ORAL_TABLET | Freq: Every day | ORAL | 0 refills | Status: DC

## 2020-10-24 MED ORDER — DICYCLOMINE HCL 10 MG PO CAPS: 10.0000 mg | ORAL_CAPSULE | Freq: Three times a day (TID) | ORAL | 0 refills | Status: DC

## 2020-10-24 NOTE — Assessment & Plan Note (Signed)
Right now he seems to be doing pretty well with the milk of magnesia we discussed maybe even going to every other day since the stools are still very loose and on the intermittent days may be just doing a softener instead of a laxative and seeing if that is a good balance.  In regards to the gas and bloating recommend a trial of a probiotic as below.

## 2020-10-24 NOTE — Assessment & Plan Note (Signed)
Now following with Dr. Abner Greenspan.  Planning for possible biopsy in the next month or 2.

## 2020-10-24 NOTE — Progress Notes (Signed)
Upon removing dressing from patient port-a-cath, dermabond came up from incision site from port placement. Incision looked clean/dry with no signs of infection. Steri strip placed to site where dermabond was removed and pt given steri strips to keep in place until incision completely healed. Pt educated on watching site for signs and symptoms of infection. Pt verbalized understanding and had no further questions.

## 2020-10-24 NOTE — Assessment & Plan Note (Signed)
Decrease levothyroxine to 100 mcg daily.  This will decrease his total weekly dose by 56 mics.  Plan to recheck TSH in 6 to 8 weeks.

## 2020-10-24 NOTE — Progress Notes (Signed)
Established Patient Office Visit  Subjective:  Patient ID: Eddie Hernandez, male    DOB: Aug 17, 1955  Age: 65 y.o. MRN: [id number]  CC:  Chief Complaint  Patient presents with  . Follow-up    HPI [name] presents for change in stools.  The last several times that I saw him he was really battling with significant constipation.  He was initially tried on MiraLAX and then was started on Amitiza.  In the bowel started becoming more regular where he was either constipated and then he would have to take something and then would have diarrhea to the point of almost having an accident.  About 3 days ago he actually started doing milk of magnesia.  He says so far that actually seems to be a pretty good balance.  The stools are still loose but they are not urgent.  So he is able to make it to the bathroom in time.  He just had a lot of gas and belching.  He says that when he usually releases some of the gas then some of the lower pelvic pain and back pain that he has been having actually get significantly better so he really feels like it is just related to the distended bowel.  He says after a bowel movement he also just has a lot of soreness in his lower pelvis that can last an hour or 2 before it goes away.  He also saw urology, Dr. Abner Greenspan, for his elevated PSA they were wanting to do a biopsy they were able to palpate a nodule on the gland.  They put that on hold for maybe a couple of weeks but Dr. Abner Greenspan will be back in touch with him.  In regards to his recent diagnosis of GE junction cancer they are still awaiting the HER2 results.  Recently had his thyroid levels checked.  He is currently taking a whole tab of 125 mics 6 days a week and skips 1 day a week.  Last TSH was a little low at 0.4.  He wants to know if it would be okay to take a multivitamin.  He also wanted to let me know that he did go see the vascular surgeon in [place] he unfortunately did not have a positive experience and so  plans to go back to his vascular surgeon at [place].  Lab Results  Component Value Date   TSH 0.433 10/22/2020     Past Medical History:  Diagnosis Date  . Alcohol abuse   . Bipolar 1 disorder ([place])   . Constipation   . Emphysema of lung ([place])   . 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 GERD (gastroesophageal reflux disease)   . Hyperlipidemia   . Hypertension   . Hypothyroidism   . Iron deficiency anemia due to chronic blood loss 10/15/2020  . PAD (peripheral artery disease) ([place])   . Peripheral neuropathy    small fiber  . Prostate pain   . Stroke [facility])    Per CT scan - Old - pt was not aware   . Tremor     Past Surgical History:  Procedure Laterality Date  . brain stimulator removed    . COLONOSCOPY    . DEEP BRAIN STIMULATOR PLACEMENT  01-30-08   tremors  . ILIAC ARTERY STENT     x2  . IR IMAGING GUIDED PORT INSERTION  10/21/2020    Family History  Problem Relation Age of Onset  . Stroke Father   .  Alcoholism Father   . Cancer Sister        Lung   . Colon cancer Neg Hx   . Colon polyps Neg Hx   . Esophageal cancer Neg Hx   . Rectal cancer Neg Hx   . Stomach cancer Neg Hx     Social History   Socioeconomic History  . Marital status: Married    Spouse name: Maudry Mayhew   . Number of children: Not on file  . Years of education: Not on file  . Highest education level: Not on file  Occupational History  . Occupation: works in Scientist, research (medical).      Comment: [name]  Tobacco Use  . Smoking status: Former Smoker    Packs/day: 1.00    Years: 45.00    Pack years: 45.00    Types: Cigarettes    Quit date: 09/08/2017    Years since quitting: 3.1  . Smokeless tobacco: Never Used  . Tobacco comment: Pt does vape currently   Vaping Use  . Vaping Use: Every day  . Substances: Nicotine  Substance and Sexual Activity  . Alcohol use: No    Alcohol/week: 0.0 standard drinks    Comment: hx of EtOH abuse  . Drug use: No  . Sexual activity: Not on file  Other Topics  Concern  . 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 No active exercise.    Social Determinants of Health   Financial Resource Strain: Not on file  Food Insecurity: Not on file  Transportation Needs: Not on file  Physical Activity: Not on file  Stress: Not on file  Social Connections: Not on file  Intimate Partner Violence: Not on file    Outpatient Medications Prior to Visit  Medication Sig Dispense Refill  . acetaminophen (TYLENOL) 500 MG tablet Take 1,000 mg by mouth every 6 (six) hours as needed.    . ARIPiprazole (ABILIFY) 5 MG tablet TAKE ONE TABLET (5 MG TOTAL) BY MOUTH DAILY 30 tablet 3  . atorvastatin (LIPITOR) 20 MG tablet Take 40 mg by mouth at bedtime.    . cilostazol (PLETAL) 100 MG tablet Take 100 mg by mouth 2 (two) times daily.    . DULoxetine (CYMBALTA) 30 MG capsule Take 30 mg by mouth 2 (two) times daily.     Marland Kitchen lisinopril (ZESTRIL) 20 MG tablet Take 20 mg by mouth daily.    Marland Kitchen lubiprostone (AMITIZA) 8 MCG capsule Take 1 capsule (8 mcg total) by mouth 2 (two) times daily with a meal. (Patient taking differently: Take 8 mcg by mouth 2 (two) times daily with a meal. 24 once, 8 bid) 60 capsule 3  . mirtazapine (REMERON SOL-TAB) 15 MG disintegrating tablet Take 15 mg by mouth daily.    Marland Kitchen NUVIGIL 250 MG tablet Take 250 mg by mouth daily.    . ondansetron (ZOFRAN) 8 MG tablet Take 1 tablet (8 mg total) by mouth 2 (two) times daily as needed for refractory nausea / vomiting. Start on day 3 after chemotherapy. 30 tablet 1  . pantoprazole (PROTONIX) 40 MG tablet Take 1 tablet by mouth daily.    . pregabalin (LYRICA) 200 MG capsule Take 1 capsule by mouth 3 (three) times daily.    . SODIUM FLUORIDE 5000 PPM 1.1 % PSTE SMARTSIG:Sparingly Topical Every Night    . traMADol (ULTRAM) 50 MG tablet Take 1 tablet (50 mg total) by mouth every 8 (eight) hours as needed. [place]  tablet 0  . dicyclomine (BENTYL) 10 MG capsule Take 1 capsule (10 mg total) by  mouth 3 (three) times daily before meals. 90 capsule 0  . levothyroxine (SYNTHROID) 125 MCG tablet TAKE 1 TABLET BY MOUTH 6 DAYS A WEEK, AND 1/2 TABLET 1 DAY. 135 tablet 1  . dexamethasone (DECADRON) 4 MG tablet Take 2 tablets (8 mg total) by mouth daily. Start the day after chemotherapy for 2 days. Take with food. (Patient not taking: Reported on 10/24/2020) 30 tablet 1  . lidocaine-prilocaine (EMLA) cream Apply to affected area once 30 g 3  . Multiple Vitamins-Minerals (MULTIVITAMIN MEN 50+) TABS Take 1 tablet by mouth daily. (Patient not taking: No sig reported)    . pentoxifylline (TRENTAL) 400 MG CR tablet     . prochlorperazine (COMPAZINE) 10 MG tablet Take 1 tablet (10 mg total) by mouth every 6 (six) hours as needed (Nausea or vomiting). 30 tablet 1  . protriptyline (VIVACTIL) 10 MG tablet Take 10 mg by mouth 2 (two) times daily.     No facility-administered medications prior to visit.    No Known Allergies  ROS Review of Systems    Objective:    Physical Exam Vitals reviewed.  Constitutional:      Appearance: He is well-developed.  HENT:     Head: Normocephalic and atraumatic.  Eyes:     Conjunctiva/sclera: Conjunctivae normal.  Cardiovascular:     Rate and Rhythm: Normal rate.  Pulmonary:     Effort: Pulmonary effort is normal.  Skin:    General: Skin is dry.     Coloration: Skin is not pale.  Neurological:     Mental Status: He is alert and oriented to person, place, and time.  Psychiatric:        Behavior: Behavior normal.     BP 138/66   Pulse 89   Ht 6' (1.829 m)   Wt 200 lb (90.7 kg)   SpO2 97%   BMI 27.12 kg/m  Wt Readings from Last 3 Encounters:  10/24/20 200 lb (90.7 kg)  10/14/20 203 lb (92.1 kg)  10/09/20 202 lb (91.6 kg)     Health Maintenance Due  Topic Date Due  . COVID-19 Vaccine (4 - Booster for Moderna series) 07/31/2020  . Zoster Vaccines- Shingrix (2 of 2) 09/18/2020    There are no preventive care reminders to display for this  patient.  Lab Results  Component Value Date   TSH 0.433 10/22/2020   Lab Results  Component Value Date   WBC 7.0 10/22/2020   HGB 11.3 (L) 10/22/2020   HCT 34.0 (L) 10/22/2020   MCV 87.0 10/22/2020   PLT 310 10/22/2020   Lab Results  Component Value Date   NA 136 10/22/2020   K 4.0 10/22/2020   CO2 28 10/22/2020   GLUCOSE 117 (H) 10/22/2020   BUN 11 10/22/2020   CREATININE 0.83 10/22/2020   BILITOT 0.4 10/22/2020   ALKPHOS 97 10/22/2020   AST 18 10/22/2020   ALT 16 10/22/2020   PROT 6.4 (L) 10/22/2020   ALBUMIN 4.0 10/22/2020   CALCIUM 9.5 10/22/2020   ANIONGAP 6 10/22/2020   Lab Results  Component Value Date   CHOL 111 03/05/2020   Lab Results  Component Value Date   HDL 44 03/05/2020   Lab Results  Component Value Date   LDLCALC 50 03/05/2020   Lab Results  Component Value Date   TRIG 91 03/05/2020   Lab Results  Component Value Date  CHOLHDL 2.5 03/05/2020   Lab Results  Component Value Date   HGBA1C 6.1 (A) 07/24/2020      Assessment & Plan:   Problem List Items Addressed This Visit      Digestive   Chronic constipation    Right now he seems to be doing pretty well with the milk of magnesia we discussed maybe even going to every other day since the stools are still very loose and on the intermittent days may be just doing a softener instead of a laxative and seeing if that is a good balance.  In regards to the gas and bloating recommend a trial of a probiotic as below.        Endocrine   Hypothyroidism    Decrease levothyroxine to 100 mcg daily.  This will decrease his total weekly dose by 56 mics.  Plan to recheck TSH in 6 to 8 weeks.      Relevant Medications   levothyroxine (SYNTHROID) 100 MCG tablet     Other   Elevated PSA    Now following with Dr. Abner Greenspan.  Planning for possible biopsy in the next month or 2.       Other Visit Diagnoses    Bloating    -  Primary      Bloating-I think he would be benefit from a trial of a  probiotic.  I think this could actually be really helpful.  He is already tried Gas-X and its not been helpful.  His bowels are actually new moving now.  We did discuss that constipation can increase gas.  Also encouraged him to look to see if there are any specific food triggers.  He did notice that pizza the other night made it worse.  Meds ordered this encounter  Medications  . dicyclomine (BENTYL) 10 MG capsule    Sig: Take 1 capsule (10 mg total) by mouth 3 (three) times daily before meals.    Dispense:  90 capsule    Refill:  0  . levothyroxine (SYNTHROID) 100 MCG tablet    Sig: Take 1 tablet (100 mcg total) by mouth daily before breakfast.    Dispense:  90 tablet    Refill:  0    Follow-up: Return in about 3 months (around 01/24/2021).    Beatrice Lecher, MD

## 2020-10-24 NOTE — Telephone Encounter (Signed)
Please see MyChart note sent to patient.   @TODAY @

## 2020-11-03 ENCOUNTER — Encounter: Payer: Self-pay | Admitting: Family Medicine

## 2020-11-05 ENCOUNTER — Telehealth: Payer: Self-pay

## 2020-11-05 ENCOUNTER — Inpatient Hospital Stay: Payer: BC Managed Care – PPO

## 2020-11-05 ENCOUNTER — Inpatient Hospital Stay (HOSPITAL_BASED_OUTPATIENT_CLINIC_OR_DEPARTMENT_OTHER): Payer: BC Managed Care – PPO | Admitting: Hematology & Oncology

## 2020-11-05 ENCOUNTER — Encounter: Payer: Self-pay | Admitting: Hematology & Oncology

## 2020-11-05 ENCOUNTER — Encounter: Payer: Self-pay | Admitting: *Deleted

## 2020-11-05 ENCOUNTER — Other Ambulatory Visit: Payer: Self-pay

## 2020-11-05 ENCOUNTER — Inpatient Hospital Stay: Payer: BC Managed Care – PPO | Attending: Hematology & Oncology

## 2020-11-05 VITALS — BP 130/80 | HR 82 | Temp 98.1°F | Resp 18 | Ht 72.0 in | Wt 199.0 lb

## 2020-11-05 DIAGNOSIS — Z5111 Encounter for antineoplastic chemotherapy: Secondary | ICD-10-CM | POA: Insufficient documentation

## 2020-11-05 DIAGNOSIS — C160 Malignant neoplasm of cardia: Secondary | ICD-10-CM

## 2020-11-05 DIAGNOSIS — Z5112 Encounter for antineoplastic immunotherapy: Secondary | ICD-10-CM | POA: Insufficient documentation

## 2020-11-05 DIAGNOSIS — Z79899 Other long term (current) drug therapy: Secondary | ICD-10-CM | POA: Diagnosis not present

## 2020-11-05 LAB — CBC WITH DIFFERENTIAL (CANCER CENTER ONLY)
Abs Immature Granulocytes: 0.01 10*3/uL (ref 0.00–0.07)
Basophils Absolute: 0 10*3/uL (ref 0.0–0.1)
Basophils Relative: 1 %
Eosinophils Absolute: 0.2 10*3/uL (ref 0.0–0.5)
Eosinophils Relative: 4 %
HCT: 28.8 % — ABNORMAL LOW (ref 39.0–52.0)
Hemoglobin: 9.4 g/dL — ABNORMAL LOW (ref 13.0–17.0)
Immature Granulocytes: 0 %
Lymphocytes Relative: 19 %
Lymphs Abs: 0.7 10*3/uL (ref 0.7–4.0)
MCH: 28.7 pg (ref 26.0–34.0)
MCHC: 32.6 g/dL (ref 30.0–36.0)
MCV: 88.1 fL (ref 80.0–100.0)
Monocytes Absolute: 0.4 10*3/uL (ref 0.1–1.0)
Monocytes Relative: 11 %
Neutro Abs: 2.5 10*3/uL (ref 1.7–7.7)
Neutrophils Relative %: 65 %
Platelet Count: 181 10*3/uL (ref 150–400)
RBC: 3.27 MIL/uL — ABNORMAL LOW (ref 4.22–5.81)
RDW: 14 % (ref 11.5–15.5)
WBC Count: 3.8 10*3/uL — ABNORMAL LOW (ref 4.0–10.5)
nRBC: 0 % (ref 0.0–0.2)

## 2020-11-05 LAB — CMP (CANCER CENTER ONLY)
ALT: 19 U/L (ref 0–44)
AST: 22 U/L (ref 15–41)
Albumin: 3.7 g/dL (ref 3.5–5.0)
Alkaline Phosphatase: 91 U/L (ref 38–126)
Anion gap: 5 (ref 5–15)
BUN: 12 mg/dL (ref 8–23)
CO2: 28 mmol/L (ref 22–32)
Calcium: 9 mg/dL (ref 8.9–10.3)
Chloride: 107 mmol/L (ref 98–111)
Creatinine: 0.83 mg/dL (ref 0.61–1.24)
GFR, Estimated: >60 mL/min (ref >60)
Glucose, Bld: 154 mg/dL — ABNORMAL HIGH (ref 70–99)
Potassium: 3.7 mmol/L (ref 3.5–5.1)
Sodium: 140 mmol/L (ref 135–145)
Total Bilirubin: 0.2 mg/dL — ABNORMAL LOW (ref 0.3–1.2)
Total Protein: 5.6 g/dL — ABNORMAL LOW (ref 6.5–8.1)

## 2020-11-05 LAB — TSH: TSH: 4.431 u[IU]/mL — ABNORMAL HIGH (ref 0.320–4.118)

## 2020-11-05 LAB — LACTATE DEHYDROGENASE: LDH: 152 U/L (ref 98–192)

## 2020-11-05 MED ORDER — FLUOROURACIL CHEMO INJECTION 2.5 GM/50ML
400.0000 mg/m2 | Freq: Once | INTRAVENOUS | Status: AC
  Administered 2020-11-05: 850 mg via INTRAVENOUS
  Filled 2020-11-05: qty 17

## 2020-11-05 MED ORDER — PALONOSETRON HCL INJECTION 0.25 MG/5ML
0.2500 mg | Freq: Once | INTRAVENOUS | Status: AC
Start: 2020-11-05 — End: 2020-11-05
  Administered 2020-11-05: 0.25 mg via INTRAVENOUS

## 2020-11-05 MED ORDER — LEUCOVORIN CALCIUM INJECTION 350 MG
400.0000 mg/m2 | Freq: Once | INTRAVENOUS | Status: AC
  Administered 2020-11-05: 864 mg via INTRAVENOUS
  Filled 2020-11-05: qty 43.2

## 2020-11-05 MED ORDER — DEXTROSE 5 % IV SOLN
Freq: Once | INTRAVENOUS | Status: AC
Start: 2020-11-05 — End: 2020-11-05
  Filled 2020-11-05: qty 250

## 2020-11-05 MED ORDER — SODIUM CHLORIDE 0.9 % IV SOLN
2325.0000 mg/m2 | INTRAVENOUS | Status: DC
  Administered 2020-11-05: 5000 mg via INTRAVENOUS
  Filled 2020-11-05: qty 100

## 2020-11-05 MED ORDER — SODIUM CHLORIDE 0.9 % IV SOLN
10.0000 mg | Freq: Once | INTRAVENOUS | Status: AC
  Administered 2020-11-05: 10 mg via INTRAVENOUS
  Filled 2020-11-05: qty 10

## 2020-11-05 MED ORDER — PALONOSETRON HCL INJECTION 0.25 MG/5ML
INTRAVENOUS | Status: AC
  Filled 2020-11-05: qty 5

## 2020-11-05 MED ORDER — OXALIPLATIN CHEMO INJECTION 100 MG/20ML
85.0000 mg/m2 | Freq: Once | INTRAVENOUS | Status: AC
  Administered 2020-11-05: 185 mg via INTRAVENOUS
  Filled 2020-11-05: qty 37

## 2020-11-05 NOTE — Patient Instructions (Signed)
Tunneled Central Venous Catheter Flushing Guide  It is important to flush your tunneled central venous catheter each time you use it, both before and after you use it. Flushing your catheter will help prevent it from clogging. What are the risks? Risks may include:  Infection.  Air getting into the catheter and bloodstream. Supplies needed:  A clean pair of gloves.  A disinfecting wipe. Use an alcohol wipe, chlorhexidine wipe, or iodine wipe as told by your health care provider.  A 10 mL syringe that has been prefilled with saline solution.  An empty 10 mL syringe, if a substance called heparin was injected into your catheter. How to flush your catheter When you flush your catheter, make sure you follow any specific instructions from your health care provider or the manufacturer. These are general guidelines. Flushing your catheter before use If there is heparin in your catheter: 1. Wash your hands with soap and water. 2. Put on gloves. 3. Scrub the injection cap for a minimum of 15 seconds with a disinfecting wipe. 4. Unclamp the catheter. 5. Attach the empty syringe to the injection cap. 6. Pull the syringe plunger back and withdraw 10 mL of blood. 7. Place the syringe into an appropriate waste container. 8. Scrub the injection cap for 15 seconds with a disinfecting wipe. 9. Attach the prefilled syringe to the injection cap. 10. Flush the catheter by pushing the plunger forward until all the liquid from the syringe is in the catheter. 11. Remove the syringe from the injection cap. 12. Clamp the catheter. If there is no heparin in your catheter: 1. Wash your hands with soap and water. 2. Put on gloves. 3. Scrub the injection cap for 15 seconds with a disinfecting wipe. 4. Unclamp the catheter. 5. Attach the prefilled syringe to the injection cap. 6. Flush the catheter by pushing the plunger forward until 5 mL of the liquid from the syringe is in the catheter. 7. Pull back on  the syringe until you see blood in the catheter. 8. If you have been asked to collect any blood, follow your health care provider's instructions. Otherwise, flush the catheter with the rest of the solution from the syringe. 9. Remove the syringe from the injection cap. 10. Clamp the catheter.   Flushing your catheter after use 1. Wash your hands with soap and water. 2. Put on gloves. 3. Scrub the injection cap for 15 seconds with a disinfecting wipe. 4. Unclamp the catheter. 5. Attach the prefilled syringe to the injection cap. 6. Flush the catheter by pushing the plunger forward until all of the liquid from the syringe is in the catheter. 7. Remove the syringe from the injection cap. 8. Clamp the catheter. Problems and solutions  If blood cannot be completely cleared from the injection cap, you may need to have the injection cap replaced.  If the catheter is difficult to flush, use the pulsing method. The pulsing method involves pushing only a few milliliters of solution into the catheter at a time and pausing between pushes.  If you do not see blood in the catheter when you pull back on the syringe, change your body position, such as by raising your arms above your head. Take a deep breath and cough. Then, pull back on the syringe. If you still do not see blood, flush the catheter with a small amount of solution. Then, change positions again and take a breath or cough. Pull back on the syringe again. If you still do not   see blood, finish flushing the catheter and contact your health care provider. Do not use your catheter until your health care provider says it is okay. General tips  Have someone help you flush your catheter, if possible.  Do not force fluid through your catheter.  Do not use a syringe that is larger or smaller than 10 mL. Using a smaller syringe can make the catheter burst.  Do not use your catheter without flushing it first if it has heparin in it. Contact a health  care provider if:  You cannot see any blood in the catheter when you flush it before using it.  Your catheter is difficult to flush. Get help right away if:  You cannot flush the catheter.  The catheter leaks when you flush it or when there is fluid in it.  There are cracks or breaks in the catheter. Summary  It is important to flush your tunneled central venous catheter each time you use it, both before and after you use it.  Scrub the injection cap for 15 seconds with a disinfecting wipe before and after you flush it.  When you flush your catheter, make sure you follow any specific instructions from your health care provider or the manufacturer.  Get help right away if you cannot flush the catheter. This information is not intended to replace advice given to you by your health care provider. Make sure you discuss any questions you have with your health care provider. Document Revised: 07/26/2019 Document Reviewed: 08/02/2018 Elsevier Patient Education  2021 Elsevier Inc.  

## 2020-11-05 NOTE — Addendum Note (Signed)
Addended by: Burney Gauze R on: 11/05/2020 05:15 PM   Modules accepted: Orders

## 2020-11-05 NOTE — Patient Instructions (Signed)
Oxaliplatin Injection What is this medicine? OXALIPLATIN (ox AL i PLA tin) is a chemotherapy drug. It targets fast dividing cells, like cancer cells, and causes these cells to die. This medicine is used to treat cancers of the colon and rectum, and many other cancers. This medicine may be used for other purposes; ask your health care provider or pharmacist if you have questions. COMMON BRAND NAME(S): Eloxatin What should I tell my health care provider before I take this medicine? They need to know if you have any of these conditions:  heart disease  history of irregular heartbeat  liver disease  low blood counts, like white cells, platelets, or red blood cells  lung or breathing disease, like asthma  take medicines that treat or prevent blood clots  tingling of the fingers or toes, or other nerve disorder  an unusual or allergic reaction to oxaliplatin, other chemotherapy, other medicines, foods, dyes, or preservatives  pregnant or trying to get pregnant  breast-feeding How should I use this medicine? This drug is given as an infusion into a vein. It is administered in a hospital or clinic by a specially trained health care professional. Talk to your pediatrician regarding the use of this medicine in children. Special care may be needed. Overdosage: If you think you have taken too much of this medicine contact a poison control center or emergency room at once. NOTE: This medicine is only for you. Do not share this medicine with others. What if I miss a dose? It is important not to miss a dose. Call your doctor or health care professional if you are unable to keep an appointment. What may interact with this medicine? Do not take this medicine with any of the following medications:  cisapride  dronedarone  pimozide  thioridazine This medicine may also interact with the following medications:  aspirin and aspirin-like medicines  certain medicines that treat or prevent  blood clots like warfarin, apixaban, dabigatran, and rivaroxaban  cisplatin  cyclosporine  diuretics  medicines for infection like acyclovir, adefovir, amphotericin B, bacitracin, cidofovir, foscarnet, ganciclovir, gentamicin, pentamidine, vancomycin  NSAIDs, medicines for pain and inflammation, like ibuprofen or naproxen  other medicines that prolong the QT interval (an abnormal heart rhythm)  pamidronate  zoledronic acid This list may not describe all possible interactions. Give your health care provider a list of all the medicines, herbs, non-prescription drugs, or dietary supplements you use. Also tell them if you smoke, drink alcohol, or use illegal drugs. Some items may interact with your medicine. What should I watch for while using this medicine? Your condition will be monitored carefully while you are receiving this medicine. You may need blood work done while you are taking this medicine. This medicine may make you feel generally unwell. This is not uncommon as chemotherapy can affect healthy cells as well as cancer cells. Report any side effects. Continue your course of treatment even though you feel ill unless your healthcare professional tells you to stop. This medicine can make you more sensitive to cold. Do not drink cold drinks or use ice. Cover exposed skin before coming in contact with cold temperatures or cold objects. When out in cold weather wear warm clothing and cover your mouth and nose to warm the air that goes into your lungs. Tell your doctor if you get sensitive to the cold. Do not become pregnant while taking this medicine or for 9 months after stopping it. Women should inform their health care professional if they wish to become   pregnant or think they might be pregnant. Men should not father a child while taking this medicine and for 6 months after stopping it. There is potential for serious side effects to an unborn child. Talk to your health care professional  for more information. Do not breast-feed a child while taking this medicine or for 3 months after stopping it. This medicine has caused ovarian failure in some women. This medicine may make it more difficult to get pregnant. Talk to your health care professional if you are concerned about your fertility. This medicine has caused decreased sperm counts in some men. This may make it more difficult to father a child. Talk to your health care professional if you are concerned about your fertility. This medicine may increase your risk of getting an infection. Call your health care professional for advice if you get a fever, chills, or sore throat, or other symptoms of a cold or flu. Do not treat yourself. Try to avoid being around people who are sick. Avoid taking medicines that contain aspirin, acetaminophen, ibuprofen, naproxen, or ketoprofen unless instructed by your health care professional. These medicines may hide a fever. Be careful brushing or flossing your teeth or using a toothpick because you may get an infection or bleed more easily. If you have any dental work done, tell your dentist you are receiving this medicine. What side effects may I notice from receiving this medicine? Side effects that you should report to your doctor or health care professional as soon as possible:  allergic reactions like skin rash, itching or hives, swelling of the face, lips, or tongue  breathing problems  cough  low blood counts - this medicine may decrease the number of white blood cells, red blood cells, and platelets. You may be at increased risk for infections and bleeding  nausea, vomiting  pain, redness, or irritation at site where injected  pain, tingling, numbness in the hands or feet  signs and symptoms of bleeding such as bloody or black, tarry stools; red or dark brown urine; spitting up blood or brown material that looks like coffee grounds; red spots on the skin; unusual bruising or bleeding  from the eyes, gums, or nose  signs and symptoms of a dangerous change in heartbeat or heart rhythm like chest pain; dizziness; fast, irregular heartbeat; palpitations; feeling faint or lightheaded; falls  signs and symptoms of infection like fever; chills; cough; sore throat; pain or trouble passing urine  signs and symptoms of liver injury like dark yellow or brown urine; general ill feeling or flu-like symptoms; light-colored stools; loss of appetite; nausea; right upper belly pain; unusually weak or tired; yellowing of the eyes or skin  signs and symptoms of low red blood cells or anemia such as unusually weak or tired; feeling faint or lightheaded; falls  signs and symptoms of muscle injury like dark urine; trouble passing urine or change in the amount of urine; unusually weak or tired; muscle pain; back pain Side effects that usually do not require medical attention (report to your doctor or health care professional if they continue or are bothersome):  changes in taste  diarrhea  gas  hair loss  loss of appetite  mouth sores This list may not describe all possible side effects. Call your doctor for medical advice about side effects. You may report side effects to FDA at 1-800-FDA-1088. Where should I keep my medicine? This drug is given in a hospital or clinic and will not be stored at home. NOTE:   This sheet is a summary. It may not cover all possible information. If you have questions about this medicine, talk to your doctor, pharmacist, or health care provider.  2021 Elsevier/Gold Standard (2018-10-04 12:20:35) Fluorouracil, 5-FU injection What is this medicine? FLUOROURACIL, 5-FU (flure oh YOOR a sil) is a chemotherapy drug. It slows the growth of cancer cells. This medicine is used to treat many types of cancer like breast cancer, colon or rectal cancer, pancreatic cancer, and stomach cancer. This medicine may be used for other purposes; ask your health care provider or  pharmacist if you have questions. COMMON BRAND NAME(S): Adrucil What should I tell my health care provider before I take this medicine? They need to know if you have any of these conditions:  blood disorders  dihydropyrimidine dehydrogenase (DPD) deficiency  infection (especially a virus infection such as chickenpox, cold sores, or herpes)  kidney disease  liver disease  malnourished, poor nutrition  recent or ongoing radiation therapy  an unusual or allergic reaction to fluorouracil, other chemotherapy, other medicines, foods, dyes, or preservatives  pregnant or trying to get pregnant  breast-feeding How should I use this medicine? This drug is given as an infusion or injection into a vein. It is administered in a hospital or clinic by a specially trained health care professional. Talk to your pediatrician regarding the use of this medicine in children. Special care may be needed. Overdosage: If you think you have taken too much of this medicine contact a poison control center or emergency room at once. NOTE: This medicine is only for you. Do not share this medicine with others. What if I miss a dose? It is important not to miss your dose. Call your doctor or health care professional if you are unable to keep an appointment. What may interact with this medicine? Do not take this medicine with any of the following medications:  live virus vaccines This medicine may also interact with the following medications:  medicines that treat or prevent blood clots like warfarin, enoxaparin, and dalteparin This list may not describe all possible interactions. Give your health care provider a list of all the medicines, herbs, non-prescription drugs, or dietary supplements you use. Also tell them if you smoke, drink alcohol, or use illegal drugs. Some items may interact with your medicine. What should I watch for while using this medicine? Visit your doctor for checks on your progress.  This drug may make you feel generally unwell. This is not uncommon, as chemotherapy can affect healthy cells as well as cancer cells. Report any side effects. Continue your course of treatment even though you feel ill unless your doctor tells you to stop. In some cases, you may be given additional medicines to help with side effects. Follow all directions for their use. Call your doctor or health care professional for advice if you get a fever, chills or sore throat, or other symptoms of a cold or flu. Do not treat yourself. This drug decreases your body's ability to fight infections. Try to avoid being around people who are sick. This medicine may increase your risk to bruise or bleed. Call your doctor or health care professional if you notice any unusual bleeding. Be careful brushing and flossing your teeth or using a toothpick because you may get an infection or bleed more easily. If you have any dental work done, tell your dentist you are receiving this medicine. Avoid taking products that contain aspirin, acetaminophen, ibuprofen, naproxen, or ketoprofen unless instructed by your doctor.  These medicines may hide a fever. Do not become pregnant while taking this medicine. Women should inform their doctor if they wish to become pregnant or think they might be pregnant. There is a potential for serious side effects to an unborn child. Talk to your health care professional or pharmacist for more information. Do not breast-feed an infant while taking this medicine. Men should inform their doctor if they wish to father a child. This medicine may lower sperm counts. Do not treat diarrhea with over the counter products. Contact your doctor if you have diarrhea that lasts more than 2 days or if it is severe and watery. This medicine can make you more sensitive to the sun. Keep out of the sun. If you cannot avoid being in the sun, wear protective clothing and use sunscreen. Do not use sun lamps or tanning  beds/booths. What side effects may I notice from receiving this medicine? Side effects that you should report to your doctor or health care professional as soon as possible:  allergic reactions like skin rash, itching or hives, swelling of the face, lips, or tongue  low blood counts - this medicine may decrease the number of white blood cells, red blood cells and platelets. You may be at increased risk for infections and bleeding.  signs of infection - fever or chills, cough, sore throat, pain or difficulty passing urine  signs of decreased platelets or bleeding - bruising, pinpoint red spots on the skin, black, tarry stools, blood in the urine  signs of decreased red blood cells - unusually weak or tired, fainting spells, lightheadedness  breathing problems  changes in vision  chest pain  mouth sores  nausea and vomiting  pain, swelling, redness at site where injected  pain, tingling, numbness in the hands or feet  redness, swelling, or sores on hands or feet  stomach pain  unusual bleeding Side effects that usually do not require medical attention (report to your doctor or health care professional if they continue or are bothersome):  changes in finger or toe nails  diarrhea  dry or itchy skin  hair loss  headache  loss of appetite  sensitivity of eyes to the light  stomach upset  unusually teary eyes This list may not describe all possible side effects. Call your doctor for medical advice about side effects. You may report side effects to FDA at 1-800-FDA-1088. Where should I keep my medicine? This drug is given in a hospital or clinic and will not be stored at home. NOTE: This sheet is a summary. It may not cover all possible information. If you have questions about this medicine, talk to your doctor, pharmacist, or health care provider.  2021 Elsevier/Gold Standard (2019-04-17 15:00:03) Leucovorin injection What is this medicine? LEUCOVORIN (loo koe  VOR in) is used to prevent or treat the harmful effects of some medicines. This medicine is used to treat anemia caused by a low amount of folic acid in the body. It is also used with 5-fluorouracil (5-FU) to treat colon cancer. This medicine may be used for other purposes; ask your health care provider or pharmacist if you have questions. What should I tell my health care provider before I take this medicine? They need to know if you have any of these conditions:  anemia from low levels of vitamin B-12 in the blood  an unusual or allergic reaction to leucovorin, folic acid, other medicines, foods, dyes, or preservatives  pregnant or trying to get pregnant  breast-feeding How  should I use this medicine? This medicine is for injection into a muscle or into a vein. It is given by a health care professional in a hospital or clinic setting. Talk to your pediatrician regarding the use of this medicine in children. Special care may be needed. Overdosage: If you think you have taken too much of this medicine contact a poison control center or emergency room at once. NOTE: This medicine is only for you. Do not share this medicine with others. What if I miss a dose? This does not apply. What may interact with this medicine?  capecitabine  fluorouracil  phenobarbital  phenytoin  primidone  trimethoprim-sulfamethoxazole This list may not describe all possible interactions. Give your health care provider a list of all the medicines, herbs, non-prescription drugs, or dietary supplements you use. Also tell them if you smoke, drink alcohol, or use illegal drugs. Some items may interact with your medicine. What should I watch for while using this medicine? Your condition will be monitored carefully while you are receiving this medicine. This medicine may increase the side effects of 5-fluorouracil, 5-FU. Tell your doctor or health care professional if you have diarrhea or mouth sores that do not  get better or that get worse. What side effects may I notice from receiving this medicine? Side effects that you should report to your doctor or health care professional as soon as possible:  allergic reactions like skin rash, itching or hives, swelling of the face, lips, or tongue  breathing problems  fever, infection  mouth sores  unusual bleeding or bruising  unusually weak or tired Side effects that usually do not require medical attention (report to your doctor or health care professional if they continue or are bothersome):  constipation or diarrhea  loss of appetite  nausea, vomiting This list may not describe all possible side effects. Call your doctor for medical advice about side effects. You may report side effects to FDA at 1-800-FDA-1088. Where should I keep my medicine? This drug is given in a hospital or clinic and will not be stored at home. NOTE: This sheet is a summary. It may not cover all possible information. If you have questions about this medicine, talk to your doctor, pharmacist, or health care provider.  2021 Elsevier/Gold Standard (2007-11-21 16:50:29)

## 2020-11-05 NOTE — Progress Notes (Signed)
Patient will have Herceptin added to his treatment plan at the next cycle. Patient will also need baseline ECHO prior to initiation. Order has not yet been placed. Message sent to provider to enter order.  Oncology Nurse Navigator Documentation  Oncology Nurse Navigator Flowsheets 11/05/2020  Abnormal Finding Date -  Confirmed Diagnosis Date -  Diagnosis Status -  Phase of Treatment -  Chemotherapy Actual Start Date: -  Chemotherapy Expected End Date: -  Navigator Follow Up Date: 11/18/2020  Navigator Follow Up Reason: Follow-up After Biopsy;Chemotherapy  Navigator [employer], [employer] Encounter Type Appt/Treatment Plan Review  Telephone -  Treatment Initiated Date -  Patient Visit Type MedOnc  Treatment Phase Active Tx  Barriers/Navigation Needs Coordination of Care;Education  Education -  Interventions Coordination of Care  Acuity Level 2-Minimal Needs (1-2 Barriers Identified)  Coordination of Care Other  Education Method -  Support Groups/Services Friends and Family  Time Spent with Patient 30

## 2020-11-05 NOTE — Progress Notes (Signed)
Hematology and Oncology Follow Up Visit  Eddie Hernandez [id number] Sep 20, 1955 65 y.o. 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 Eddie Hernandez is back for treatment.  We finally get the molecular markers back.  Thankfully, his tumor is HER2 positive.  As such, we will add Herceptin and to the protocol.  We applied at this with her his third cycle.  I will have to get an echocardiogram on him.  He has had no problems with swallowing.  There is no obvious melena or bright red blood per rectum.  His hemoglobin is little bit lower.  He did get some IV iron.  I am surprised that his hemoglobin is little bit lower.  He is eating better.  He has had no dysphagia or odynophagia.  There is no bony pain.  He has had no headache.  He has had no leg swelling.  Overall, I would have to say his performance status is ECOG 1.  Medications:  Current Outpatient Medications:  .  acetaminophen (TYLENOL) 500 MG tablet, Take 1,000 mg by mouth every 6 (six) hours as needed., Disp: , Rfl:  .  ARIPiprazole (ABILIFY) 5 MG tablet, TAKE ONE TABLET (5 MG TOTAL) BY MOUTH DAILY, Disp: 30 tablet, Rfl: 3 .  atorvastatin (LIPITOR) 20 MG tablet, Take 40 mg by mouth at bedtime., Disp: , Rfl:  .  cilostazol (PLETAL) 100 MG tablet, Take 100 mg by mouth 2 (two) times daily., Disp: , Rfl:  .  dexamethasone (DECADRON) 4 MG tablet, Take 2 tablets (8 mg total) by mouth daily. Start the day after chemotherapy for 2 days. Take with food., Disp: 30 tablet, Rfl: 1 .  dicyclomine (BENTYL) 10 MG capsule, Take 1 capsule (10 mg total) by mouth 3 (three) times daily before meals., Disp: 90 capsule, Rfl: 0 .  DULoxetine (CYMBALTA) 30 MG capsule, Take 30 mg by mouth 2 (two) times daily. , Disp: , Rfl:  .  levothyroxine (SYNTHROID) 100 MCG tablet, Take 1 tablet (100 mcg  total) by mouth daily before breakfast., Disp: 90 tablet, Rfl: 0 .  lidocaine-prilocaine (EMLA) cream, Apply to affected area once, Disp: 30 g, Rfl: 3 .  lisinopril (ZESTRIL) 20 MG tablet, Take 20 mg by mouth daily., Disp: , Rfl:  .  lubiprostone (AMITIZA) 8 MCG capsule, Take 1 capsule (8 mcg total) by mouth 2 (two) times daily with a meal. (Patient taking differently: Take 8 mcg by mouth 2 (two) times daily with a meal. 24 once, 8 bid), Disp: 60 capsule, Rfl: 3 .  mirtazapine (REMERON SOL-TAB) 15 MG disintegrating tablet, Take 15 mg by mouth daily., Disp: , Rfl:  .  NUVIGIL 250 MG tablet, Take 250 mg by mouth daily., Disp: , Rfl:  .  ondansetron (ZOFRAN) 8 MG tablet, Take 1 tablet (8 mg total) by mouth 2 (two) times daily as needed for refractory nausea / vomiting. Start on day 3 after chemotherapy., Disp: 30 tablet, Rfl: 1 .  pantoprazole (PROTONIX) 40 MG tablet, Take 1 tablet by mouth daily., Disp: , Rfl:  .  pentoxifylline (TRENTAL) 400 MG CR tablet, , Disp: , Rfl:  .  pregabalin (LYRICA) 200 MG capsule, Take 1 capsule by mouth 3 (three) times daily., Disp: , Rfl:  .  prochlorperazine (COMPAZINE) 10 MG tablet, Take 1 tablet (10 mg total) by  mouth every 6 (six) hours as needed (Nausea or vomiting)., Disp: 30 tablet, Rfl: 1 .  SODIUM FLUORIDE 5000 PPM 1.1 % PSTE, SMARTSIG:Sparingly Topical Every Night, Disp: , Rfl:  .  traMADol (ULTRAM) 50 MG tablet, Take 1 tablet (50 mg total) by mouth every 8 (eight) hours as needed., Disp: 90 tablet, Rfl: 0  Allergies: No Known Allergies  Past Medical History, Surgical history, Social history, and Family History were reviewed and updated.  Review of Systems: Review of Systems  Constitutional: Negative.   HENT:  Negative.   Eyes: Negative.   Respiratory: Negative.   Cardiovascular: Negative.   Gastrointestinal: Negative.   Endocrine: Negative.   Genitourinary: Negative.    Musculoskeletal: Negative.   Skin: Negative.   Neurological: Negative.    Hematological: Negative.   Psychiatric/Behavioral: Negative.     Physical Exam:  height is 6' (1.829 m) and weight is 90.3 kg. His oral temperature is 98.1 F (36.7 C). His blood pressure is 130/80 and his pulse is 82. His respiration is 18 and oxygen saturation is 100%.   Wt Readings from Last 3 Encounters:  11/05/20 90.3 kg  10/24/20 90.7 kg  10/14/20 92.1 kg    Physical Exam Vitals reviewed.  HENT:     Head: Normocephalic and atraumatic.  Eyes:     Pupils: Pupils are equal, round, and reactive to light.  Cardiovascular:     Rate and Rhythm: Normal rate and regular rhythm.     Heart sounds: Normal heart sounds.  Pulmonary:     Effort: Pulmonary effort is normal.     Breath sounds: Normal breath sounds.  Abdominal:     General: Bowel sounds are normal.     Palpations: Abdomen is soft.  Musculoskeletal:        General: No tenderness or deformity. Normal range of motion.     Cervical back: Normal range of motion.  Lymphadenopathy:     Cervical: No cervical adenopathy.  Skin:    General: Skin is warm and dry.     Findings: No erythema or rash.  Neurological:     Mental Status: He is alert and oriented to person, place, and time.  Psychiatric:        Behavior: Behavior normal.        Thought Content: Thought content normal.        Judgment: Judgment normal.      Lab Results  Component Value Date   WBC 3.8 (L) 11/05/2020   HGB 9.4 (L) 11/05/2020   HCT 28.8 (L) 11/05/2020   MCV 88.1 11/05/2020   PLT 181 11/05/2020     Chemistry      Component Value Date/Time   NA 140 11/05/2020 1020   K 3.7 11/05/2020 1020   CL 107 11/05/2020 1020   CO2 28 11/05/2020 1020   BUN 12 11/05/2020 1020   CREATININE 0.83 11/05/2020 1020   CREATININE 0.96 09/05/2020 0000      Component Value Date/Time   CALCIUM 9.0 11/05/2020 1020   ALKPHOS 91 11/05/2020 1020   AST 22 11/05/2020 1020   ALT 19 11/05/2020 1020   BILITOT 0.2 (L) 11/05/2020 1020      Impression and  Plan: Mr. Eddie Hernandez is a very nice 65 year old white male.  He has metastatic adenocarcinoma of the GE junction..  This will be his second cycle of chemotherapy with nivolumab.  Again, since the tumor is HER2 positive, we will proceed with Herceptin with the next cycle.  We will have to  get an echocardiogram on him.  I have to believe that given the fact that his tumor is HER2 positive, we will be able to see a good response with treatment.  I would plan for a CT scan/PET scan after the fourth cycle of treatment.  We will plan to get him back in 2 more weeks for his third cycle of treatment.   [name], MD 6/8/202211:23 AM

## 2020-11-05 NOTE — Telephone Encounter (Signed)
appts made per 11/05/20 los and pt to gain sch in tx/avs and through [employer]

## 2020-11-06 ENCOUNTER — Encounter: Payer: Self-pay | Admitting: *Deleted

## 2020-11-06 LAB — T4: T4, Total: 7.4 ug/dL (ref 4.5–12.0)

## 2020-11-06 NOTE — Progress Notes (Signed)
Order for ECHO placed. Called multiple locations but earliest appointment is 11/21/2020, which is after the patient's planned Herceptin start. Spoke to Dr Marin Olp and he does not want to delay treatment. We will get the ECHO after his first treatment. Pharmacy notified.   Called patient and notified him of appointment, date, time and location.   Also relayed the following message:  Please call Eddie Hernandez that the thyroid level is down a little bit.  I would just keep on the Synthroid for right now at the 100 mcg dose.  We will see what his levelis the next time he gets treated.  Eddie Hernandez  Patient is aware.   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

## 2020-11-07 ENCOUNTER — Other Ambulatory Visit: Payer: Self-pay

## 2020-11-07 ENCOUNTER — Inpatient Hospital Stay: Payer: BC Managed Care – PPO

## 2020-11-07 VITALS — BP 128/54 | HR 91 | Temp 98.1°F | Resp 18

## 2020-11-07 DIAGNOSIS — C160 Malignant neoplasm of cardia: Secondary | ICD-10-CM

## 2020-11-07 MED ORDER — HEPARIN SOD (PORK) LOCK FLUSH 100 UNIT/ML IV SOLN
500.0000 [IU] | Freq: Once | INTRAVENOUS | Status: AC | PRN
  Administered 2020-11-07: 500 [IU]
  Filled 2020-11-07: qty 5

## 2020-11-07 MED ORDER — SODIUM CHLORIDE 0.9% FLUSH
10.0000 mL | INTRAVENOUS | Status: DC | PRN
  Administered 2020-11-07: 10 mL
  Filled 2020-11-07: qty 10

## 2020-11-09 ENCOUNTER — Other Ambulatory Visit: Payer: Self-pay | Admitting: Sports Medicine

## 2020-11-09 DIAGNOSIS — K5909 Other constipation: Secondary | ICD-10-CM

## 2020-11-09 NOTE — Telephone Encounter (Signed)
TO PCP

## 2020-11-14 ENCOUNTER — Telehealth: Payer: Self-pay | Admitting: *Deleted

## 2020-11-14 NOTE — Telephone Encounter (Signed)
Patient called and stated,"my nose has been dripping on and off for two days now. I don't have a cough, no sneezing or fevers. The inside of my nose is raw. Also, I had a tooth to break off last month and the root is exposed. I think I might have an abscess." I instructed him to call his dentist so, the tooth could be evaluated. He verbalized understanding.   Patient called back and stated,"my dentist was on vacation. Whoever was on call sent me in a prescription for Amoxicillin. Also, told me that if the tooth got worse over the weekend to go to the ER." I also told him to call this office on Monday and let us know how he is feeling. He is supposed to have chemotherapy on Tuesday. He verbalized understanding.

## 2020-11-14 NOTE — Telephone Encounter (Signed)
Sent disability papers to [facility]. 

## 2020-11-18 ENCOUNTER — Other Ambulatory Visit: Payer: Self-pay

## 2020-11-18 ENCOUNTER — Inpatient Hospital Stay: Payer: BC Managed Care – PPO

## 2020-11-18 ENCOUNTER — Encounter: Payer: Self-pay | Admitting: *Deleted

## 2020-11-18 ENCOUNTER — Other Ambulatory Visit: Payer: Self-pay | Admitting: Family Medicine

## 2020-11-18 ENCOUNTER — Encounter: Payer: Self-pay | Admitting: Hematology & Oncology

## 2020-11-18 ENCOUNTER — Inpatient Hospital Stay (HOSPITAL_BASED_OUTPATIENT_CLINIC_OR_DEPARTMENT_OTHER): Payer: BC Managed Care – PPO | Admitting: Hematology & Oncology

## 2020-11-18 VITALS — BP 127/71 | HR 86 | Temp 97.9°F | Resp 18 | Wt 201.0 lb

## 2020-11-18 DIAGNOSIS — C160 Malignant neoplasm of cardia: Secondary | ICD-10-CM

## 2020-11-18 LAB — CBC WITH DIFFERENTIAL (CANCER CENTER ONLY)
Abs Immature Granulocytes: 0 10*3/uL (ref 0.00–0.07)
Basophils Absolute: 0 10*3/uL (ref 0.0–0.1)
Basophils Relative: 2 %
Eosinophils Absolute: 0.1 10*3/uL (ref 0.0–0.5)
Eosinophils Relative: 5 %
HCT: 28.6 % — ABNORMAL LOW (ref 39.0–52.0)
Hemoglobin: 9.4 g/dL — ABNORMAL LOW (ref 13.0–17.0)
Immature Granulocytes: 0 %
Lymphocytes Relative: 39 %
Lymphs Abs: 1.1 10*3/uL (ref 0.7–4.0)
MCH: 29 pg (ref 26.0–34.0)
MCHC: 32.9 g/dL (ref 30.0–36.0)
MCV: 88.3 fL (ref 80.0–100.0)
Monocytes Absolute: 0.5 10*3/uL (ref 0.1–1.0)
Monocytes Relative: 16 %
Neutro Abs: 1.1 10*3/uL — ABNORMAL LOW (ref 1.7–7.7)
Neutrophils Relative %: 38 %
Platelet Count: 149 10*3/uL — ABNORMAL LOW (ref 150–400)
RBC: 3.24 MIL/uL — ABNORMAL LOW (ref 4.22–5.81)
RDW: 14.9 % (ref 11.5–15.5)
WBC Count: 2.7 10*3/uL — ABNORMAL LOW (ref 4.0–10.5)
nRBC: 0 % (ref 0.0–0.2)

## 2020-11-18 LAB — CMP (CANCER CENTER ONLY)
ALT: 21 U/L (ref 0–44)
AST: 23 U/L (ref 15–41)
Albumin: 3.8 g/dL (ref 3.5–5.0)
Alkaline Phosphatase: 91 U/L (ref 38–126)
Anion gap: 7 (ref 5–15)
BUN: 18 mg/dL (ref 8–23)
CO2: 26 mmol/L (ref 22–32)
Calcium: 9 mg/dL (ref 8.9–10.3)
Chloride: 104 mmol/L (ref 98–111)
Creatinine: 0.87 mg/dL (ref 0.61–1.24)
GFR, Estimated: >60 mL/min (ref >60)
Glucose, Bld: 168 mg/dL — ABNORMAL HIGH (ref 70–99)
Potassium: 3.5 mmol/L (ref 3.5–5.1)
Sodium: 137 mmol/L (ref 135–145)
Total Bilirubin: 0.3 mg/dL (ref 0.3–1.2)
Total Protein: 5.9 g/dL — ABNORMAL LOW (ref 6.5–8.1)

## 2020-11-18 LAB — LACTATE DEHYDROGENASE: LDH: 186 U/L (ref 98–192)

## 2020-11-18 MED ORDER — TRASTUZUMAB-DKST CHEMO 150 MG IV SOLR
6.0000 mg/kg | Freq: Once | INTRAVENOUS | Status: AC
  Administered 2020-11-18: 546 mg via INTRAVENOUS
  Filled 2020-11-18: qty 26

## 2020-11-18 MED ORDER — HEPARIN SOD (PORK) LOCK FLUSH 100 UNIT/ML IV SOLN
500.0000 [IU] | Freq: Once | INTRAVENOUS | Status: DC | PRN
  Filled 2020-11-18: qty 5

## 2020-11-18 MED ORDER — ACETAMINOPHEN 325 MG PO TABS
ORAL_TABLET | ORAL | Status: AC
  Filled 2020-11-18: qty 2

## 2020-11-18 MED ORDER — OXALIPLATIN CHEMO INJECTION 100 MG/20ML
85.0000 mg/m2 | Freq: Once | INTRAVENOUS | Status: AC
  Administered 2020-11-18: 185 mg via INTRAVENOUS
  Filled 2020-11-18: qty 37

## 2020-11-18 MED ORDER — SODIUM CHLORIDE 0.9 % IV SOLN
10.0000 mg | Freq: Once | INTRAVENOUS | Status: AC
  Administered 2020-11-18: 10 mg via INTRAVENOUS
  Filled 2020-11-18: qty 10

## 2020-11-18 MED ORDER — LEUCOVORIN CALCIUM INJECTION 350 MG
400.0000 mg/m2 | Freq: Once | INTRAVENOUS | Status: AC
  Administered 2020-11-18: 864 mg via INTRAVENOUS
  Filled 2020-11-18: qty 18.2

## 2020-11-18 MED ORDER — SODIUM CHLORIDE 0.9 % IV SOLN
Freq: Once | INTRAVENOUS | Status: AC
Start: 2020-11-18 — End: 2020-11-18
  Filled 2020-11-18: qty 250

## 2020-11-18 MED ORDER — ACETAMINOPHEN 325 MG PO TABS
650.0000 mg | ORAL_TABLET | Freq: Once | ORAL | Status: AC
  Administered 2020-11-18: 650 mg via ORAL

## 2020-11-18 MED ORDER — DEXTROSE 5 % IV SOLN
Freq: Once | INTRAVENOUS | Status: AC
  Filled 2020-11-18: qty 250

## 2020-11-18 MED ORDER — PALONOSETRON HCL INJECTION 0.25 MG/5ML
INTRAVENOUS | Status: AC
  Filled 2020-11-18: qty 5

## 2020-11-18 MED ORDER — DIPHENHYDRAMINE HCL 25 MG PO CAPS
ORAL_CAPSULE | ORAL | Status: AC
  Filled 2020-11-18: qty 2

## 2020-11-18 MED ORDER — DIPHENHYDRAMINE HCL 25 MG PO CAPS
50.0000 mg | ORAL_CAPSULE | Freq: Once | ORAL | Status: AC
  Administered 2020-11-18: 50 mg via ORAL

## 2020-11-18 MED ORDER — SODIUM CHLORIDE 0.9 % IV SOLN
480.0000 mg | Freq: Once | INTRAVENOUS | Status: AC
  Administered 2020-11-18: 480 mg via INTRAVENOUS
  Filled 2020-11-18: qty 48

## 2020-11-18 MED ORDER — SODIUM CHLORIDE 0.9 % IV SOLN
2325.0000 mg/m2 | INTRAVENOUS | Status: DC
  Administered 2020-11-18: 5000 mg via INTRAVENOUS
  Filled 2020-11-18: qty 100

## 2020-11-18 MED ORDER — PALONOSETRON HCL INJECTION 0.25 MG/5ML
0.2500 mg | Freq: Once | INTRAVENOUS | Status: AC
  Administered 2020-11-18: 0.25 mg via INTRAVENOUS

## 2020-11-18 MED ORDER — SODIUM CHLORIDE 0.9% FLUSH
10.0000 mL | INTRAVENOUS | Status: DC | PRN
  Filled 2020-11-18: qty 10

## 2020-11-18 MED ORDER — FLUOROURACIL CHEMO INJECTION 2.5 GM/50ML
400.0000 mg/m2 | Freq: Once | INTRAVENOUS | Status: AC
  Administered 2020-11-18: 850 mg via INTRAVENOUS
  Filled 2020-11-18: qty 17

## 2020-11-18 NOTE — Progress Notes (Signed)
Hematology and Oncology Follow Up Visit  Eddie Hernandez [id number] 07/13/1955 65 y.o. 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  So far, he is nicely.  We are going to add Herceptin now.  We found that his tumor is HER2 positive.  He has had no bleeding.  Is been no abdominal pain.  He has had no issues with nausea or vomiting.  He has had no fever.  There has been no leg swelling.  He has had no cough or shortness of breath.  Currently, his performance status is ECOG 1.    Medications:  Current Outpatient Medications:    acetaminophen (TYLENOL) 500 MG tablet, Take 1,000 mg by mouth every 6 (six) hours as needed., Disp: , Rfl:    amoxicillin (AMOXIL) 500 MG capsule, Take 500 mg by mouth 3 (three) times daily., Disp: , Rfl:    ARIPiprazole (ABILIFY) 5 MG tablet, TAKE ONE TABLET (5 MG TOTAL) BY MOUTH DAILY, Disp: 30 tablet, Rfl: 3   atorvastatin (LIPITOR) 20 MG tablet, Take 40 mg by mouth at bedtime., Disp: , Rfl:    cilostazol (PLETAL) 100 MG tablet, Take 100 mg by mouth 2 (two) times daily., Disp: , Rfl:    dicyclomine (BENTYL) 10 MG capsule, Take 1 capsule (10 mg total) by mouth 3 (three) times daily before meals., Disp: 90 capsule, Rfl: 0   DULoxetine (CYMBALTA) 30 MG capsule, Take 30 mg by mouth 2 (two) times daily. , Disp: , Rfl:    levothyroxine (SYNTHROID) 100 MCG tablet, Take 1 tablet (100 mcg total) by mouth daily before breakfast., Disp: 90 tablet, Rfl: 0   lidocaine-prilocaine (EMLA) cream, Apply to affected area once, Disp: 30 g, Rfl: 3   lisinopril (ZESTRIL) 20 MG tablet, Take 20 mg by mouth daily., Disp: , Rfl:    lubiprostone (AMITIZA) 8 MCG capsule, TAKE 1 CAPSULE (8 MCG TOTAL) BY MOUTH 2 (TWO) TIMES DAILY WITH A MEAL., Disp: 180 capsule, Rfl: 2   mirtazapine (REMERON  SOL-TAB) 15 MG disintegrating tablet, Take 15 mg by mouth daily., Disp: , Rfl:    NUVIGIL 250 MG tablet, Take 250 mg by mouth daily., Disp: , Rfl:    ondansetron (ZOFRAN) 8 MG tablet, Take 1 tablet (8 mg total) by mouth 2 (two) times daily as needed for refractory nausea / vomiting. Start on day 3 after chemotherapy., Disp: 30 tablet, Rfl: 1   pantoprazole (PROTONIX) 40 MG tablet, Take 1 tablet by mouth daily., Disp: , Rfl:    pentoxifylline (TRENTAL) 400 MG CR tablet, , Disp: , Rfl:    pregabalin (LYRICA) 200 MG capsule, Take 1 capsule by mouth 3 (three) times daily., Disp: , Rfl:    prochlorperazine (COMPAZINE) 10 MG tablet, Take 1 tablet (10 mg total) by mouth every 6 (six) hours as needed (Nausea or vomiting)., Disp: 30 tablet, Rfl: 1   SODIUM FLUORIDE 5000 PPM 1.1 % PSTE, SMARTSIG:Sparingly Topical Every Night, Disp: , Rfl:    traMADol (ULTRAM) 50 MG tablet, Take 1 tablet (50 mg total) by mouth every 8 (eight) hours as needed., Disp: 90 tablet, Rfl: 0   dexamethasone (DECADRON) 4 MG tablet, Take 2 tablets (8 mg total) by mouth daily. Start the day after chemotherapy for 2 days. Take with food. (Patient not taking: Reported on 11/18/2020), Disp:  30 tablet, Rfl: 1 No current facility-administered medications for this visit.  Facility-Administered Medications Ordered in Other Visits:    dextrose 5 % solution, , Intravenous, Once, Lashondra Vaquerano, Rudell Cobb, MD   fluorouracil (ADRUCIL) 5,000 mg in sodium chloride 0.9 % 150 mL chemo infusion, 2,325 mg/m2 (Treatment Plan Recorded), Intravenous, 1 day or 1 dose, Johathon Overturf, Rudell Cobb, MD   fluorouracil (ADRUCIL) chemo injection 850 mg, 400 mg/m2 (Treatment Plan Recorded), Intravenous, Once, Rozelle Caudle, Rudell Cobb, MD   heparin lock flush 100 unit/mL, 500 Units, Intracatheter, Once PRN, [name], MD   leucovorin 864 mg in dextrose 5 % 250 mL infusion, 400 mg/m2 (Treatment Plan Recorded), Intravenous, Once, Louis Gaw, Rudell Cobb, MD   oxaliplatin (ELOXATIN) 185 mg in  dextrose 5 % 500 mL chemo infusion, 85 mg/m2 (Treatment Plan Recorded), Intravenous, Once, Jaymie Mckiddy, Rudell Cobb, MD   sodium chloride flush (NS) 0.9 % injection 10 mL, 10 mL, Intracatheter, PRN, Antoniette Peake, Rudell Cobb, MD   trastuzumab-dkst (OGIVRI) 546 mg in sodium chloride 0.9 % 250 mL chemo infusion, 6 mg/kg (Treatment Plan Recorded), Intravenous, Once, Cena Bruhn, Rudell Cobb, MD  Allergies: No Known Allergies  Past Medical History, Surgical history, Social history, and Family History were reviewed and updated.  Review of Systems: Review of Systems  Constitutional: Negative.   HENT:  Negative.    Eyes: Negative.   Respiratory: Negative.    Cardiovascular: Negative.   Gastrointestinal: Negative.   Endocrine: Negative.   Genitourinary: Negative.    Musculoskeletal: Negative.   Skin: Negative.   Neurological: Negative.   Hematological: Negative.   Psychiatric/Behavioral: Negative.     Physical Exam:  weight is 201 lb (91.2 kg). His oral temperature is 97.9 F (36.6 C). His blood pressure is 127/71 and his pulse is 86. His respiration is 18 and oxygen saturation is 98%.   Wt Readings from Last 3 Encounters:  11/18/20 201 lb (91.2 kg)  11/05/20 199 lb (90.3 kg)  10/24/20 200 lb (90.7 kg)    Physical Exam Vitals reviewed.  HENT:     Head: Normocephalic and atraumatic.  Eyes:     Pupils: Pupils are equal, round, and reactive to light.  Cardiovascular:     Rate and Rhythm: Normal rate and regular rhythm.     Heart sounds: Normal heart sounds.  Pulmonary:     Effort: Pulmonary effort is normal.     Breath sounds: Normal breath sounds.  Abdominal:     General: Bowel sounds are normal.     Palpations: Abdomen is soft.  Musculoskeletal:        General: No tenderness or deformity. Normal range of motion.     Cervical back: Normal range of motion.  Lymphadenopathy:     Cervical: No cervical adenopathy.  Skin:    General: Skin is warm and dry.     Findings: No erythema or rash.   Neurological:     Mental Status: He is alert and oriented to person, place, and time.  Psychiatric:        Behavior: Behavior normal.        Thought Content: Thought content normal.        Judgment: Judgment normal.     Lab Results  Component Value Date   WBC 2.7 (L) 11/18/2020   HGB 9.4 (L) 11/18/2020   HCT 28.6 (L) 11/18/2020   MCV 88.3 11/18/2020   PLT 149 (L) 11/18/2020     Chemistry      Component Value Date/Time   NA 137  11/18/2020 0930   K 3.5 11/18/2020 0930   CL 104 11/18/2020 0930   CO2 26 11/18/2020 0930   BUN 18 11/18/2020 0930   CREATININE 0.87 11/18/2020 0930   CREATININE 0.96 09/05/2020 0000      Component Value Date/Time   CALCIUM 9.0 11/18/2020 0930   ALKPHOS 91 11/18/2020 0930   AST 23 11/18/2020 0930   ALT 21 11/18/2020 0930   BILITOT 0.3 11/18/2020 0930      Impression and Plan: Eddie Hernandez is a very nice 65 year old white male.  He has metastatic adenocarcinoma of the GE junction..  This will be his second cycle of chemotherapy with nivolumab.  Again, since the tumor is HER2 positive, we will proceed with Herceptin with this cycle.    He will have the echocardiogram done tomorrow.    I have to believe that given the fact that his tumor is HER2 positive, we will be able to see a good response with treatment.  I would plan for a CT scan/PET scan after the fourth cycle of treatment.  We will plan to get him back in 2 more weeks for his 4th cycle of treatment.   [name], MD 6/21/20221:21 PM

## 2020-11-18 NOTE — Progress Notes (Signed)
ANC 1.1.  Ok to treat per Dr Marin Olp.

## 2020-11-18 NOTE — Progress Notes (Signed)
Visited with patient in treatment room. He is doing well today. He does state he felt more fatigued from his second cycle vs his first. Patient states that his disability insurance is waiting from information from this office to pay out on his policy. [place] office, and disability specialist asked and no one has seen these requests. I gave patient my card with fax number and asked him to request that [facility] resend the request which we would respond to asap.   Oncology Nurse Navigator Documentation  Oncology Nurse Navigator Flowsheets 11/18/2020  Abnormal Finding Date -  Confirmed Diagnosis Date -  Diagnosis Status -  Phase of Treatment -  Chemotherapy Actual Start Date: -  Chemotherapy Expected End Date: -  Navigator Follow Up Date: 12/03/2020  Navigator Follow Up Reason: Follow-up Appointment  Navigator Location [facility]  Navigator Encounter Type Appt/Treatment Plan Review;Treatment  Telephone -  Treatment Initiated Date -  Patient Visit Type MedOnc  Treatment Phase Active Tx  Barriers/Navigation Needs Coordination of Care;Education  Education Other  Interventions Coordination of Care;Education;Psycho-Social Support  Acuity Level 2-Minimal Needs (1-2 Barriers Identified)  Coordination of Care Other  Education Method Verbal  Support Groups/Services Friends and Family  Time Spent with Patient 30

## 2020-11-18 NOTE — Patient Instructions (Signed)
[facility]  Discharge Instructions: Thank you for choosing [facility] to provide your oncology and hematology care.   If you have a lab appointment with the [facility], please go directly to the [facility] and check in at the registration area.  Wear comfortable clothing and clothing appropriate for easy access to any Portacath or PICC line.   We strive to give you quality time with your provider. You may need to reschedule your appointment if you arrive late (15 or more minutes).  Arriving late affects you and other patients whose appointments are after yours.  Also, if you miss three or more appointments without notifying the office, you may be dismissed from the clinic at the provider's discretion.      For prescription refill requests, have your pharmacy contact our office and allow 72 hours for refills to be completed.    Today you received the following chemotherapy and/or immunotherapy agents 5FU, Oxaliplatin, Herceptin, Opdivo,       To help prevent nausea and vomiting after your treatment, we encourage you to take your nausea medication as directed.  BELOW ARE SYMPTOMS THAT SHOULD BE REPORTED IMMEDIATELY: *FEVER GREATER THAN 100.4 F (38 C) OR HIGHER *CHILLS OR SWEATING *NAUSEA AND VOMITING THAT IS NOT CONTROLLED WITH YOUR NAUSEA MEDICATION *UNUSUAL SHORTNESS OF BREATH *UNUSUAL BRUISING OR BLEEDING *URINARY PROBLEMS (pain or burning when urinating, or frequent urination) *BOWEL PROBLEMS (unusual diarrhea, constipation, pain near the anus) TENDERNESS IN MOUTH AND THROAT WITH OR WITHOUT PRESENCE OF ULCERS (sore throat, sores in mouth, or a toothache) UNUSUAL RASH, SWELLING OR PAIN  UNUSUAL VAGINAL DISCHARGE OR ITCHING   Items with * indicate a potential emergency and should be followed up as soon as possible or go to the Emergency Department if any problems should occur.  Please show the CHEMOTHERAPY ALERT CARD or IMMUNOTHERAPY  ALERT CARD at check-in to the Emergency Department and triage nurse. Should you have questions after your visit or need to cancel or reschedule your appointment, please contact [facility]  [phone] and follow the prompts.  Office hours are 8:00 a.m. to 4:30 p.m. Monday - Friday. Please note that voicemails left after 4:00 p.m. may not be returned until the following business day.  We are closed weekends and major holidays. You have access to a nurse at all times for urgent questions. Please call the main number to the clinic [phone] and follow the prompts.  For any non-urgent questions, you may also contact your provider using MyChart. We now offer e-Visits for anyone 75 and older to request care online for non-urgent symptoms. For details visit [email or web address].   Also download the MyChart app! Go to the app store, search "MyChart", open the app, select [facility], and log in with your MyChart username and password.  Due to Covid, a mask is required upon entering the hospital/clinic. If you do not have a mask, one will be given to you upon arrival. For doctor visits, patients may have 1 support person aged 4 or older with them. For treatment visits, patients cannot have anyone with them due to current Covid guidelines and our immunocompromised population.

## 2020-11-18 NOTE — Patient Instructions (Signed)

## 2020-11-19 ENCOUNTER — Telehealth: Payer: Self-pay | Admitting: *Deleted

## 2020-11-19 LAB — IRON AND TIBC
Iron: 39 ug/dL — ABNORMAL LOW (ref 42–163)
Saturation Ratios: 12 % — ABNORMAL LOW (ref 20–55)
TIBC: 336 ug/dL (ref 202–409)
UIBC: 297 ug/dL (ref 117–376)

## 2020-11-19 LAB — T4: T4, Total: 7.7 ug/dL (ref 4.5–12.0)

## 2020-11-19 LAB — TSH: TSH: 14.585 u[IU]/mL — ABNORMAL HIGH (ref 0.320–4.118)

## 2020-11-19 LAB — FERRITIN: Ferritin: 100 ng/mL (ref 24–336)

## 2020-11-19 NOTE — Telephone Encounter (Signed)
As noted below by Dr. Marin Olp, I informed the patient that his thyroid is low. His dose needs to be increased to 125 mcg daily. Patient stated," I think I have that dose at home. I will call you back if I need you to call that prescription in." He verbalized understanding of conversation.

## 2020-11-19 NOTE — Telephone Encounter (Signed)
-----   Message from [name], MD sent at 11/19/2020  1:32 PM EDT ----- Please call and tell him that his thyroid is low.  We need to increase his thyroid medication.  Please call and 125 mcg dose daily.  Thanks so much.  Laurey Arrow

## 2020-11-20 ENCOUNTER — Encounter: Payer: Self-pay | Admitting: *Deleted

## 2020-11-20 ENCOUNTER — Other Ambulatory Visit: Payer: Self-pay

## 2020-11-20 ENCOUNTER — Inpatient Hospital Stay: Payer: BC Managed Care – PPO

## 2020-11-20 ENCOUNTER — Ambulatory Visit: Payer: BC Managed Care – PPO | Attending: Internal Medicine

## 2020-11-20 VITALS — BP 150/68 | HR 88 | Temp 97.8°F | Resp 17

## 2020-11-20 DIAGNOSIS — C160 Malignant neoplasm of cardia: Secondary | ICD-10-CM

## 2020-11-20 DIAGNOSIS — Z23 Encounter for immunization: Secondary | ICD-10-CM

## 2020-11-20 MED ORDER — SODIUM CHLORIDE 0.9% FLUSH
10.0000 mL | INTRAVENOUS | Status: DC | PRN
  Administered 2020-11-20: 10 mL
  Filled 2020-11-20: qty 10

## 2020-11-20 MED ORDER — HEPARIN SOD (PORK) LOCK FLUSH 100 UNIT/ML IV SOLN
500.0000 [IU] | Freq: Once | INTRAVENOUS | Status: AC | PRN
  Administered 2020-11-20: 500 [IU]
  Filled 2020-11-20: qty 5

## 2020-11-20 NOTE — Patient Instructions (Signed)

## 2020-11-20 NOTE — Progress Notes (Signed)
Received request from [facility] to submit office notes, path, etc for patient's disability application. It also states to fill out two forms, which were NOT included. [facility] [phone], and requested that he resend the attachments. All forms received.  The team that completes these requests are out of the office, so this navigator completed all forms and medical record request. After MD signature obtained,  faxed to [phone].  Patient called to notify him that request had been responded to in regards to his disability application.   Oncology Nurse Navigator Documentation  Oncology Nurse Navigator Flowsheets 11/20/2020  Abnormal Finding Date -  Confirmed Diagnosis Date -  Diagnosis Status -  Phase of Treatment -  Chemotherapy Actual Start Date: -  Chemotherapy Expected End Date: -  Navigator Follow Up Date: 12/03/2020  Navigator Follow Up Reason: Follow-up Appointment  Navigator Location [facility]  Navigator Encounter Type Other:;Telephone  Telephone FMLA/Disability;Outgoing Call  Treatment Initiated Date -  Patient Visit Type MedOnc  Treatment Phase Active Tx  Barriers/Navigation Needs Coordination of Care;Education  Education -  Interventions Disability/FMLA;Education;Psycho-Social Support  Acuity Level 2-Minimal Needs (1-2 Barriers Identified)  Coordination of Care -  Education Method Verbal  Support Groups/Services Friends and Family  Time Spent with Patient 20

## 2020-11-20 NOTE — Progress Notes (Signed)
   Covid-19 Vaccination Clinic  Name:  TANYA MARVIN    MRN: [id number] DOB: [birth year]  11/20/2020  Mr. Martino was observed post Covid-19 immunization for 15 minutes without incident. He was provided with Vaccine Information Sheet and instruction to access the V-Safe system.   Mr. Dollins was instructed to call 911 with any severe reactions post vaccine: Difficulty breathing  Swelling of face and throat  A fast heartbeat  A bad rash all over body  Dizziness and weakness   Immunizations Administered     Name Date Dose VIS Date Route   Moderna Covid-19 Booster Vaccine 11/20/2020  1:06 PM 0.25 mL 03/19/2020 Intramuscular   Manufacturer: Moderna   Lot: [id number]   [place]: [id number]

## 2020-11-21 ENCOUNTER — Encounter: Payer: Self-pay | Admitting: *Deleted

## 2020-11-21 ENCOUNTER — Encounter: Payer: Self-pay | Admitting: Hematology & Oncology

## 2020-11-21 ENCOUNTER — Telehealth: Payer: Self-pay | Admitting: *Deleted

## 2020-11-21 ENCOUNTER — Other Ambulatory Visit (HOSPITAL_BASED_OUTPATIENT_CLINIC_OR_DEPARTMENT_OTHER): Payer: Self-pay

## 2020-11-21 ENCOUNTER — Ambulatory Visit (HOSPITAL_COMMUNITY)
Admission: RE | Admit: 2020-11-21 | Discharge: 2020-11-21 | Disposition: A | Discharge status: Home / Self Care | Payer: BC Managed Care – PPO | Source: Ambulatory Visit | Attending: Hematology & Oncology | Admitting: Hematology & Oncology

## 2020-11-21 DIAGNOSIS — Z0189 Encounter for other specified special examinations: Secondary | ICD-10-CM | POA: Diagnosis not present

## 2020-11-21 DIAGNOSIS — I119 Hypertensive heart disease without heart failure: Secondary | ICD-10-CM | POA: Diagnosis not present

## 2020-11-21 DIAGNOSIS — Z01810 Encounter for preprocedural cardiovascular examination: Secondary | ICD-10-CM | POA: Insufficient documentation

## 2020-11-21 DIAGNOSIS — J449 Chronic obstructive pulmonary disease, unspecified: Secondary | ICD-10-CM | POA: Insufficient documentation

## 2020-11-21 DIAGNOSIS — C160 Malignant neoplasm of cardia: Secondary | ICD-10-CM | POA: Insufficient documentation

## 2020-11-21 DIAGNOSIS — E785 Hyperlipidemia, unspecified: Secondary | ICD-10-CM | POA: Insufficient documentation

## 2020-11-21 LAB — ECHOCARDIOGRAM COMPLETE
Area-P 1/2: 2.2 cm2
S' Lateral: 1.8 cm

## 2020-11-21 MED ORDER — COVID-19 MRNA VACC (MODERNA) 100 MCG/0.5ML IM SUSP
INTRAMUSCULAR | 0 refills | Status: DC
  Filled 2020-11-21: qty 0.25, 1d supply, fill #0

## 2020-11-21 NOTE — Telephone Encounter (Signed)
Message received from patient stating that his eyes are "watering so bad that he can hardly drive," and would like to know if there is anything over the counter he can take for it.  Dr. Marin Olp notified. Call placed back to patient and patient notified to call his Opthamologist regarding eye watering.  Pt states that he will contact his Opthamologist office now.

## 2020-11-27 ENCOUNTER — Other Ambulatory Visit: Payer: Self-pay | Admitting: Family Medicine

## 2020-11-28 ENCOUNTER — Telehealth: Payer: Self-pay | Admitting: *Deleted

## 2020-11-28 NOTE — Telephone Encounter (Signed)
Called patient and informed him of appointment being rescheduled  from 12/03/20 to 12/04/20 - patient confirmed

## 2020-12-03 ENCOUNTER — Inpatient Hospital Stay: Payer: BC Managed Care – PPO

## 2020-12-03 ENCOUNTER — Inpatient Hospital Stay: Payer: BC Managed Care – PPO | Admitting: Family

## 2020-12-04 ENCOUNTER — Inpatient Hospital Stay: Payer: BC Managed Care – PPO

## 2020-12-04 ENCOUNTER — Other Ambulatory Visit: Payer: Self-pay

## 2020-12-04 ENCOUNTER — Ambulatory Visit: Payer: BC Managed Care – PPO

## 2020-12-04 ENCOUNTER — Inpatient Hospital Stay (HOSPITAL_BASED_OUTPATIENT_CLINIC_OR_DEPARTMENT_OTHER): Payer: BC Managed Care – PPO | Admitting: Hematology & Oncology

## 2020-12-04 ENCOUNTER — Encounter: Payer: Self-pay | Admitting: Hematology & Oncology

## 2020-12-04 ENCOUNTER — Inpatient Hospital Stay: Payer: BC Managed Care – PPO | Attending: Hematology & Oncology

## 2020-12-04 ENCOUNTER — Encounter: Payer: Self-pay | Admitting: *Deleted

## 2020-12-04 VITALS — BP 110/45 | HR 70 | Temp 97.7°F | Resp 17 | Wt 203.0 lb

## 2020-12-04 DIAGNOSIS — D649 Anemia, unspecified: Secondary | ICD-10-CM | POA: Diagnosis not present

## 2020-12-04 DIAGNOSIS — Z79899 Other long term (current) drug therapy: Secondary | ICD-10-CM | POA: Diagnosis not present

## 2020-12-04 DIAGNOSIS — C160 Malignant neoplasm of cardia: Secondary | ICD-10-CM | POA: Insufficient documentation

## 2020-12-04 DIAGNOSIS — Z5112 Encounter for antineoplastic immunotherapy: Secondary | ICD-10-CM | POA: Diagnosis present

## 2020-12-04 DIAGNOSIS — E876 Hypokalemia: Secondary | ICD-10-CM | POA: Insufficient documentation

## 2020-12-04 DIAGNOSIS — Z5111 Encounter for antineoplastic chemotherapy: Secondary | ICD-10-CM | POA: Diagnosis present

## 2020-12-04 DIAGNOSIS — C169 Malignant neoplasm of stomach, unspecified: Secondary | ICD-10-CM

## 2020-12-04 LAB — CBC WITH DIFFERENTIAL (CANCER CENTER ONLY)
Abs Immature Granulocytes: 0.01 10*3/uL (ref 0.00–0.07)
Basophils Absolute: 0 10*3/uL (ref 0.0–0.1)
Basophils Relative: 1 %
Eosinophils Absolute: 0 10*3/uL (ref 0.0–0.5)
Eosinophils Relative: 2 %
HCT: 24.6 % — ABNORMAL LOW (ref 39.0–52.0)
Hemoglobin: 8.5 g/dL — ABNORMAL LOW (ref 13.0–17.0)
Immature Granulocytes: 1 %
Lymphocytes Relative: 37 %
Lymphs Abs: 0.6 10*3/uL — ABNORMAL LOW (ref 0.7–4.0)
MCH: 30.1 pg (ref 26.0–34.0)
MCHC: 34.6 g/dL (ref 30.0–36.0)
MCV: 87.2 fL (ref 80.0–100.0)
Monocytes Absolute: 0.4 10*3/uL (ref 0.1–1.0)
Monocytes Relative: 24 %
Neutro Abs: 0.5 10*3/uL — ABNORMAL LOW (ref 1.7–7.7)
Neutrophils Relative %: 35 %
Platelet Count: 144 10*3/uL — ABNORMAL LOW (ref 150–400)
RBC: 2.82 MIL/uL — ABNORMAL LOW (ref 4.22–5.81)
RDW: 17.6 % — ABNORMAL HIGH (ref 11.5–15.5)
WBC Count: 1.5 10*3/uL — ABNORMAL LOW (ref 4.0–10.5)
nRBC: 0 % (ref 0.0–0.2)

## 2020-12-04 LAB — CMP (CANCER CENTER ONLY)
ALT: 26 U/L (ref 0–44)
AST: 27 U/L (ref 15–41)
Albumin: 3.4 g/dL — ABNORMAL LOW (ref 3.5–5.0)
Alkaline Phosphatase: 83 U/L (ref 38–126)
Anion gap: 9 (ref 5–15)
BUN: 10 mg/dL (ref 8–23)
CO2: 26 mmol/L (ref 22–32)
Calcium: 7.3 mg/dL — ABNORMAL LOW (ref 8.9–10.3)
Chloride: 101 mmol/L (ref 98–111)
Creatinine: 0.97 mg/dL (ref 0.61–1.24)
GFR, Estimated: >60 mL/min (ref >60)
Glucose, Bld: 155 mg/dL — ABNORMAL HIGH (ref 70–99)
Potassium: 2.3 mmol/L — CL (ref 3.5–5.1)
Sodium: 136 mmol/L (ref 135–145)
Total Bilirubin: 0.4 mg/dL (ref 0.3–1.2)
Total Protein: 5.1 g/dL — ABNORMAL LOW (ref 6.5–8.1)

## 2020-12-04 LAB — TSH: TSH: 25.206 u[IU]/mL — ABNORMAL HIGH (ref 0.320–4.118)

## 2020-12-04 LAB — LACTATE DEHYDROGENASE: LDH: 197 U/L — ABNORMAL HIGH (ref 98–192)

## 2020-12-04 MED ORDER — POTASSIUM CHLORIDE 10 MEQ/100ML IV SOLN
10.0000 meq | INTRAVENOUS | Status: AC
  Administered 2020-12-04 (×2): 10 meq via INTRAVENOUS
  Filled 2020-12-04: qty 100

## 2020-12-04 MED ORDER — HEPARIN SOD (PORK) LOCK FLUSH 100 UNIT/ML IV SOLN
500.0000 [IU] | Freq: Once | INTRAVENOUS | Status: AC
  Administered 2020-12-04: 500 [IU] via INTRAVENOUS
  Filled 2020-12-04: qty 5

## 2020-12-04 MED ORDER — SODIUM CHLORIDE 0.9 % IV SOLN
2.0000 g | Freq: Once | INTRAVENOUS | Status: AC
Start: 2020-12-04 — End: 2020-12-04
  Administered 2020-12-04: 2 g via INTRAVENOUS
  Filled 2020-12-04: qty 20

## 2020-12-04 MED ORDER — SODIUM CHLORIDE 0.9% FLUSH
10.0000 mL | Freq: Once | INTRAVENOUS | Status: AC
  Administered 2020-12-04: 10 mL via INTRAVENOUS
  Filled 2020-12-04: qty 10

## 2020-12-04 MED ORDER — LEVOTHYROXINE SODIUM 150 MCG PO TABS: 150.0000 ug | ORAL_TABLET | Freq: Every day | ORAL | 4 refills | Status: DC

## 2020-12-04 MED ORDER — DIPHENOXYLATE-ATROPINE 2.5-0.025 MG PO TABS: 2.0000 | ORAL_TABLET | Freq: Four times a day (QID) | ORAL | 0 refills | Status: DC | PRN

## 2020-12-04 MED ORDER — SODIUM CHLORIDE 0.9 % IV SOLN
INTRAVENOUS | Status: AC
Start: 2020-12-04 — End: 2020-12-04
  Filled 2020-12-04 (×2): qty 250

## 2020-12-04 MED ORDER — POTASSIUM CHLORIDE CRYS ER 20 MEQ PO TBCR: 40.0000 meq | EXTENDED_RELEASE_TABLET | Freq: Two times a day (BID) | ORAL | 3 refills | Status: DC

## 2020-12-04 NOTE — Patient Instructions (Signed)

## 2020-12-04 NOTE — Progress Notes (Signed)
Hematology and Oncology Follow Up Visit  Eddie Hernandez [id number] [birth year] 65 y.o. 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  Unfortunately, his blood counts just not good enough right now to be treated.  In addition, he had some teeth extracted yesterday.  He was having problems with them.  His oral surgeon did a great job and extracting the teeth.  I just do not feel comfortable given his blood counts giving him treatment today.  He also has profound hypokalemia.  His potassium is 2.3.  His calcium is 7.7.  I went to give him both IV potassium and IV calcium.  I would have to get him on oral potassium.  Surprisingly, his TSH keeps going up.  Today, TSH was 25.  He is on Synthroid.  We will get have to increase the Synthroid dose up to 150 mcg.  He had a lot of diarrhea after his last cycle of treatment.  He never told us.  He took some Imodium.  I will have to try to give him some Lomotil.  He has had no bleeding.  There is been no issues with vomiting.  He has had no rashes.  Has been no fever.  He had an echocardiogram done on 11/21/2020.  This showed an ejection fraction of 60-65%.  Currently, his performance status is ECOG 1.    Medications:  Current Outpatient Medications:    acetaminophen (TYLENOL) 500 MG tablet, Take 1,000 mg by mouth every 6 (six) hours as needed., Disp: , Rfl:    ARIPiprazole (ABILIFY) 5 MG tablet, TAKE ONE TABLET (5 MG TOTAL) BY MOUTH DAILY, Disp: 30 tablet, Rfl: 3   cilostazol (PLETAL) 100 MG tablet, Take 100 mg by mouth 2 (two) times daily., Disp: , Rfl:    COVID-19 mRNA vaccine, Moderna, 100 MCG/0.5ML injection, Inject into the muscle., Disp: 0.25 mL, Rfl: 0   dexamethasone (DECADRON) 4 MG tablet, Take 2 tablets (8 mg total) by mouth daily. Start the  day after chemotherapy for 2 days. Take with food. (Patient not taking: Reported on 11/18/2020), Disp: 30 tablet, Rfl: 1   dicyclomine (BENTYL) 10 MG capsule, TAKE 1 CAPSULE (10 MG TOTAL) BY MOUTH 3 (THREE) TIMES DAILY BEFORE MEALS., Disp: 90 capsule, Rfl: 0   DULoxetine (CYMBALTA) 60 MG capsule, Take 60 mg by mouth daily., Disp: , Rfl:    eszopiclone (LUNESTA) 2 MG TABS tablet, Take 2 mg by mouth at bedtime as needed., Disp: , Rfl:    levothyroxine (SYNTHROID) 100 MCG tablet, Take 1 tablet (100 mcg total) by mouth daily before breakfast. (Patient taking differently: Take 125 mcg by mouth daily before breakfast.), Disp: 90 tablet, Rfl: 0   lidocaine-prilocaine (EMLA) cream, Apply to affected area once, Disp: 30 g, Rfl: 3   lisinopril (ZESTRIL) 20 MG tablet, Take 20 mg by mouth daily., Disp: , Rfl:    lubiprostone (AMITIZA) 8 MCG capsule, TAKE 1 CAPSULE (8 MCG TOTAL) BY MOUTH 2 (TWO) TIMES DAILY WITH A MEAL., Disp: 180 capsule, Rfl: 2   mirtazapine (REMERON SOL-TAB) 15 MG disintegrating tablet, Take 15 mg by mouth daily., Disp: , Rfl:    NUVIGIL 250 MG tablet, Take 250 mg by mouth daily., Disp: , Rfl:    ondansetron (ZOFRAN) 8 MG tablet, Take 1 tablet (8 mg total) by  mouth 2 (two) times daily as needed for refractory nausea / vomiting. Start on day 3 after chemotherapy., Disp: 30 tablet, Rfl: 1   pantoprazole (PROTONIX) 40 MG tablet, Take 1 tablet by mouth daily., Disp: , Rfl:    pentoxifylline (TRENTAL) 400 MG CR tablet, , Disp: , Rfl:    pregabalin (LYRICA) 200 MG capsule, Take 1 capsule by mouth 3 (three) times daily., Disp: , Rfl:    prochlorperazine (COMPAZINE) 10 MG tablet, Take 1 tablet (10 mg total) by mouth every 6 (six) hours as needed (Nausea or vomiting)., Disp: 30 tablet, Rfl: 1   SODIUM FLUORIDE 5000 PPM 1.1 % PSTE, SMARTSIG:Sparingly Topical Every Night, Disp: , Rfl:   Allergies: No Known Allergies  Past Medical History, Surgical history, Social history, and Family History were  reviewed and updated.  Review of Systems: Review of Systems  Constitutional: Negative.   HENT:  Negative.    Eyes: Negative.   Respiratory: Negative.    Cardiovascular: Negative.   Gastrointestinal: Negative.   Endocrine: Negative.   Genitourinary: Negative.    Musculoskeletal: Negative.   Skin: Negative.   Neurological: Negative.   Hematological: Negative.   Psychiatric/Behavioral: Negative.     Physical Exam:  weight is 203 lb (92.1 kg). His oral temperature is 97.7 F (36.5 C). His blood pressure is 110/45 (abnormal) and his pulse is 70. His respiration is 17 and oxygen saturation is 99%.   Wt Readings from Last 3 Encounters:  12/04/20 203 lb (92.1 kg)  11/18/20 201 lb (91.2 kg)  11/05/20 199 lb (90.3 kg)    Physical Exam Vitals reviewed.  HENT:     Head: Normocephalic and atraumatic.  Eyes:     Pupils: Pupils are equal, round, and reactive to light.  Cardiovascular:     Rate and Rhythm: Normal rate and regular rhythm.     Heart sounds: Normal heart sounds.  Pulmonary:     Effort: Pulmonary effort is normal.     Breath sounds: Normal breath sounds.  Abdominal:     General: Bowel sounds are normal.     Palpations: Abdomen is soft.  Musculoskeletal:        General: No tenderness or deformity. Normal range of motion.     Cervical back: Normal range of motion.  Lymphadenopathy:     Cervical: No cervical adenopathy.  Skin:    General: Skin is warm and dry.     Findings: No erythema or rash.  Neurological:     Mental Status: He is alert and oriented to person, place, and time.  Psychiatric:        Behavior: Behavior normal.        Thought Content: Thought content normal.        Judgment: Judgment normal.     Lab Results  Component Value Date   WBC 1.5 (L) 12/04/2020   HGB 8.5 (L) 12/04/2020   HCT 24.6 (L) 12/04/2020   MCV 87.2 12/04/2020   PLT 144 (L) 12/04/2020     Chemistry      Component Value Date/Time   NA 136 12/04/2020 0910   K 2.3 (LL)  12/04/2020 0910   CL 101 12/04/2020 0910   CO2 26 12/04/2020 0910   BUN 10 12/04/2020 0910   CREATININE 0.97 12/04/2020 0910   CREATININE 0.96 09/05/2020 0000      Component Value Date/Time   CALCIUM 7.3 (L) 12/04/2020 0910   ALKPHOS 83 12/04/2020 0910   AST 27 12/04/2020 0910   ALT 26  12/04/2020 0910   BILITOT 0.4 12/04/2020 0910      Impression and Plan: Mr. Prout is a very nice 65 year old white male.  He has metastatic adenocarcinoma of the GE junction..  This will be his second cycle of chemotherapy with nivolumab.  Again, since the tumor is HER2 positive, we will proceed with Herceptin with this cycle.    I would hold off treatment until 18 July.  I really think we have to give him a little bit more time to recover.  Again we can increase his Synthroid up to 150 mcg daily.  I will put him on potassium at 40 mEq twice a day.  We will plan to see him back on 18 July.    [name], MD 7/7/202211:16 AM

## 2020-12-04 NOTE — Patient Instructions (Signed)
Hypokalemia Hypokalemia means that the amount of potassium in the blood is lower than normal. Potassium is a chemical (electrolyte) that helps regulate the amount of fluid in the body. It also stimulates muscle tightening (contraction) and helps nerves work properly. Normally, most of the body's potassium is inside cells, and only a very small amount is in the blood. Because the amount in the blood is so small, minorchanges to potassium levels in the blood can be life-threatening. What are the causes? This condition may be caused by: Antibiotic medicine. Diarrhea or vomiting. Taking too much of a medicine that helps you have a bowel movement (laxative) can cause diarrhea and lead to hypokalemia. Chronic kidney disease (CKD). Medicines that help the body get rid of excess fluid (diuretics). Eating disorders, such as bulimia. Low magnesium levels in the body. Sweating a lot. What are the signs or symptoms? Symptoms of this condition include: Weakness. Constipation. Fatigue. Muscle cramps. Mental confusion. Skipped heartbeats or irregular heartbeat (palpitations). Tingling or numbness. How is this diagnosed? This condition is diagnosed with a blood test. How is this treated? This condition may be treated by: Taking potassium supplements by mouth. Adjusting the medicines that you take. Eating more foods that contain a lot of potassium. If your potassium level is very low, you may need to get potassium through anIV and be monitored in the hospital. Follow these instructions at home:  Take over-the-counter and prescription medicines only as told by your health care provider. This includes vitamins and supplements. Eat a healthy diet. A healthy diet includes fresh fruits and vegetables, whole grains, healthy fats, and lean proteins. If instructed, eat more foods that contain a lot of potassium. This includes: Nuts, such as peanuts and pistachios. Seeds, such as sunflower seeds and pumpkin  seeds. Peas, lentils, and lima beans. Whole grain and bran cereals and breads. Fresh fruits and vegetables, such as apricots, avocado, bananas, cantaloupe, kiwi, oranges, tomatoes, asparagus, and potatoes. Orange juice. Tomato juice. Red meats. Yogurt. Keep all follow-up visits as told by your health care provider. This is important. Contact a health care provider if you: Have weakness that gets worse. Feel your heart pounding or racing. Vomit. Have diarrhea. Have diabetes (diabetes mellitus) and you have trouble keeping your blood sugar (glucose) in your target range. Get help right away if you: Have chest pain. Have shortness of breath. Have vomiting or diarrhea that lasts for more than 2 days. Faint. Summary Hypokalemia means that the amount of potassium in the blood is lower than normal. This condition is diagnosed with a blood test. Hypokalemia may be treated by taking potassium supplements, adjusting the medicines that you take, or eating more foods that are high in potassium. If your potassium level is very low, you may need to get potassium through an IV and be monitored in the hospital. This information is not intended to replace advice given to you by your health care provider. Make sure you discuss any questions you have with your healthcare provider. Document Revised: 12/28/2017 Document Reviewed: 12/28/2017 Elsevier Patient Education  2022 [employer]. Hypocalcemia, Adult Hypocalcemia is when the level of calcium in a person's blood is below normal. Calcium is a mineral that is used by the body in many ways. Not having enough blood calcium can affect the nervous system. This can lead to problems withmuscles, the heart, and the brain. What are the causes? This condition may be caused by: A deficiency of vitamin D or magnesium or both. Decreased levels of parathyroid hormone (  hypoparathyroidism). Kidney function problems. Low levels of a body protein called  albumin. Inflammation of the pancreas (pancreatitis). Not taking in enough vitamins and minerals in the diet or having intestinal problems that interfere with nutrient absorption. Certain medicines. What are the signs or symptoms? Some people may not have any symptoms, especially if they have long-term (chronic) hypocalcemia. Symptoms of this condition may include: Numbness and tingling in the fingers, toes, or around the mouth. Muscle twitching, aches, or cramps, especially in the legs, feet, and back. Spasm of the voice box (laryngospasm). This may make it difficult to breath or speak. Fast heartbeats (palpitations) and abnormal heart rhythms (arrhythmias). Shaking uncontrollably (seizures). Memory problems, confusion, or difficulty thinking. Depression, anxiety, irritability, or changes in personality. Long-term symptoms of this condition may include: Coarse, brittle hair and nails. Dry skin or lasting skin diseases (psoriasis, eczema, or dermatitis). Dental cavities. Clouding of the eye lens (cataracts). How is this diagnosed?  This condition is usually diagnosed with a blood test. You may also have other tests to help determine the underlying cause of the condition. This may includemore blood tests and imaging tests. How is this treated? This condition may be treated with: Calcium given by mouth (orally) or given through an IV. The method used for giving calcium will depend on the severity of the condition. If your condition is severe, you may need to be closely monitored in the hospital. Giving other minerals (electrolytes), such as magnesium. Other treatment will depend on the cause of the condition. Follow these instructions at home: Follow diet instructions from your health care provider or dietitian. Take supplements only as told by your health care provider. Keep all follow-up visits as told by your health care provider. This is important. Contact a health care provider if  you: Have increased muscle twitching or cramps. Have new swelling in the feet, ankles, or legs. Develop changes in mood, memory, or personality. Get help right away if you: Have chest pain. Have persistent rapid or irregular heartbeats. Have difficulty breathing. Faint. Start to have seizures. Have confusion. Summary Hypocalcemia is when the level of calcium in a person's blood is below normal. Not having enough blood calcium can affect the nervous system. This can lead to problems with muscles, the heart, and the brain. This condition may be treated with calcium given by mouth or through an IV, taking other minerals, and treating the underlying cause of hypocalcemia. Take supplements only as told by your health care provider. Contact a health care provider if you have new or worsening symptoms. Keep all follow-up visits as told by your health care provider. This is important. This information is not intended to replace advice given to you by your health care provider. Make sure you discuss any questions you have with your healthcare provider. Document Revised: 05/26/2018 Document Reviewed: 05/26/2018 Elsevier Patient Education  2022 [employer].

## 2020-12-05 ENCOUNTER — Telehealth: Payer: Self-pay | Admitting: *Deleted

## 2020-12-05 ENCOUNTER — Inpatient Hospital Stay: Payer: BC Managed Care – PPO

## 2020-12-05 ENCOUNTER — Encounter: Payer: Self-pay | Admitting: Hematology & Oncology

## 2020-12-05 ENCOUNTER — Encounter: Payer: Self-pay | Admitting: *Deleted

## 2020-12-05 LAB — T4: T4, Total: 7.4 ug/dL (ref 4.5–12.0)

## 2020-12-05 NOTE — Telephone Encounter (Signed)
Per 12/04/20 los called patient to confirm appointment that was previously made - patient confirmed.

## 2020-12-05 NOTE — Progress Notes (Signed)
Patient is unable to get treated today. Received symptom management.   Spoke with patient and confirmed that he received all necessary documentation for his disability claim. His claim has been completed and paid out.   Oncology Nurse Navigator Documentation  Oncology Nurse Navigator Flowsheets 12/04/2020  Abnormal Finding Date -  Confirmed Diagnosis Date -  Diagnosis Status -  Phase of Treatment -  Chemotherapy Actual Start Date: -  Chemotherapy Expected End Date: -  Navigator Follow Up Date: 12/15/2020  Navigator Follow Up Reason: Follow-up Appointment;Chemotherapy  Navigator Location [facility]  Navigator Encounter Type Treatment;Appt/Treatment Plan Review  Telephone -  Treatment Initiated Date -  Patient Visit Type MedOnc  Treatment Phase Active Tx  Barriers/Navigation Needs Coordination of Care;Education  Education -  Interventions Psycho-Social Support  Acuity Level 2-Minimal Needs (1-2 Barriers Identified)  Coordination of Care -  Education Method -  Support Groups/Services Friends and Family  Time Spent with Patient 30

## 2020-12-08 ENCOUNTER — Telehealth: Payer: Self-pay | Admitting: *Deleted

## 2020-12-08 ENCOUNTER — Inpatient Hospital Stay: Payer: BC Managed Care – PPO

## 2020-12-08 ENCOUNTER — Other Ambulatory Visit: Payer: Self-pay | Admitting: Pharmacist

## 2020-12-08 ENCOUNTER — Other Ambulatory Visit: Payer: Self-pay | Admitting: *Deleted

## 2020-12-08 ENCOUNTER — Other Ambulatory Visit: Payer: Self-pay | Admitting: Family

## 2020-12-08 ENCOUNTER — Other Ambulatory Visit: Payer: Self-pay

## 2020-12-08 DIAGNOSIS — C169 Malignant neoplasm of stomach, unspecified: Secondary | ICD-10-CM

## 2020-12-08 DIAGNOSIS — D649 Anemia, unspecified: Secondary | ICD-10-CM

## 2020-12-08 DIAGNOSIS — C160 Malignant neoplasm of cardia: Secondary | ICD-10-CM | POA: Diagnosis not present

## 2020-12-08 DIAGNOSIS — Z95828 Presence of other vascular implants and grafts: Secondary | ICD-10-CM

## 2020-12-08 DIAGNOSIS — D500 Iron deficiency anemia secondary to blood loss (chronic): Secondary | ICD-10-CM

## 2020-12-08 LAB — CBC WITH DIFFERENTIAL (CANCER CENTER ONLY)
Abs Immature Granulocytes: 0.12 10*3/uL — ABNORMAL HIGH (ref 0.00–0.07)
Basophils Absolute: 0 10*3/uL (ref 0.0–0.1)
Basophils Relative: 2 %
Eosinophils Absolute: 0.1 10*3/uL (ref 0.0–0.5)
Eosinophils Relative: 5 %
HCT: 26.4 % — ABNORMAL LOW (ref 39.0–52.0)
Hemoglobin: 8.6 g/dL — ABNORMAL LOW (ref 13.0–17.0)
Immature Granulocytes: 5 %
Lymphocytes Relative: 39 %
Lymphs Abs: 0.9 10*3/uL (ref 0.7–4.0)
MCH: 29.7 pg (ref 26.0–34.0)
MCHC: 32.6 g/dL (ref 30.0–36.0)
MCV: 91 fL (ref 80.0–100.0)
Monocytes Absolute: 0.6 10*3/uL (ref 0.1–1.0)
Monocytes Relative: 28 %
Neutro Abs: 0.5 10*3/uL — ABNORMAL LOW (ref 1.7–7.7)
Neutrophils Relative %: 21 %
Platelet Count: 184 10*3/uL (ref 150–400)
RBC: 2.9 MIL/uL — ABNORMAL LOW (ref 4.22–5.81)
RDW: 19.8 % — ABNORMAL HIGH (ref 11.5–15.5)
WBC Count: 2.2 10*3/uL — ABNORMAL LOW (ref 4.0–10.5)
nRBC: 0.9 % — ABNORMAL HIGH (ref 0.0–0.2)

## 2020-12-08 LAB — SAMPLE TO BLOOD BANK

## 2020-12-08 LAB — COMPREHENSIVE METABOLIC PANEL
ALT: 29 U/L (ref 0–44)
AST: 39 U/L (ref 15–41)
Albumin: 3.2 g/dL — ABNORMAL LOW (ref 3.5–5.0)
Alkaline Phosphatase: 87 U/L (ref 38–126)
Anion gap: 8 (ref 5–15)
BUN: 9 mg/dL (ref 8–23)
CO2: 25 mmol/L (ref 22–32)
Calcium: 6.8 mg/dL — ABNORMAL LOW (ref 8.9–10.3)
Chloride: 108 mmol/L (ref 98–111)
Creatinine, Ser: 0.92 mg/dL (ref 0.61–1.24)
GFR, Estimated: >60 mL/min (ref >60)
Glucose, Bld: 194 mg/dL — ABNORMAL HIGH (ref 70–99)
Potassium: 3.3 mmol/L — ABNORMAL LOW (ref 3.5–5.1)
Sodium: 141 mmol/L (ref 135–145)
Total Bilirubin: 0.3 mg/dL (ref 0.3–1.2)
Total Protein: 4.8 g/dL — ABNORMAL LOW (ref 6.5–8.1)

## 2020-12-08 LAB — PREPARE RBC (CROSSMATCH)

## 2020-12-08 LAB — ABO/RH: ABO/RH(D): O POS

## 2020-12-08 LAB — MAGNESIUM: Magnesium: 0.7 mg/dL — CL (ref 1.7–2.4)

## 2020-12-08 MED ORDER — HEPARIN SOD (PORK) LOCK FLUSH 100 UNIT/ML IV SOLN
500.0000 [IU] | Freq: Once | INTRAVENOUS | Status: AC
  Administered 2020-12-08: 500 [IU] via INTRAVENOUS
  Filled 2020-12-08: qty 5

## 2020-12-08 MED ORDER — MAGNESIUM SULFATE 2 GM/50ML IV SOLN
2.0000 g | Freq: Once | INTRAVENOUS | Status: AC
  Administered 2020-12-08: 2 g via INTRAVENOUS
  Filled 2020-12-08: qty 50

## 2020-12-08 MED ORDER — SODIUM CHLORIDE 0.9% FLUSH
10.0000 mL | Freq: Once | INTRAVENOUS | Status: AC
  Administered 2020-12-08: 10 mL via INTRAVENOUS
  Filled 2020-12-08: qty 10

## 2020-12-08 MED ORDER — SODIUM CHLORIDE 0.9 % IV SOLN
INTRAVENOUS | Status: DC
  Filled 2020-12-08: qty 250

## 2020-12-08 NOTE — Telephone Encounter (Signed)
Received a call from Patient stating that his legs and feet "have blown up like a balloon".  His hands have also started swelling this morning.  Dr Marin Olp notified.  Patient to come in today for labwork

## 2020-12-08 NOTE — Patient Instructions (Signed)
Implanted Port Home Guide An implanted port is a device that is placed under the skin. It is usually placed in the chest. The device can be used to give IV medicine, to take blood, or for dialysis. You may have an implanted port if: You need IV medicine that would be irritating to the small veins in your hands or arms. You need IV medicines, such as antibiotics, for a long period of time. You need IV nutrition for a long period of time. You need dialysis. When you have a port, your health care provider can choose to use the port instead of veins in your arms for these procedures. You may have fewer limitations when using a port than you would if you used other types of long-term IVs, and you will likely be able to return to normal activities afteryour incision heals. An implanted port has two main parts: Reservoir. The reservoir is the part where a needle is inserted to give medicines or draw blood. The reservoir is round. After it is placed, it appears as a small, raised area under your skin. Catheter. The catheter is a thin, flexible tube that connects the reservoir to a vein. Medicine that is inserted into the reservoir goes into the catheter and then into the vein. How is my port accessed? To access your port: A numbing cream may be placed on the skin over the port site. Your health care provider will put on a mask and sterile gloves. The skin over your port will be cleaned carefully with a germ-killing soap and allowed to dry. Your health care provider will gently pinch the port and insert a needle into it. Your health care provider will check for a blood return to make sure the port is in the vein and is not clogged. If your port needs to remain accessed to get medicine continuously (constant infusion), your health care provider will place a clear bandage (dressing) over the needle site. The dressing and needle will need to be changed every week, or as told by your health care provider. What  is flushing? Flushing helps keep the port from getting clogged. Follow instructions from your health care provider about how and when to flush the port. Ports are usually flushed with saline solution or a medicine called heparin. The need for flushing will depend on how the port is used: If the port is only used from time to time to give medicines or draw blood, the port may need to be flushed: Before and after medicines have been given. Before and after blood has been drawn. As part of routine maintenance. Flushing may be recommended every 4-6 weeks. If a constant infusion is running, the port may not need to be flushed. Throw away any syringes in a disposal container that is meant for sharp items (sharps container). You can buy a sharps container from a pharmacy, or you can make one by using an empty hard plastic bottle with a cover. How long will my port stay implanted? The port can stay in for as long as your health care provider thinks it is needed. When it is time for the port to come out, a surgery will be done to remove it. The surgery will be similar to the procedure that was done to putthe port in. Follow these instructions at home:  Flush your port as told by your health care provider. If you need an infusion over several days, follow instructions from your health care provider about how to take   care of your port site. Make sure you: Wash your hands with soap and water before you change your dressing. If soap and water are not available, use alcohol-based hand sanitizer. Change your dressing as told by your health care provider. Place any used dressings or infusion bags into a plastic bag. Throw that bag in the trash. Keep the dressing that covers the needle clean and dry. Do not get it wet. Do not use scissors or sharp objects near the tube. Keep the tube clamped, unless it is being used. Check your port site every day for signs of infection. Check for: Redness, swelling, or  pain. Fluid or blood. Pus or a bad smell. Protect the skin around the port site. Avoid wearing bra straps that rub or irritate the site. Protect the skin around your port from seat belts. Place a soft pad over your chest if needed. Bathe or shower as told by your health care provider. The site may get wet as long as you are not actively receiving an infusion. Return to your normal activities as told by your health care provider. Ask your health care provider what activities are safe for you. Carry a medical alert card or wear a medical alert bracelet at all times. This will let health care providers know that you have an implanted port in case of an emergency. Get help right away if: You have redness, swelling, or pain at the port site. You have fluid or blood coming from your port site. You have pus or a bad smell coming from the port site. You have a fever. Summary Implanted ports are usually placed in the chest for long-term IV access. Follow instructions from your health care provider about flushing the port and changing bandages (dressings). Take care of the area around your port by avoiding clothing that puts pressure on the area, and by watching for signs of infection. Protect the skin around your port from seat belts. Place a soft pad over your chest if needed. Get help right away if you have a fever or you have redness, swelling, pain, drainage, or a bad smell at the port site. This information is not intended to replace advice given to you by your health care provider. Make sure you discuss any questions you have with your healthcare provider. Document Revised: 10/01/2019 Document Reviewed: 10/01/2019 Elsevier Patient Education  2022 [employer]  

## 2020-12-08 NOTE — Telephone Encounter (Signed)
Received notification of Magnesium .7, Hgb 8.6.  Dr Marin Olp notified.  1 u PRBC ordered and 2 gms of Magnesium to be given today.

## 2020-12-09 ENCOUNTER — Other Ambulatory Visit: Payer: Self-pay | Admitting: *Deleted

## 2020-12-09 DIAGNOSIS — D649 Anemia, unspecified: Secondary | ICD-10-CM

## 2020-12-09 LAB — TSH: TSH: 20.202 u[IU]/mL — ABNORMAL HIGH (ref 0.320–4.118)

## 2020-12-09 LAB — T4: T4, Total: 7.4 ug/dL (ref 4.5–12.0)

## 2020-12-10 ENCOUNTER — Other Ambulatory Visit: Payer: Self-pay

## 2020-12-10 ENCOUNTER — Inpatient Hospital Stay: Payer: BC Managed Care – PPO

## 2020-12-10 ENCOUNTER — Other Ambulatory Visit: Payer: Self-pay | Admitting: *Deleted

## 2020-12-10 VITALS — BP 137/71 | HR 74 | Temp 97.7°F | Resp 17

## 2020-12-10 DIAGNOSIS — D649 Anemia, unspecified: Secondary | ICD-10-CM

## 2020-12-10 DIAGNOSIS — C169 Malignant neoplasm of stomach, unspecified: Secondary | ICD-10-CM

## 2020-12-10 DIAGNOSIS — D500 Iron deficiency anemia secondary to blood loss (chronic): Secondary | ICD-10-CM

## 2020-12-10 DIAGNOSIS — C160 Malignant neoplasm of cardia: Secondary | ICD-10-CM | POA: Diagnosis not present

## 2020-12-10 LAB — COMPREHENSIVE METABOLIC PANEL
ALT: 29 U/L (ref 0–44)
AST: 36 U/L (ref 15–41)
Albumin: 3.3 g/dL — ABNORMAL LOW (ref 3.5–5.0)
Alkaline Phosphatase: 95 U/L (ref 38–126)
Anion gap: 6 (ref 5–15)
BUN: 9 mg/dL (ref 8–23)
CO2: 27 mmol/L (ref 22–32)
Calcium: 7.6 mg/dL — ABNORMAL LOW (ref 8.9–10.3)
Chloride: 105 mmol/L (ref 98–111)
Creatinine, Ser: 0.85 mg/dL (ref 0.61–1.24)
GFR, Estimated: >60 mL/min (ref >60)
Glucose, Bld: 161 mg/dL — ABNORMAL HIGH (ref 70–99)
Potassium: 4 mmol/L (ref 3.5–5.1)
Sodium: 138 mmol/L (ref 135–145)
Total Bilirubin: 0.3 mg/dL (ref 0.3–1.2)
Total Protein: 5.1 g/dL — ABNORMAL LOW (ref 6.5–8.1)

## 2020-12-10 LAB — MAGNESIUM: Magnesium: 1.2 mg/dL — ABNORMAL LOW (ref 1.7–2.4)

## 2020-12-10 MED ORDER — FUROSEMIDE 10 MG/ML IJ SOLN
INTRAMUSCULAR | Status: AC
  Filled 2020-12-10: qty 4

## 2020-12-10 MED ORDER — SODIUM CHLORIDE 0.9 % IV SOLN
Freq: Once | INTRAVENOUS | Status: AC
Start: 2020-12-10 — End: 2020-12-10
  Filled 2020-12-10: qty 250

## 2020-12-10 MED ORDER — SODIUM CHLORIDE 0.9% FLUSH
10.0000 mL | Freq: Once | INTRAVENOUS | Status: AC
  Administered 2020-12-10: 10 mL via INTRAVENOUS
  Filled 2020-12-10: qty 10

## 2020-12-10 MED ORDER — HEPARIN SOD (PORK) LOCK FLUSH 100 UNIT/ML IV SOLN
250.0000 [IU] | INTRAVENOUS | Status: DC | PRN
  Filled 2020-12-10: qty 5

## 2020-12-10 MED ORDER — FUROSEMIDE 10 MG/ML IJ SOLN
20.0000 mg | Freq: Once | INTRAMUSCULAR | Status: DC
Start: 2020-12-10 — End: 2020-12-10

## 2020-12-10 MED ORDER — SODIUM CHLORIDE 0.9% IV SOLUTION
250.0000 mL | Freq: Once | INTRAVENOUS | Status: DC
  Filled 2020-12-10: qty 250

## 2020-12-10 MED ORDER — HEPARIN SOD (PORK) LOCK FLUSH 100 UNIT/ML IV SOLN
500.0000 [IU] | Freq: Once | INTRAVENOUS | Status: AC
  Administered 2020-12-10: 500 [IU] via INTRAVENOUS
  Filled 2020-12-10: qty 5

## 2020-12-10 MED ORDER — DIPHENHYDRAMINE HCL 25 MG PO CAPS: 25.0000 mg | ORAL_CAPSULE | Freq: Once | ORAL | Status: DC

## 2020-12-10 MED ORDER — MAGNESIUM SULFATE 2 GM/50ML IV SOLN
2.0000 g | Freq: Once | INTRAVENOUS | Status: AC
  Administered 2020-12-10: 2 g via INTRAVENOUS
  Filled 2020-12-10: qty 50

## 2020-12-10 MED ORDER — SODIUM CHLORIDE 0.9% FLUSH
10.0000 mL | INTRAVENOUS | Status: DC | PRN
Start: 2020-12-10 — End: 2020-12-10
  Filled 2020-12-10: qty 10

## 2020-12-10 MED ORDER — FUROSEMIDE 10 MG/ML IJ SOLN
20.0000 mg | Freq: Once | INTRAMUSCULAR | Status: AC
  Administered 2020-12-10: 20 mg via INTRAVENOUS

## 2020-12-10 MED ORDER — ACETAMINOPHEN 325 MG PO TABS
650.0000 mg | ORAL_TABLET | Freq: Once | ORAL | Status: DC
Start: 2020-12-10 — End: 2020-12-10

## 2020-12-10 NOTE — Patient Instructions (Signed)
https://www.redcrossblood.org/donate-blood/blood-donation-process/what-happens-to-donated-blood/blood-transfusions/types-of-blood-transfusions.html"> https://www.hematology.org/education/patients/blood-basics/blood-safety-and-matching"> https://www.nhlbi.nih.gov/health-topics/blood-transfusion">  Blood Transfusion, Adult A blood transfusion is a procedure in which you receive blood or a type of blood cell (blood component) through an IV. You may need a blood transfusion when your blood level is low. This may result from a bleeding disorder, illness, injury, or surgery. The blood may come from a donor. You may also be able to donate blood for yourself (autologous blood donation) before a planned surgery. The blood given in a transfusion is made up of different blood components. You may receive: Red blood cells. These carry oxygen to the cells in the body. Platelets. These help your blood to clot. Plasma. This is the liquid part of your blood. It carries proteins and other substances throughout the body. White blood cells. These help you fight infections. If you have hemophilia or another clotting disorder, you may also receive othertypes of blood products. Tell a health care provider about: Any blood disorders you have. Any previous reactions you have had during a blood transfusion. Any allergies you have. All medicines you are taking, including vitamins, herbs, eye drops, creams, and over-the-counter medicines. Any surgeries you have had. Any medical conditions you have, including any recent fever or cold symptoms. Whether you are pregnant or may be pregnant. What are the risks? Generally, this is a safe procedure. However, problems may occur. The most common problems include: A mild allergic reaction, such as red, swollen areas of skin (hives) and itching. Fever or chills. This may be the body's response to new blood cells received. This may occur during or up to 4 hours after the  transfusion. More serious problems may include: Transfusion-associated circulatory overload (TACO), or too much fluid in the lungs. This may cause breathing problems. A serious allergic reaction, such as difficulty breathing or swelling around the face and lips. Transfusion-related acute lung injury (TRALI), which causes breathing difficulty and low oxygen in the blood. This can occur within hours of the transfusion or several days later. Iron overload. This can happen after receiving many blood transfusions over a period of time. Infection or virus being transmitted. This is rare because donated blood is carefully tested before it is given. Hemolytic transfusion reaction. This is rare. It happens when your body's defense system (immune system)tries to attack the new blood cells. Symptoms may include fever, chills, nausea, low blood pressure, and low back or chest pain. Transfusion-associated graft-versus-host disease (TAGVHD). This is rare. It happens when donated cells attack your body's healthy tissues. What happens before the procedure? Medicines Ask your health care provider about: Changing or stopping your regular medicines. This is especially important if you are taking diabetes medicines or blood thinners. Taking medicines such as aspirin and ibuprofen. These medicines can thin your blood. Do not take these medicines unless your health care provider tells you to take them. Taking over-the-counter medicines, vitamins, herbs, and supplements. General instructions Follow instructions from your health care provider about eating and drinking restrictions. You will have a blood test to determine your blood type. This is necessary to know what kind of blood your body will accept and to match it to the donor blood. If you are going to have a planned surgery, you may be able to do an autologous blood donation. This may be done in case you need to have a transfusion. You will have your temperature,  blood pressure, and pulse monitored before the transfusion. If you have had an allergic reaction to a transfusion in the past, you may be given   medicine to help prevent a reaction. This medicine may be given to you by mouth (orally) or through an IV. Set aside time for the blood transfusion. This procedure generally takes 1-4 hours to complete. What happens during the procedure?  An IV will be inserted into one of your veins. The bag of donated blood will be attached to your IV. The blood will then enter through your vein. Your temperature, blood pressure, and pulse will be monitored regularly during the transfusion. This monitoring is done to detect early signs of a transfusion reaction. Tell your nurse right away if you have any of these symptoms during the transfusion: Shortness of breath or trouble breathing. Chest or back pain. Fever or chills. Hives or itching. If you have any signs or symptoms of a reaction, your transfusion will be stopped and you may be given medicine. When the transfusion is complete, your IV will be removed. Pressure may be applied to the IV site for a few minutes. A bandage (dressing)will be applied. The procedure may vary among health care providers and hospitals. What happens after the procedure? Your temperature, blood pressure, pulse, breathing rate, and blood oxygen level will be monitored until you leave the hospital or clinic. Your blood may be tested to see how you are responding to the transfusion. You may be warmed with fluids or blankets to maintain a normal body temperature. If you receive your blood transfusion in an outpatient setting, you will be told whom to contact to report any reactions. Where to find more information For more information on blood transfusions, visit the American Red Cross: redcross.org Summary A blood transfusion is a procedure in which you receive blood or a type of blood cell (blood component) through an IV. The blood you  receive may come from a donor or be donated by yourself (autologous blood donation) before a planned surgery. The blood given in a transfusion is made up of different blood components. You may receive red blood cells, platelets, plasma, or white blood cells depending on the condition treated. Your temperature, blood pressure, and pulse will be monitored before, during, and after the transfusion. After the transfusion, your blood may be tested to see how your body has responded. This information is not intended to replace advice given to you by your health care provider. Make sure you discuss any questions you have with your healthcare provider. Document Revised: 03/22/2019 Document Reviewed: 11/09/2018 Elsevier Patient Education  2022 Elsevier Inc.  

## 2020-12-10 NOTE — Patient Instructions (Signed)

## 2020-12-11 LAB — BPAM RBC
Blood Product Expiration Date: 202208112359
ISSUE DATE / TIME: 202207130806
Unit Type and Rh: 5100

## 2020-12-11 LAB — TYPE AND SCREEN
ABO/RH(D): O POS
Antibody Screen: NEGATIVE
Unit division: 0

## 2020-12-13 ENCOUNTER — Other Ambulatory Visit: Payer: Self-pay | Admitting: Family Medicine

## 2020-12-15 ENCOUNTER — Inpatient Hospital Stay (HOSPITAL_BASED_OUTPATIENT_CLINIC_OR_DEPARTMENT_OTHER): Payer: BC Managed Care – PPO | Admitting: Hematology & Oncology

## 2020-12-15 ENCOUNTER — Inpatient Hospital Stay: Payer: BC Managed Care – PPO

## 2020-12-15 ENCOUNTER — Encounter: Payer: Self-pay | Admitting: Hematology & Oncology

## 2020-12-15 ENCOUNTER — Other Ambulatory Visit: Payer: Self-pay

## 2020-12-15 ENCOUNTER — Telehealth: Payer: Self-pay

## 2020-12-15 VITALS — BP 125/64 | HR 98 | Temp 98.7°F | Resp 18 | Wt 203.0 lb

## 2020-12-15 DIAGNOSIS — C160 Malignant neoplasm of cardia: Secondary | ICD-10-CM

## 2020-12-15 DIAGNOSIS — D649 Anemia, unspecified: Secondary | ICD-10-CM

## 2020-12-15 LAB — CMP (CANCER CENTER ONLY)
ALT: 24 U/L (ref 0–44)
AST: 26 U/L (ref 15–41)
Albumin: 3.9 g/dL (ref 3.5–5.0)
Alkaline Phosphatase: 111 U/L (ref 38–126)
Anion gap: 7 (ref 5–15)
BUN: 16 mg/dL (ref 8–23)
CO2: 27 mmol/L (ref 22–32)
Calcium: 9.2 mg/dL (ref 8.9–10.3)
Chloride: 102 mmol/L (ref 98–111)
Creatinine: 1.05 mg/dL (ref 0.61–1.24)
GFR, Estimated: >60 mL/min (ref >60)
Glucose, Bld: 143 mg/dL — ABNORMAL HIGH (ref 70–99)
Potassium: 4.7 mmol/L (ref 3.5–5.1)
Sodium: 136 mmol/L (ref 135–145)
Total Bilirubin: 0.4 mg/dL (ref 0.3–1.2)
Total Protein: 6.5 g/dL (ref 6.5–8.1)

## 2020-12-15 LAB — CBC WITH DIFFERENTIAL (CANCER CENTER ONLY)
Abs Immature Granulocytes: 0.11 10*3/uL — ABNORMAL HIGH (ref 0.00–0.07)
Basophils Absolute: 0.1 10*3/uL (ref 0.0–0.1)
Basophils Relative: 2 %
Eosinophils Absolute: 0.1 10*3/uL (ref 0.0–0.5)
Eosinophils Relative: 1 %
HCT: 34.3 % — ABNORMAL LOW (ref 39.0–52.0)
Hemoglobin: 11.1 g/dL — ABNORMAL LOW (ref 13.0–17.0)
Immature Granulocytes: 2 %
Lymphocytes Relative: 16 %
Lymphs Abs: 1 10*3/uL (ref 0.7–4.0)
MCH: 30.1 pg (ref 26.0–34.0)
MCHC: 32.4 g/dL (ref 30.0–36.0)
MCV: 93 fL (ref 80.0–100.0)
Monocytes Absolute: 0.9 10*3/uL (ref 0.1–1.0)
Monocytes Relative: 14 %
Neutro Abs: 4.1 10*3/uL (ref 1.7–7.7)
Neutrophils Relative %: 65 %
Platelet Count: 180 10*3/uL (ref 150–400)
RBC: 3.69 MIL/uL — ABNORMAL LOW (ref 4.22–5.81)
RDW: 20.2 % — ABNORMAL HIGH (ref 11.5–15.5)
WBC Count: 6.2 10*3/uL (ref 4.0–10.5)
nRBC: 0 % (ref 0.0–0.2)

## 2020-12-15 LAB — TYPE AND SCREEN
ABO/RH(D): O POS
Antibody Screen: NEGATIVE

## 2020-12-15 LAB — FERRITIN: Ferritin: 49 ng/mL (ref 24–336)

## 2020-12-15 LAB — LACTATE DEHYDROGENASE: LDH: 275 U/L — ABNORMAL HIGH (ref 98–192)

## 2020-12-15 LAB — IRON AND TIBC
Iron: 106 ug/dL (ref 42–163)
Saturation Ratios: 25 % (ref 20–55)
TIBC: 426 ug/dL — ABNORMAL HIGH (ref 202–409)
UIBC: 320 ug/dL (ref 117–376)

## 2020-12-15 LAB — TSH: TSH: 3.551 u[IU]/mL (ref 0.320–4.118)

## 2020-12-15 MED ORDER — PALONOSETRON HCL INJECTION 0.25 MG/5ML
0.2500 mg | Freq: Once | INTRAVENOUS | Status: AC
  Administered 2020-12-15: 0.25 mg via INTRAVENOUS

## 2020-12-15 MED ORDER — ACETAMINOPHEN 325 MG PO TABS
650.0000 mg | ORAL_TABLET | Freq: Once | ORAL | Status: AC
  Administered 2020-12-15: 650 mg via ORAL

## 2020-12-15 MED ORDER — TRASTUZUMAB-DKST CHEMO 150 MG IV SOLR
6.0000 mg/kg | Freq: Once | INTRAVENOUS | Status: AC
  Administered 2020-12-15: 546 mg via INTRAVENOUS
  Filled 2020-12-15: qty 26

## 2020-12-15 MED ORDER — LEUCOVORIN CALCIUM INJECTION 350 MG
400.0000 mg/m2 | Freq: Once | INTRAVENOUS | Status: AC
  Administered 2020-12-15: 864 mg via INTRAVENOUS
  Filled 2020-12-15: qty 43.2

## 2020-12-15 MED ORDER — DIPHENHYDRAMINE HCL 25 MG PO CAPS
ORAL_CAPSULE | ORAL | Status: AC
  Filled 2020-12-15: qty 1

## 2020-12-15 MED ORDER — SODIUM CHLORIDE 0.9 % IV SOLN
10.0000 mg | Freq: Once | INTRAVENOUS | Status: AC
  Administered 2020-12-15: 10 mg via INTRAVENOUS
  Filled 2020-12-15: qty 10

## 2020-12-15 MED ORDER — OXALIPLATIN CHEMO INJECTION 100 MG/20ML
85.0000 mg/m2 | Freq: Once | INTRAVENOUS | Status: AC
  Administered 2020-12-15: 185 mg via INTRAVENOUS
  Filled 2020-12-15: qty 37

## 2020-12-15 MED ORDER — DEXTROSE 5 % IV SOLN
Freq: Once | INTRAVENOUS | Status: AC
  Filled 2020-12-15: qty 250

## 2020-12-15 MED ORDER — PALONOSETRON HCL INJECTION 0.25 MG/5ML
INTRAVENOUS | Status: AC
  Filled 2020-12-15: qty 5

## 2020-12-15 MED ORDER — FLUOROURACIL CHEMO INJECTION 2.5 GM/50ML
400.0000 mg/m2 | Freq: Once | INTRAVENOUS | Status: AC
  Administered 2020-12-15: 850 mg via INTRAVENOUS
  Filled 2020-12-15: qty 17

## 2020-12-15 MED ORDER — DIPHENHYDRAMINE HCL 25 MG PO CAPS
50.0000 mg | ORAL_CAPSULE | Freq: Once | ORAL | Status: AC
  Administered 2020-12-15: 50 mg via ORAL

## 2020-12-15 MED ORDER — SODIUM CHLORIDE 0.9 % IV SOLN
2325.0000 mg/m2 | INTRAVENOUS | Status: DC
  Administered 2020-12-15: 5000 mg via INTRAVENOUS
  Filled 2020-12-15: qty 100

## 2020-12-15 MED ORDER — SODIUM CHLORIDE 0.9 % IV SOLN
Freq: Once | INTRAVENOUS | Status: AC
  Filled 2020-12-15: qty 250

## 2020-12-15 NOTE — Progress Notes (Signed)
Day 15 delayed due to patient complications. Will give nivolumab with day 1 of next cycle per Dr. Marin Olp.

## 2020-12-15 NOTE — Progress Notes (Signed)
Reload trastuzumab per Dr. Marin Olp. Dose changed to loading dose today per his instructions.

## 2020-12-15 NOTE — Patient Instructions (Signed)
[facility]  Discharge Instructions: Thank you for choosing [facility] to provide your oncology and hematology care.   If you have a lab appointment with the [facility], please go directly to the [facility] and check in at the registration area.  Wear comfortable clothing and clothing appropriate for easy access to any Portacath or PICC line.   We strive to give you quality time with your provider. You may need to reschedule your appointment if you arrive late (15 or more minutes).  Arriving late affects you and other patients whose appointments are after yours.  Also, if you miss three or more appointments without notifying the office, you may be dismissed from the clinic at the provider's discretion.      For prescription refill requests, have your pharmacy contact our office and allow 72 hours for refills to be completed.    Today you received the following chemotherapy and/or immunotherapy agents 5FU, Oxaliplatin, Herceptin, Opdivo,       To help prevent nausea and vomiting after your treatment, we encourage you to take your nausea medication as directed.  BELOW ARE SYMPTOMS THAT SHOULD BE REPORTED IMMEDIATELY: *FEVER GREATER THAN 100.4 F (38 C) OR HIGHER *CHILLS OR SWEATING *NAUSEA AND VOMITING THAT IS NOT CONTROLLED WITH YOUR NAUSEA MEDICATION *UNUSUAL SHORTNESS OF BREATH *UNUSUAL BRUISING OR BLEEDING *URINARY PROBLEMS (pain or burning when urinating, or frequent urination) *BOWEL PROBLEMS (unusual diarrhea, constipation, pain near the anus) TENDERNESS IN MOUTH AND THROAT WITH OR WITHOUT PRESENCE OF ULCERS (sore throat, sores in mouth, or a toothache) UNUSUAL RASH, SWELLING OR PAIN  UNUSUAL VAGINAL DISCHARGE OR ITCHING   Items with * indicate a potential emergency and should be followed up as soon as possible or go to the Emergency Department if any problems should occur.  Please show the CHEMOTHERAPY ALERT CARD or IMMUNOTHERAPY  ALERT CARD at check-in to the Emergency Department and triage nurse. Should you have questions after your visit or need to cancel or reschedule your appointment, please contact [facility]  [phone] and follow the prompts.  Office hours are 8:00 a.m. to 4:30 p.m. Monday - Friday. Please note that voicemails left after 4:00 p.m. may not be returned until the following business day.  We are closed weekends and major holidays. You have access to a nurse at all times for urgent questions. Please call the main number to the clinic [phone] and follow the prompts.  For any non-urgent questions, you may also contact your provider using MyChart. We now offer e-Visits for anyone 26 and older to request care online for non-urgent symptoms. For details visit [email or web address].   Also download the MyChart app! Go to the app store, search "MyChart", open the app, select [facility], and log in with your MyChart username and password.  Due to Covid, a mask is required upon entering the hospital/clinic. If you do not have a mask, one will be given to you upon arrival. For doctor visits, patients may have 1 support person aged 63 or older with them. For treatment visits, patients cannot have anyone with them due to current Covid guidelines and our immunocompromised population.

## 2020-12-15 NOTE — Progress Notes (Signed)
Hematology and Oncology Follow Up Visit  Eddie Hernandez [id number] 01-29-1956 65 y.o. 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  He has she is doing much better.  We saw him last week.  He was having a lot of problems.  His potassium was incredibly low.  90 was was very low.  He needed to have a transfusion.  He is doing much better now.  His potassium has normalized.  I told him to decrease the oral potassium that he is taking at home.  He will take 20 mill equivalents daily now.  He has had no problems with bowels or bladder.  There is no diarrhea.  He has had no problems with bleeding.  He has had no pain.  There is no cough.  He has had problems with hypothyroidism.  He is on Synthroid.  His TSH today was down to 3.6.  His iron studies today show a ferritin of 49 with an iron saturation of 29%.  I think we can go ahead with his treatment today.  This will be his third cycle of treatment.     Medications:  Current Outpatient Medications:    acetaminophen (TYLENOL) 500 MG tablet, Take 1,000 mg by mouth every 6 (six) hours as needed., Disp: , Rfl:    ARIPiprazole (ABILIFY) 5 MG tablet, TAKE ONE TABLET (5 MG TOTAL) BY MOUTH DAILY, Disp: 30 tablet, Rfl: 3   cilostazol (PLETAL) 100 MG tablet, Take 100 mg by mouth 2 (two) times daily., Disp: , Rfl:    dexamethasone (DECADRON) 4 MG tablet, Take 2 tablets (8 mg total) by mouth daily. Start the day after chemotherapy for 2 days. Take with food., Disp: 30 tablet, Rfl: 1   dicyclomine (BENTYL) 10 MG capsule, TAKE 1 CAPSULE (10 MG TOTAL) BY MOUTH 3 (THREE) TIMES DAILY BEFORE MEALS., Disp: 90 capsule, Rfl: 0   diphenoxylate-atropine (LOMOTIL) 2.5-0.025 MG tablet, Take 2 tablets by mouth 4 (four) times daily as needed for diarrhea or loose  stools., Disp: 100 tablet, Rfl: 0   DULoxetine (CYMBALTA) 60 MG capsule, Take 60 mg by mouth daily., Disp: , Rfl:    eszopiclone (LUNESTA) 2 MG TABS tablet, Take 2 mg by mouth at bedtime as needed., Disp: , Rfl:    levothyroxine (SYNTHROID) 150 MCG tablet, Take 1 tablet (150 mcg total) by mouth daily before breakfast., Disp: 30 tablet, Rfl: 4   lisinopril (ZESTRIL) 20 MG tablet, Take 20 mg by mouth daily., Disp: , Rfl:    mirtazapine (REMERON SOL-TAB) 15 MG disintegrating tablet, Take 15 mg by mouth daily., Disp: , Rfl:    NUVIGIL 250 MG tablet, Take 250 mg by mouth daily., Disp: , Rfl:    pantoprazole (PROTONIX) 40 MG tablet, Take 1 tablet by mouth daily., Disp: , Rfl:    potassium chloride SA (KLOR-CON) 20 MEQ tablet, Take 2 tablets (40 mEq total) by mouth 2 (two) times daily., Disp: 120 tablet, Rfl: 3   prednisoLONE acetate (PRED FORTE) 1 % ophthalmic suspension, 1 drop 2 (two) times daily., Disp: , Rfl:    pregabalin (LYRICA) 200 MG capsule, Take 1 capsule by mouth 3 (three) times daily., Disp: , Rfl:    SODIUM FLUORIDE 5000 PPM 1.1 % PSTE, SMARTSIG:Sparingly Topical Every Night, Disp: , Rfl:    lidocaine-prilocaine (EMLA)  cream, Apply to affected area once (Patient not taking: Reported on 12/15/2020), Disp: 30 g, Rfl: 3   lubiprostone (AMITIZA) 8 MCG capsule, TAKE 1 CAPSULE (8 MCG TOTAL) BY MOUTH 2 (TWO) TIMES DAILY WITH A MEAL. (Patient not taking: Reported on 12/15/2020), Disp: 180 capsule, Rfl: 2   ondansetron (ZOFRAN) 8 MG tablet, Take 1 tablet (8 mg total) by mouth 2 (two) times daily as needed for refractory nausea / vomiting. Start on day 3 after chemotherapy. (Patient not taking: Reported on 12/15/2020), Disp: 30 tablet, Rfl: 1   pentoxifylline (TRENTAL) 400 MG CR tablet, Take by mouth., Disp: , Rfl:    prochlorperazine (COMPAZINE) 10 MG tablet, Take 1 tablet (10 mg total) by mouth every 6 (six) hours as needed (Nausea or vomiting). (Patient not taking: Reported on 12/15/2020), Disp: 30  tablet, Rfl: 1 No current facility-administered medications for this visit.  Facility-Administered Medications Ordered in Other Visits:    dexamethasone (DECADRON) 10 mg in sodium chloride 0.9 % 50 mL IVPB, 10 mg, Intravenous, Once, Gionna Polak, Rudell Cobb, MD   dextrose 5 % solution, , Intravenous, Once, Antino Mayabb, Rudell Cobb, MD   dextrose 5 % solution, , Intravenous, Once, Levina Boyack, Rudell Cobb, MD   fluorouracil (ADRUCIL) 5,000 mg in sodium chloride 0.9 % 150 mL chemo infusion, 2,325 mg/m2 (Treatment Plan Recorded), Intravenous, 1 day or 1 dose, Lisel Siegrist, Rudell Cobb, MD   fluorouracil (ADRUCIL) chemo injection 850 mg, 400 mg/m2 (Treatment Plan Recorded), Intravenous, Once, Eden Toohey, Rudell Cobb, MD   leucovorin 864 mg in dextrose 5 % 250 mL infusion, 400 mg/m2 (Treatment Plan Recorded), Intravenous, Once, Kenzi Bardwell, Rudell Cobb, MD   oxaliplatin (ELOXATIN) 185 mg in dextrose 5 % 500 mL chemo infusion, 85 mg/m2 (Treatment Plan Recorded), Intravenous, Once, Jc Veron, Rudell Cobb, MD   trastuzumab-dkst (OGIVRI) 546 mg in sodium chloride 0.9 % 250 mL chemo infusion, 6 mg/kg (Treatment Plan Recorded), Intravenous, Once, Tenya Araque, Rudell Cobb, MD  Allergies: No Known Allergies  Past Medical History, Surgical history, Social history, and Family History were reviewed and updated.  Review of Systems: Review of Systems  Constitutional: Negative.   HENT:  Negative.    Eyes: Negative.   Respiratory: Negative.    Cardiovascular: Negative.   Gastrointestinal: Negative.   Endocrine: Negative.   Genitourinary: Negative.    Musculoskeletal: Negative.   Skin: Negative.   Neurological: Negative.   Hematological: Negative.   Psychiatric/Behavioral: Negative.     Physical Exam:  weight is 203 lb (92.1 kg). His oral temperature is 98.7 F (37.1 C). His blood pressure is 125/64 and his pulse is 98. His respiration is 18 and oxygen saturation is 97%.   Wt Readings from Last 3 Encounters:  12/15/20 203 lb (92.1 kg)  12/04/20 203 lb (92.1  kg)  11/18/20 201 lb (91.2 kg)    Physical Exam Vitals reviewed.  HENT:     Head: Normocephalic and atraumatic.  Eyes:     Pupils: Pupils are equal, round, and reactive to light.  Cardiovascular:     Rate and Rhythm: Normal rate and regular rhythm.     Heart sounds: Normal heart sounds.  Pulmonary:     Effort: Pulmonary effort is normal.     Breath sounds: Normal breath sounds.  Abdominal:     General: Bowel sounds are normal.     Palpations: Abdomen is soft.  Musculoskeletal:        General: No tenderness or deformity. Normal range of motion.     Cervical back: Normal range of  motion.  Lymphadenopathy:     Cervical: No cervical adenopathy.  Skin:    General: Skin is warm and dry.     Findings: No erythema or rash.  Neurological:     Mental Status: He is alert and oriented to person, place, and time.  Psychiatric:        Behavior: Behavior normal.        Thought Content: Thought content normal.        Judgment: Judgment normal.     Lab Results  Component Value Date   WBC 6.2 12/15/2020   HGB 11.1 (L) 12/15/2020   HCT 34.3 (L) 12/15/2020   MCV 93.0 12/15/2020   PLT 180 12/15/2020     Chemistry      Component Value Date/Time   NA 136 12/15/2020 0929   K 4.7 12/15/2020 0929   CL 102 12/15/2020 0929   CO2 27 12/15/2020 0929   BUN 16 12/15/2020 0929   CREATININE 1.05 12/15/2020 0929   CREATININE 0.96 09/05/2020 0000      Component Value Date/Time   CALCIUM 9.2 12/15/2020 0929   ALKPHOS 111 12/15/2020 0929   AST 26 12/15/2020 0929   ALT 24 12/15/2020 0929   BILITOT 0.4 12/15/2020 0929      Impression and Plan: Mr. Christoffel is a very nice 65 year old white male.  He has metastatic adenocarcinoma of the GE junction..  This will be his third cycle of chemotherapy with Herceptin/nivolumab.    I we will reevaluate him after his fourth cycle of treatment.  Hopefully, he will have had a good response.  We will plan to get him back in another couple  weeks.  Hopefully, we will not make any count of dosage adjustments.  He is overall young and healthy.  I really want him to try to be aggressive with this malignancy.     [name], MD 7/18/202211:24 AM

## 2020-12-15 NOTE — Telephone Encounter (Signed)
Appts made per 12/15/20 los, pt to gain sch at [email or web address]-avs and through [employer]

## 2020-12-15 NOTE — Patient Instructions (Signed)

## 2020-12-16 LAB — T4: T4, Total: 10.7 ug/dL (ref 4.5–12.0)

## 2020-12-17 ENCOUNTER — Inpatient Hospital Stay: Payer: BC Managed Care – PPO

## 2020-12-17 ENCOUNTER — Other Ambulatory Visit: Payer: Self-pay

## 2020-12-17 VITALS — BP 140/77 | HR 72 | Temp 97.9°F | Resp 18

## 2020-12-17 DIAGNOSIS — C160 Malignant neoplasm of cardia: Secondary | ICD-10-CM

## 2020-12-17 MED ORDER — SODIUM CHLORIDE 0.9% FLUSH
10.0000 mL | INTRAVENOUS | Status: DC | PRN
  Administered 2020-12-17: 10 mL
  Filled 2020-12-17: qty 10

## 2020-12-17 MED ORDER — HEPARIN SOD (PORK) LOCK FLUSH 100 UNIT/ML IV SOLN
500.0000 [IU] | Freq: Once | INTRAVENOUS | Status: AC | PRN
  Administered 2020-12-17: 500 [IU]
  Filled 2020-12-17: qty 5

## 2020-12-19 ENCOUNTER — Emergency Department (INDEPENDENT_AMBULATORY_CARE_PROVIDER_SITE_OTHER)
Admission: EM | Admit: 2020-12-19 | Discharge: 2020-12-19 | Disposition: A | Discharge status: Home / Self Care | Payer: BC Managed Care – PPO | Source: Home / Self Care

## 2020-12-19 ENCOUNTER — Other Ambulatory Visit: Payer: Self-pay

## 2020-12-19 ENCOUNTER — Emergency Department (INDEPENDENT_AMBULATORY_CARE_PROVIDER_SITE_OTHER): Payer: BC Managed Care – PPO

## 2020-12-19 DIAGNOSIS — S60221A Contusion of right hand, initial encounter: Secondary | ICD-10-CM

## 2020-12-19 DIAGNOSIS — M79641 Pain in right hand: Secondary | ICD-10-CM | POA: Diagnosis not present

## 2020-12-19 MED ORDER — ACETAMINOPHEN 325 MG PO TABS
650.0000 mg | ORAL_TABLET | Freq: Once | ORAL | Status: AC
  Administered 2020-12-19: 650 mg via ORAL

## 2020-12-19 NOTE — ED Triage Notes (Signed)
Pt c/o RT hand pain after having a piece of solid wood shoot out of the table saw into his hand between his thumb and index finger. Bruising noted. Pain 6/10 Ice applied.

## 2020-12-19 NOTE — Discharge Instructions (Addendum)
No fracture or break on x-ray. Rest, ice, and OTC medicine as directed as needed. Can wear ACE wrap for comfort. Follow-up with PCP or ortho if no improvement in a week or two.

## 2020-12-19 NOTE — ED Provider Notes (Signed)
[facility]    CSN: [id number] Arrival date & time: 12/19/20  1247      History   Chief Complaint Chief Complaint  Patient presents with   Hand Injury    RT    HPI Eddie Hernandez is a 65 y.o. male.   Patient presents with right hand injury. He was at home earlier today and while cutting a piece of wood on a table saw, a piece got hung up and shot out "like a missle" and hit his right hand between the thumb and index finger. He reports pain, swelling, and bruising since then, worse with using the right hand. He is left-handed. He applied ice but has not taken anything for it. He denies numbness or tingling.   The history is provided by the patient.  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   GERD (gastroesophageal reflux disease)    Hyperlipidemia    Hypertension    Hypothyroidism    Iron deficiency anemia due to chronic blood loss 10/15/2020   PAD (peripheral artery disease) ([facility])    Peripheral neuropathy    small fiber   Prostate pain    Stroke [facility])    Per CT scan - Old - pt was not aware    Tremor     Patient Active Problem List   Diagnosis Date Noted   Iron deficiency anemia due to chronic blood loss 10/15/2020   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   Goals of care, counseling/discussion 10/14/2020   Atherosclerosis of native arteries of extremity with intermittent claudication ([place]) 10/09/2020   Elevated PSA 09/17/2020   Pulmonary nodule 09/15/2020   Chronic constipation 03/28/2020   Peripheral polyneuropathy 01/23/2020   Renal cyst, left 11/13/2019   Degenerative disc disease, cervical 11/13/2019   History of lacunar cerebrovascular accident 11/13/2019   Low testosterone 09/27/2019   Fracture left fifth toe proximal phalanx 09/13/2019   Tibialis posterior  tendinitis, left 09/13/2019   Chronic pain of left thumb 07/26/2019   PAD (peripheral artery disease) (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   Dry mouth 09/23/2015   Numbness and tingling 09/23/2015   Polyuria 09/23/2015   Attempted suicide ([place]) 06/03/2014   Hyperglycemia 02/15/2013   Vitreous detachment of both eyes 08/10/2012   TOBACCO ABUSE 06/16/2010   TESTICULAR HYPOFUNCTION 10/08/2009   IBS 07/22/2009   COPD, group B, by GOLD 2017 classification ([place]) 04/21/2009   HIP PAIN, RIGHT 04/14/2009   LIBIDO, DECREASED 04/14/2009   IFG (impaired fasting glucose) 06/19/2008   Hypothyroidism 04/13/2007   Hyperlipidemia 04/11/2006   Moderate depressed bipolar I disorder ([place]) 04/11/2006   ABUSE, ALCOHOL, UNSPECIFIED 04/11/2006   GERD 04/11/2006   Abnormal involuntary movement 04/11/2006    Past Surgical History:  Procedure Laterality Date   brain stimulator removed     COLONOSCOPY     DEEP BRAIN STIMULATOR PLACEMENT  01-30-08   tremors   ILIAC ARTERY STENT     x2   IR IMAGING GUIDED PORT INSERTION  10/21/2020       Home Medications    Prior to Admission medications   Medication Sig Start Date End Date Taking? Authorizing Provider  acetaminophen (TYLENOL) 500 MG tablet Take 1,000 mg by mouth every 6 (six) hours as needed.    [provider]  ARIPiprazole (ABILIFY) 5 MG tablet TAKE ONE TABLET (5 MG TOTAL) BY MOUTH DAILY 07/21/16   Hali Marry, MD  cilostazol (PLETAL) 100 MG tablet Take 100 mg by mouth 2 (two) times daily. 09/03/20   [provider]  dexamethasone (DECADRON) 4 MG tablet Take 2 tablets (8 mg total) by mouth daily. Start the  day after chemotherapy for 2 days. Take with food. 10/16/20   [name], MD  dicyclomine (BENTYL) 10 MG capsule TAKE 1 CAPSULE (10 MG TOTAL) BY MOUTH 3 (THREE) TIMES DAILY BEFORE MEALS. 12/15/20   Hali Marry, MD  diphenoxylate-atropine (LOMOTIL) 2.5-0.025 MG tablet Take 2 tablets by mouth 4 (four) times daily as needed for diarrhea or loose stools. 12/04/20   [name], MD  DULoxetine (CYMBALTA) 60 MG capsule Take 60 mg by mouth daily. 11/18/20   [provider]  eszopiclone (LUNESTA) 2 MG TABS tablet Take 2 mg by mouth at bedtime as needed. 11/18/20   [provider]  levothyroxine (SYNTHROID) 150 MCG tablet Take 1 tablet (150 mcg total) by mouth daily before breakfast. 12/04/20   [name], MD  lidocaine-prilocaine (EMLA) cream Apply to affected area once Patient not taking: Reported on 12/15/2020 10/16/20   [name], MD  lisinopril (ZESTRIL) 20 MG tablet Take 20 mg by mouth daily. 12/21/19   Powers, Elyse Jarvis, MD  lubiprostone (AMITIZA) 8 MCG capsule TAKE 1 CAPSULE (8 MCG TOTAL) BY MOUTH 2 (TWO) TIMES DAILY WITH A MEAL. Patient not taking: Reported on 12/15/2020 11/10/20   Hali Marry, MD  mirtazapine (REMERON SOL-TAB) 15 MG disintegrating tablet Take 15 mg by mouth daily. 01/30/15   [provider]  NUVIGIL 250 MG tablet Take 250 mg by mouth daily. 03/18/15   [provider]  ondansetron (ZOFRAN) 8 MG tablet Take 1 tablet (8 mg total) by mouth 2 (two) times daily as needed for refractory nausea / vomiting. Start on day 3 after chemotherapy. Patient not taking: Reported on 12/15/2020 10/16/20   [name], MD  pantoprazole (PROTONIX) 40 MG tablet Take 1 tablet by mouth daily. 08/29/20   [provider]  pentoxifylline (TRENTAL) 400 MG CR tablet Take by mouth. 08/07/19   [provider]  potassium chloride SA (KLOR-CON) 20 MEQ tablet Take 2 tablets (40 mEq total) by mouth 2 (two) times daily. 12/04/20   [name], MD  prednisoLONE acetate (PRED FORTE) 1 % ophthalmic suspension 1 drop 2 (two) times daily. 12/09/20   [provider]  pregabalin (LYRICA) 200 MG capsule Take 1 capsule by mouth 3 (three) times daily. 07/24/19   [provider]  prochlorperazine (COMPAZINE) 10 MG tablet Take 1 tablet (10 mg total) by mouth every 6 (six) hours as needed (Nausea or vomiting). Patient not taking: Reported on 12/15/2020 10/16/20   [name], MD  SODIUM FLUORIDE 5000 PPM 1.1 % PSTE SMARTSIG:Sparingly Topical Every Night 08/28/20   [provider]    Family History Family History  Problem Relation Age of Onset   Stroke Father    Alcoholism Father    Cancer Sister        Lung    Colon  cancer Neg Hx    Colon polyps Neg Hx    Esophageal cancer Neg Hx    Rectal cancer Neg Hx    Stomach cancer Neg Hx     Social History Social History   Tobacco Use   Smoking status: Former    Packs/day: 1.00    Years: 45.00    Pack years: 45.00    Types: Cigarettes    Quit date: 09/08/2017    Years since quitting: 3.2   Smokeless tobacco: Never  Vaping Use   Vaping Use: Former   Substances: Nicotine  Substance Use Topics   Alcohol use: No    Alcohol/week: 0.0 standard drinks    Comment: hx of EtOH abuse   Drug use: No     Allergies   Patient has no known allergies.   Review of Systems Review of Systems  Constitutional:  Negative for fatigue and fever.  Respiratory:  Negative for shortness of breath.   Musculoskeletal:  Positive for arthralgias and joint swelling.  Skin:  Positive for color change.  Neurological:  Negative for weakness and numbness.    Physical Exam Triage Vital Signs ED Triage Vitals  Enc Vitals Group     BP 12/19/20 1257 (!) 143/70     Pulse Rate 12/19/20 1257 70     Resp 12/19/20 1257 18     Temp 12/19/20 1257 98.3 F (36.8 C)     Temp Source 12/19/20 1257 Oral     SpO2 12/19/20 1257 97 %     Weight --      Height --      Head  Circumference --      Peak Flow --      Pain Score 12/19/20 1258 6     Pain Loc --      Pain Edu? --      Excl. in [place]? --    No data found.  Updated Vital Signs BP (!) 143/70 (BP Location: Right Arm)   Pulse 70   Temp 98.3 F (36.8 C) (Oral)   Resp 18   SpO2 97%   Visual Acuity Right Eye Distance:   Left Eye Distance:   Bilateral Distance:    Right Eye Near:   Left Eye Near:    Bilateral Near:     Physical Exam Vitals and nursing note reviewed.  Constitutional:      General: He is not in acute distress. HENT:     Head: Normocephalic.  Eyes:     Pupils: Pupils are equal, round, and reactive to light.  Cardiovascular:     Pulses: Normal pulses.  Pulmonary:     Effort: Pulmonary effort is normal.  Musculoskeletal:     Right hand: Swelling and tenderness present. Normal range of motion. Normal sensation. Normal capillary refill. Normal pulse.     Comments: Swelling and early ecchymosis to right hand over radial dorsal and palmar aspects and web space between thumb and 2nd digit. Tenderness along 1st metacarpal, MCP, prox phalanx but less over IP and distal phalanx. Mild tenderness along 2nd metacarpal not extending into 2nd digit or to remaining hand or wrist. A few small superficial abrasions to the area. Full ROM including opposition, reports discomfort particularly with making a tight fist and with thumb movements.   Neurological:     Mental Status: He is alert.  Psychiatric:        Mood and Affect: Mood normal.     UC Treatments / Results  Labs (all labs  ordered are listed, but only abnormal results are displayed) Labs Reviewed - No data to display  EKG   Radiology DG Hand Complete Right  Result Date: 12/19/2020 CLINICAL DATA:  Pain and bruising about the dorsal aspect of the right hand at the thumb after a blow to the hand with a piece of wood today. Initial encounter. EXAM: RIGHT HAND - COMPLETE 3+ VIEW COMPARISON:  None. FINDINGS: No acute bony or joint  abnormality is identified. Accessory ossicle off the distal ulna versus remote ulnar styloid fracture noted. Soft tissues are negative. IMPRESSION: No acute abnormality. Electronically Signed   By: Inge Rise M.D.   On: 12/19/2020 13:41    Procedures Procedures (including critical care time)  Medications Ordered in UC Medications  acetaminophen (TYLENOL) tablet 650 mg (650 mg Oral Given 12/19/20 1302)    Initial Impression / Assessment and Plan / UC Course  I have reviewed the triage vital signs and the nursing notes.  Pertinent labs & imaging results that were available during my care of the patient were reviewed by me and considered in my medical decision making (see chart for details).     No fx. Contusion. RICE, reassurance and f/u precautions discussed.   E/M: 1 acute uncomplicated injury, 1 data (xray), low risk  Final Clinical Impressions(s) / UC Diagnoses   Final diagnoses:  Contusion of right hand, initial encounter     Discharge Instructions      No fracture or break on x-ray. Rest, ice, and OTC medicine as directed as needed. Can wear ACE wrap for comfort. Follow-up with PCP or ortho if no improvement in a week or two.      ED Prescriptions   None    PDMP not reviewed this encounter.   Delsa Sale, [place] 12/19/20 1358

## 2020-12-22 ENCOUNTER — Telehealth (HOSPITAL_COMMUNITY): Payer: Self-pay | Admitting: Dietician

## 2020-12-22 ENCOUNTER — Encounter (HOSPITAL_COMMUNITY): Payer: BC Managed Care – PPO | Admitting: Dietician

## 2020-12-22 NOTE — Telephone Encounter (Signed)
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 Patient is followed by Dr. Marin Olp.  Past medical history includes HLD, HTN, constipation, alcohol abuse, bipolar 1, iron deficiency anemia due to blood loss  Spoke with patient via telephone. He reports overall doing well. Appetite has been fairly good, he has been eating small frequent meals. He recalls a lot of fruits (pears, cherries, blue berries), pizza, pickles, Premier protein. He has mild cold sensitivity for a few days after treatment. Patient reports mild nausea, denies vomiting. Patient experienced diarrhea s/p 2nd cycle reports 6-8 episodes/day. Patient required IV potassium for severe hypokalemia (2.3) on 7/7 followed by daily oral potassium. Patient reports taking Lomotil for diarrhea, this is working well. Patient reports daily bowel movements, states he had "massive BM" this morning. Patient would like to know what he should be eating, would like to have set daily meal planning.    Nutrition Focused Physical Exam: unable to complete   Medications: Decadron, Abilify, Lomotil, Protonix, Prednisolone, Zofran   Labs: 7/18- glucose 143, LDH 275   Anthropometrics: Patient reports 190 lb on home scale this morning  Height: 6' Weight: 203 lb (7/18) UBW: 185 lb per pt BMI: 27.53    NUTRITION DIAGNOSIS: Inadequate oral intake related to cancer and associated treatment side effects as evidenced by reported nausea, cold sensitivity, and diarrhea resulting in electrolyte abnormalities   INTERVENTION:  Educated on importance of adequate calorie and protein energy intake to maintain weights, strength, and nutrition Discussed varying appetite/intake/food preferences d/t nutrition impact symptoms, encouraged keeping variety of foods at home vs set daily meal plan Discussed foods with protein to  include with meals and snacks, will mail high calorie high protein snack ideas Offered warm supplement ideas, will mail handout with recipes Continue drinking daily nutrition supplement, recommended switching to Ensure Plus/Complete for added calories, will mail coupons Discussed strategies for nausea, will mail handout Discussed strategies for diarrhea, will mail handout Continue taking Lomotil  Contact information provided    MONITORING, EVALUATION, GOAL: Patient will tolerate increased calories and protein to minimize weight loss   Next Visit: Monday August 22 via telephone

## 2020-12-23 ENCOUNTER — Encounter: Payer: Self-pay | Admitting: *Deleted

## 2020-12-23 NOTE — Progress Notes (Signed)
Oncology Nurse Navigator Documentation  Oncology Nurse Navigator Flowsheets 12/23/2020  Abnormal Finding Date -  Confirmed Diagnosis Date -  Diagnosis Status -  Phase of Treatment -  Chemotherapy Actual Start Date: -  Chemotherapy Expected End Date: -  Navigator Follow Up Date: 01/05/2021  Navigator Follow Up Reason: Follow-up Appointment;Chemotherapy  Navigator Restaurant manager, fast food Encounter Type Appt/Treatment Plan Review  Telephone -  Treatment Initiated Date -  Patient Visit Type MedOnc  Treatment Phase Active Tx  Barriers/Navigation Needs Coordination of Care;Education  Education -  Interventions None Required  Acuity Level 2-Minimal Needs (1-2 Barriers Identified)  Coordination of Care -  Education Method -  Support Groups/Services Friends and Family  Time Spent with Patient 15

## 2020-12-24 ENCOUNTER — Inpatient Hospital Stay: Payer: BC Managed Care – PPO

## 2020-12-24 ENCOUNTER — Telehealth: Payer: Self-pay | Admitting: *Deleted

## 2020-12-24 ENCOUNTER — Other Ambulatory Visit: Payer: Self-pay

## 2020-12-24 ENCOUNTER — Other Ambulatory Visit: Payer: Self-pay | Admitting: Hematology & Oncology

## 2020-12-24 VITALS — BP 123/68 | HR 83 | Temp 98.2°F | Resp 16

## 2020-12-24 DIAGNOSIS — C169 Malignant neoplasm of stomach, unspecified: Secondary | ICD-10-CM

## 2020-12-24 DIAGNOSIS — D649 Anemia, unspecified: Secondary | ICD-10-CM

## 2020-12-24 DIAGNOSIS — C160 Malignant neoplasm of cardia: Secondary | ICD-10-CM

## 2020-12-24 LAB — CMP (CANCER CENTER ONLY)
ALT: 18 U/L (ref 0–44)
AST: 29 U/L (ref 15–41)
Albumin: 3.8 g/dL (ref 3.5–5.0)
Alkaline Phosphatase: 102 U/L (ref 38–126)
Anion gap: 6 (ref 5–15)
BUN: 17 mg/dL (ref 8–23)
CO2: 25 mmol/L (ref 22–32)
Calcium: 8.8 mg/dL — ABNORMAL LOW (ref 8.9–10.3)
Chloride: 105 mmol/L (ref 98–111)
Creatinine: 0.84 mg/dL (ref 0.61–1.24)
GFR, Estimated: >60 mL/min (ref >60)
Glucose, Bld: 146 mg/dL — ABNORMAL HIGH (ref 70–99)
Potassium: 3.2 mmol/L — ABNORMAL LOW (ref 3.5–5.1)
Sodium: 136 mmol/L (ref 135–145)
Total Bilirubin: 0.4 mg/dL (ref 0.3–1.2)
Total Protein: 6.2 g/dL — ABNORMAL LOW (ref 6.5–8.1)

## 2020-12-24 LAB — CBC WITH DIFFERENTIAL (CANCER CENTER ONLY)
Abs Immature Granulocytes: 0.02 10*3/uL (ref 0.00–0.07)
Basophils Absolute: 0 10*3/uL (ref 0.0–0.1)
Basophils Relative: 1 %
Eosinophils Absolute: 0.2 10*3/uL (ref 0.0–0.5)
Eosinophils Relative: 4 %
HCT: 31 % — ABNORMAL LOW (ref 39.0–52.0)
Hemoglobin: 10.2 g/dL — ABNORMAL LOW (ref 13.0–17.0)
Immature Granulocytes: 0 %
Lymphocytes Relative: 20 %
Lymphs Abs: 1.1 10*3/uL (ref 0.7–4.0)
MCH: 29.8 pg (ref 26.0–34.0)
MCHC: 32.9 g/dL (ref 30.0–36.0)
MCV: 90.6 fL (ref 80.0–100.0)
Monocytes Absolute: 0.3 10*3/uL (ref 0.1–1.0)
Monocytes Relative: 6 %
Neutro Abs: 3.9 10*3/uL (ref 1.7–7.7)
Neutrophils Relative %: 69 %
Platelet Count: 118 10*3/uL — ABNORMAL LOW (ref 150–400)
RBC: 3.42 MIL/uL — ABNORMAL LOW (ref 4.22–5.81)
RDW: 18 % — ABNORMAL HIGH (ref 11.5–15.5)
WBC Count: 5.6 10*3/uL (ref 4.0–10.5)
nRBC: 0 % (ref 0.0–0.2)

## 2020-12-24 LAB — MAGNESIUM: Magnesium: 1.6 mg/dL — ABNORMAL LOW (ref 1.7–2.4)

## 2020-12-24 LAB — SAMPLE TO BLOOD BANK

## 2020-12-24 MED ORDER — SODIUM CHLORIDE 0.9 % IV SOLN
Freq: Once | INTRAVENOUS | Status: AC
Start: 2020-12-24 — End: 2020-12-24
  Filled 2020-12-24: qty 250

## 2020-12-24 NOTE — Telephone Encounter (Signed)
Message received from patient stating that he is very weak, fatigued, lightheaded, has no appetite, had a large formed stool this morning with a foul odor and greenish in color and a stool approx one hour ago that was like clear jelly, pt denies any n/v or pain. Dr. Marin Olp notified and would like for pt to come in now for labs and IVF's.  Pt notified and states that he can be here by 3:00PM.  Dr. Marin Olp notified.

## 2020-12-24 NOTE — Patient Instructions (Signed)

## 2020-12-25 ENCOUNTER — Other Ambulatory Visit: Payer: Self-pay | Admitting: Pharmacist

## 2020-12-25 ENCOUNTER — Encounter: Payer: Self-pay | Admitting: Hematology & Oncology

## 2020-12-25 LAB — TSH: TSH: 3.257 u[IU]/mL (ref 0.320–4.118)

## 2020-12-25 LAB — T4: T4, Total: 10.8 ug/dL (ref 4.5–12.0)

## 2021-01-05 ENCOUNTER — Inpatient Hospital Stay: Payer: BC Managed Care – PPO | Attending: Hematology & Oncology

## 2021-01-05 ENCOUNTER — Inpatient Hospital Stay: Payer: BC Managed Care – PPO

## 2021-01-05 ENCOUNTER — Inpatient Hospital Stay (HOSPITAL_BASED_OUTPATIENT_CLINIC_OR_DEPARTMENT_OTHER): Payer: BC Managed Care – PPO | Admitting: Hematology & Oncology

## 2021-01-05 ENCOUNTER — Telehealth: Payer: Self-pay | Admitting: *Deleted

## 2021-01-05 VITALS — BP 121/64 | HR 79 | Temp 97.8°F | Resp 18 | Wt 202.1 lb

## 2021-01-05 DIAGNOSIS — Z5189 Encounter for other specified aftercare: Secondary | ICD-10-CM | POA: Diagnosis not present

## 2021-01-05 DIAGNOSIS — Z5112 Encounter for antineoplastic immunotherapy: Secondary | ICD-10-CM | POA: Insufficient documentation

## 2021-01-05 DIAGNOSIS — C160 Malignant neoplasm of cardia: Secondary | ICD-10-CM | POA: Diagnosis present

## 2021-01-05 DIAGNOSIS — C169 Malignant neoplasm of stomach, unspecified: Secondary | ICD-10-CM

## 2021-01-05 DIAGNOSIS — T451X5A Adverse effect of antineoplastic and immunosuppressive drugs, initial encounter: Secondary | ICD-10-CM | POA: Diagnosis not present

## 2021-01-05 DIAGNOSIS — D701 Agranulocytosis secondary to cancer chemotherapy: Secondary | ICD-10-CM | POA: Insufficient documentation

## 2021-01-05 DIAGNOSIS — R110 Nausea: Secondary | ICD-10-CM | POA: Diagnosis not present

## 2021-01-05 DIAGNOSIS — K580 Irritable bowel syndrome with diarrhea: Secondary | ICD-10-CM | POA: Insufficient documentation

## 2021-01-05 DIAGNOSIS — Z5111 Encounter for antineoplastic chemotherapy: Secondary | ICD-10-CM | POA: Diagnosis present

## 2021-01-05 LAB — CMP (CANCER CENTER ONLY)
ALT: 19 U/L (ref 0–44)
AST: 26 U/L (ref 15–41)
Albumin: 3.6 g/dL (ref 3.5–5.0)
Alkaline Phosphatase: 111 U/L (ref 38–126)
Anion gap: 7 (ref 5–15)
BUN: 10 mg/dL (ref 8–23)
CO2: 27 mmol/L (ref 22–32)
Calcium: 8.9 mg/dL (ref 8.9–10.3)
Chloride: 104 mmol/L (ref 98–111)
Creatinine: 0.91 mg/dL (ref 0.61–1.24)
GFR, Estimated: >60 mL/min (ref >60)
Glucose, Bld: 106 mg/dL — ABNORMAL HIGH (ref 70–99)
Potassium: 3.7 mmol/L (ref 3.5–5.1)
Sodium: 138 mmol/L (ref 135–145)
Total Bilirubin: 0.4 mg/dL (ref 0.3–1.2)
Total Protein: 5.9 g/dL — ABNORMAL LOW (ref 6.5–8.1)

## 2021-01-05 LAB — CBC WITH DIFFERENTIAL (CANCER CENTER ONLY)
Abs Immature Granulocytes: 0 10*3/uL (ref 0.00–0.07)
Basophils Absolute: 0 10*3/uL (ref 0.0–0.1)
Basophils Relative: 2 %
Eosinophils Absolute: 0.1 10*3/uL (ref 0.0–0.5)
Eosinophils Relative: 6 %
HCT: 31.6 % — ABNORMAL LOW (ref 39.0–52.0)
Hemoglobin: 10.5 g/dL — ABNORMAL LOW (ref 13.0–17.0)
Immature Granulocytes: 0 %
Lymphocytes Relative: 38 %
Lymphs Abs: 0.8 10*3/uL (ref 0.7–4.0)
MCH: 30.5 pg (ref 26.0–34.0)
MCHC: 33.2 g/dL (ref 30.0–36.0)
MCV: 91.9 fL (ref 80.0–100.0)
Monocytes Absolute: 0.8 10*3/uL (ref 0.1–1.0)
Monocytes Relative: 39 %
Neutro Abs: 0.3 10*3/uL — CL (ref 1.7–7.7)
Neutrophils Relative %: 15 %
Platelet Count: 233 10*3/uL (ref 150–400)
RBC: 3.44 MIL/uL — ABNORMAL LOW (ref 4.22–5.81)
RDW: 18.7 % — ABNORMAL HIGH (ref 11.5–15.5)
WBC Count: 2 10*3/uL — ABNORMAL LOW (ref 4.0–10.5)
nRBC: 0 % (ref 0.0–0.2)

## 2021-01-05 LAB — IRON AND TIBC
Iron: 56 ug/dL (ref 42–163)
Saturation Ratios: 15 % — ABNORMAL LOW (ref 20–55)
TIBC: 384 ug/dL (ref 202–409)
UIBC: 327 ug/dL (ref 117–376)

## 2021-01-05 LAB — MAGNESIUM: Magnesium: 1.6 mg/dL — ABNORMAL LOW (ref 1.7–2.4)

## 2021-01-05 LAB — FERRITIN: Ferritin: 89 ng/mL (ref 24–336)

## 2021-01-05 LAB — LACTATE DEHYDROGENASE: LDH: 198 U/L — ABNORMAL HIGH (ref 98–192)

## 2021-01-05 LAB — TSH: TSH: 0.899 u[IU]/mL (ref 0.320–4.118)

## 2021-01-05 MED ORDER — SODIUM CHLORIDE 0.9 % IV SOLN
1920.0000 mg/m2 | INTRAVENOUS | Status: DC
  Administered 2021-01-05: 4150 mg via INTRAVENOUS
  Filled 2021-01-05: qty 83

## 2021-01-05 MED ORDER — SODIUM CHLORIDE 0.9% FLUSH
10.0000 mL | INTRAVENOUS | Status: DC | PRN
  Filled 2021-01-05: qty 10

## 2021-01-05 MED ORDER — SODIUM CHLORIDE 0.9 % IV SOLN
Freq: Once | INTRAVENOUS | Status: AC
  Filled 2021-01-05: qty 250

## 2021-01-05 MED ORDER — HEPARIN SOD (PORK) LOCK FLUSH 100 UNIT/ML IV SOLN
500.0000 [IU] | Freq: Once | INTRAVENOUS | Status: DC | PRN
  Filled 2021-01-05: qty 5

## 2021-01-05 MED ORDER — SODIUM CHLORIDE 0.9 % IV SOLN
10.0000 mg | Freq: Once | INTRAVENOUS | Status: AC
  Administered 2021-01-05: 10 mg via INTRAVENOUS
  Filled 2021-01-05: qty 10

## 2021-01-05 MED ORDER — FLUOROURACIL CHEMO INJECTION 2.5 GM/50ML
320.0000 mg/m2 | Freq: Once | INTRAVENOUS | Status: AC
  Administered 2021-01-05: 700 mg via INTRAVENOUS
  Filled 2021-01-05: qty 14

## 2021-01-05 MED ORDER — PALONOSETRON HCL INJECTION 0.25 MG/5ML
0.2500 mg | Freq: Once | INTRAVENOUS | Status: AC
  Administered 2021-01-05: 0.25 mg via INTRAVENOUS

## 2021-01-05 MED ORDER — TRASTUZUMAB-DKST CHEMO 150 MG IV SOLR
4.0000 mg/kg | Freq: Once | INTRAVENOUS | Status: AC
  Administered 2021-01-05: 357 mg via INTRAVENOUS
  Filled 2021-01-05: qty 17

## 2021-01-05 MED ORDER — DEXTROSE 5 % IV SOLN
Freq: Once | INTRAVENOUS | Status: AC
  Filled 2021-01-05: qty 250

## 2021-01-05 MED ORDER — OXALIPLATIN CHEMO INJECTION 100 MG/20ML
69.0000 mg/m2 | Freq: Once | INTRAVENOUS | Status: AC
  Administered 2021-01-05: 150 mg via INTRAVENOUS
  Filled 2021-01-05: qty 20

## 2021-01-05 MED ORDER — SODIUM CHLORIDE 0.9 % IV SOLN
480.0000 mg | Freq: Once | INTRAVENOUS | Status: AC
  Administered 2021-01-05: 480 mg via INTRAVENOUS
  Filled 2021-01-05: qty 48

## 2021-01-05 MED ORDER — PALONOSETRON HCL INJECTION 0.25 MG/5ML
INTRAVENOUS | Status: AC
  Filled 2021-01-05: qty 5

## 2021-01-05 MED ORDER — LEUCOVORIN CALCIUM INJECTION 350 MG
400.0000 mg/m2 | Freq: Once | INTRAVENOUS | Status: AC
  Administered 2021-01-05: 864 mg via INTRAVENOUS
  Filled 2021-01-05: qty 43.2

## 2021-01-05 MED ORDER — ACETAMINOPHEN 325 MG PO TABS
650.0000 mg | ORAL_TABLET | Freq: Once | ORAL | Status: AC
  Administered 2021-01-05: 650 mg via ORAL

## 2021-01-05 MED ORDER — DIPHENHYDRAMINE HCL 25 MG PO CAPS
50.0000 mg | ORAL_CAPSULE | Freq: Once | ORAL | Status: AC
  Administered 2021-01-05: 50 mg via ORAL

## 2021-01-05 MED ORDER — ACETAMINOPHEN 325 MG PO TABS
ORAL_TABLET | ORAL | Status: AC
  Filled 2021-01-05: qty 2

## 2021-01-05 MED ORDER — DIPHENHYDRAMINE HCL 25 MG PO CAPS
ORAL_CAPSULE | ORAL | Status: AC
  Filled 2021-01-05: qty 1

## 2021-01-05 NOTE — Telephone Encounter (Addendum)
Dr. Marin Olp notified of ANC-0.3.  OK to treat today with reduced dose and pt to receive Neulasta on day of pump d/c per order of Dr. Marin Olp.

## 2021-01-05 NOTE — Progress Notes (Signed)
Ok to treat with ANC of 0.3 per Dr Marin Olp. Dose Reduced. Pt to receive Neupogen

## 2021-01-05 NOTE — Patient Instructions (Signed)
[facility]  Discharge Instructions: Thank you for choosing [facility] to provide your oncology and hematology care.   If you have a lab appointment with the [facility], please go directly to the [facility] and check in at the registration area.  Wear comfortable clothing and clothing appropriate for easy access to any Portacath or PICC line.   We strive to give you quality time with your provider. You may need to reschedule your appointment if you arrive late (15 or more minutes).  Arriving late affects you and other patients whose appointments are after yours.  Also, if you miss three or more appointments without notifying the office, you may be dismissed from the clinic at the provider's discretion.      For prescription refill requests, have your pharmacy contact our office and allow 72 hours for refills to be completed.    Today you received the following chemotherapy and/or immunotherapy agents 5-fu, leucovorin, oxaliplatin, herceptin   To help prevent nausea and vomiting after your treatment, we encourage you to take your nausea medication as directed.  BELOW ARE SYMPTOMS THAT SHOULD BE REPORTED IMMEDIATELY: *FEVER GREATER THAN 100.4 F (38 C) OR HIGHER *CHILLS OR SWEATING *NAUSEA AND VOMITING THAT IS NOT CONTROLLED WITH YOUR NAUSEA MEDICATION *UNUSUAL SHORTNESS OF BREATH *UNUSUAL BRUISING OR BLEEDING *URINARY PROBLEMS (pain or burning when urinating, or frequent urination) *BOWEL PROBLEMS (unusual diarrhea, constipation, pain near the anus) TENDERNESS IN MOUTH AND THROAT WITH OR WITHOUT PRESENCE OF ULCERS (sore throat, sores in mouth, or a toothache) UNUSUAL RASH, SWELLING OR PAIN  UNUSUAL VAGINAL DISCHARGE OR ITCHING   Items with * indicate a potential emergency and should be followed up as soon as possible or go to the Emergency Department if any problems should occur.  Please show the CHEMOTHERAPY ALERT CARD or IMMUNOTHERAPY  ALERT CARD at check-in to the Emergency Department and triage nurse. Should you have questions after your visit or need to cancel or reschedule your appointment, please contact [facility]  [phone] and follow the prompts.  Office hours are 8:00 a.m. to 4:30 p.m. Monday - Friday. Please note that voicemails left after 4:00 p.m. may not be returned until the following business day.  We are closed weekends and major holidays. You have access to a nurse at all times for urgent questions. Please call the main number to the clinic [phone] and follow the prompts.  For any non-urgent questions, you may also contact your provider using MyChart. We now offer e-Visits for anyone 39 and older to request care online for non-urgent symptoms. For details visit [email or web address].   Also download the MyChart app! Go to the app store, search "MyChart", open the app, select [facility], and log in with your MyChart username and password.  Due to Covid, a mask is required upon entering the hospital/clinic. If you do not have a mask, one will be given to you upon arrival. For doctor visits, patients may have 1 support person aged 70 or older with them. For treatment visits, patients cannot have anyone with them due to current Covid guidelines and our immunocompromised population.

## 2021-01-05 NOTE — Progress Notes (Signed)
Hematology and Oncology Follow Up Visit  MAZE CORNIEL [id number] 11/09/1955 65 y.o. 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     Interim History:  Mr. Mitro is back for treatment.  Although his white cell count is low, the monocytes are quite high.  As such, I think we go ahead with treatment.  I am going to have to clearly decrease his treatment dose.  I think his  Have to be on Neulasta so that we can try to keep him on schedule.  He feels okay.  He is losing more hair than I would have thought he would lose.  He has had no diarrhea.  Lomotil seems to help him.  He has had no issues with bleeding.  He has had no cough.  He has had no rashes.  There is been no mouth sores.    Had to believe that he is responding to treatment.  We will set him up with a PET scan after this cycle to see how things are going.    Currently, his performance status is ECOG 0.   Medications:  Current Outpatient Medications:    acetaminophen (TYLENOL) 500 MG tablet, Take 1,000 mg by mouth every 6 (six) hours as needed., Disp: , Rfl:    ARIPiprazole (ABILIFY) 5 MG tablet, TAKE ONE TABLET (5 MG TOTAL) BY MOUTH DAILY, Disp: 30 tablet, Rfl: 3   cilostazol (PLETAL) 100 MG tablet, Take 100 mg by mouth 2 (two) times daily., Disp: , Rfl:    dexamethasone (DECADRON) 4 MG tablet, Take 2 tablets (8 mg total) by mouth daily. Start the day after chemotherapy for 2 days. Take with food., Disp: 30 tablet, Rfl: 1   dicyclomine (BENTYL) 10 MG capsule, TAKE 1 CAPSULE (10 MG TOTAL) BY MOUTH 3 (THREE) TIMES DAILY BEFORE MEALS., Disp: 90 capsule, Rfl: 0   diphenoxylate-atropine (LOMOTIL) 2.5-0.025 MG tablet, Take 2 tablets by mouth 4 (four) times daily as needed for diarrhea or loose stools., Disp: 100 tablet, Rfl: 0    DULoxetine (CYMBALTA) 60 MG capsule, Take 60 mg by mouth daily., Disp: , Rfl:    eszopiclone (LUNESTA) 2 MG TABS tablet, Take 2 mg by mouth at bedtime as needed., Disp: , Rfl:    levothyroxine (SYNTHROID) 150 MCG tablet, Take 1 tablet (150 mcg total) by mouth daily before breakfast., Disp: 30 tablet, Rfl: 4   lidocaine-prilocaine (EMLA) cream, Apply to affected area once, Disp: 30 g, Rfl: 3   lisinopril (ZESTRIL) 20 MG tablet, Take 20 mg by mouth daily., Disp: , Rfl:    lubiprostone (AMITIZA) 8 MCG capsule, TAKE 1 CAPSULE (8 MCG TOTAL) BY MOUTH 2 (TWO) TIMES DAILY WITH A MEAL., Disp: 180 capsule, Rfl: 2   mirtazapine (REMERON SOL-TAB) 15 MG disintegrating tablet, Take 15 mg by mouth daily., Disp: , Rfl:    NUVIGIL 250 MG tablet, Take 250 mg by mouth daily., Disp: , Rfl:    ondansetron (ZOFRAN) 8 MG tablet, Take 1 tablet (8 mg total) by mouth 2 (two) times daily as needed for refractory nausea / vomiting. Start on day 3 after chemotherapy., Disp: 30 tablet, Rfl: 1   pantoprazole (PROTONIX) 40 MG tablet, Take 1 tablet by mouth daily., Disp: , Rfl:    pentoxifylline (TRENTAL) 400 MG CR tablet, Take by mouth., Disp: , Rfl:  potassium chloride SA (KLOR-CON) 20 MEQ tablet, Take 2 tablets (40 mEq total) by mouth 2 (two) times daily., Disp: 120 tablet, Rfl: 3   prednisoLONE acetate (PRED FORTE) 1 % ophthalmic suspension, 1 drop 2 (two) times daily., Disp: , Rfl:    pregabalin (LYRICA) 200 MG capsule, Take 1 capsule by mouth 3 (three) times daily., Disp: , Rfl:    prochlorperazine (COMPAZINE) 10 MG tablet, TAKE 1 TABLET (10 MG TOTAL) BY MOUTH EVERY 6 (SIX) HOURS AS NEEDED (NAUSEA OR VOMITING)., Disp: 30 tablet, Rfl: 1   SODIUM FLUORIDE 5000 PPM 1.1 % PSTE, SMARTSIG:Sparingly Topical Every Night, Disp: , Rfl:   Allergies: No Known Allergies  Past Medical History, Surgical history, Social history, and Family History were reviewed and updated.  Review of Systems: Review of Systems  Constitutional:  Negative.   HENT:  Negative.    Eyes: Negative.   Respiratory: Negative.    Cardiovascular: Negative.   Gastrointestinal: Negative.   Endocrine: Negative.   Genitourinary: Negative.    Musculoskeletal: Negative.   Skin: Negative.   Neurological: Negative.   Hematological: Negative.   Psychiatric/Behavioral: Negative.     Physical Exam:  weight is 202 lb 1.3 oz (91.7 kg). His oral temperature is 97.8 F (36.6 C). His blood pressure is 121/64 and his pulse is 79. His respiration is 18 and oxygen saturation is 100%.   Wt Readings from Last 3 Encounters:  01/05/21 202 lb 1.3 oz (91.7 kg)  12/15/20 203 lb (92.1 kg)  12/04/20 203 lb (92.1 kg)    Physical Exam Vitals reviewed.  HENT:     Head: Normocephalic and atraumatic.  Eyes:     Pupils: Pupils are equal, round, and reactive to light.  Cardiovascular:     Rate and Rhythm: Normal rate and regular rhythm.     Heart sounds: Normal heart sounds.  Pulmonary:     Effort: Pulmonary effort is normal.     Breath sounds: Normal breath sounds.  Abdominal:     General: Bowel sounds are normal.     Palpations: Abdomen is soft.  Musculoskeletal:        General: No tenderness or deformity. Normal range of motion.     Cervical back: Normal range of motion.  Lymphadenopathy:     Cervical: No cervical adenopathy.  Skin:    General: Skin is warm and dry.     Findings: No erythema or rash.  Neurological:     Mental Status: He is alert and oriented to person, place, and time.  Psychiatric:        Behavior: Behavior normal.        Thought Content: Thought content normal.        Judgment: Judgment normal.     Lab Results  Component Value Date   WBC 2.0 (L) 01/05/2021   HGB 10.5 (L) 01/05/2021   HCT 31.6 (L) 01/05/2021   MCV 91.9 01/05/2021   PLT 233 01/05/2021     Chemistry      Component Value Date/Time   NA 138 01/05/2021 0953   K 3.7 01/05/2021 0953   CL 104 01/05/2021 0953   CO2 27 01/05/2021 0953   BUN 10 01/05/2021  0953   CREATININE 0.91 01/05/2021 0953   CREATININE 0.96 09/05/2020 0000      Component Value Date/Time   CALCIUM 8.9 01/05/2021 0953   ALKPHOS 111 01/05/2021 0953   AST 26 01/05/2021 0953   ALT 19 01/05/2021 0953   BILITOT 0.4 01/05/2021 1610  Impression and Plan: Mr. Silversmith is a very nice 65 year old white male.  He has metastatic adenocarcinoma of the GE junction..  This will be his third cycle of chemotherapy with Herceptin/nivolumab.    I we will reevaluate him after this  cycle of treatment.  Hopefully, he will have had a good response.  Again, we are going to have to add Neulasta to try to help keep his white cell count is up a little bit better.  Still like to give him an extra week off.  Hopefully, we can get his white cell count up a little bit more efficiently, then we can get him back to every 2-week cycles.   [name], MD 8/8/202211:13 AM

## 2021-01-05 NOTE — Patient Instructions (Signed)

## 2021-01-06 ENCOUNTER — Encounter: Payer: Self-pay | Admitting: *Deleted

## 2021-01-06 LAB — T4: T4, Total: 12.2 ug/dL — ABNORMAL HIGH (ref 4.5–12.0)

## 2021-01-06 NOTE — Progress Notes (Signed)
Patient needs PET scan scheduled prior to next treatment. PET scheduled for 01/19/2021. Radiology sheet completed with patient and appointment information. Will provide to patient tomorrow during his pump dc appointment.   Oncology Nurse Navigator Documentation  Oncology Nurse Navigator Flowsheets 01/06/2021  Abnormal Finding Date -  Confirmed Diagnosis Date -  Diagnosis Status -  Phase of Treatment -  Chemotherapy Actual Start Date: -  Chemotherapy Expected End Date: -  Navigator Follow Up Date: 01/28/2021  Navigator Follow Up Reason: Follow-up Appointment;Chemotherapy  Production assistant, radio Encounter Type Appt/Treatment Plan Review  Telephone -  Treatment Initiated Date -  Patient Visit Type MedOnc  Treatment Phase Active Tx  Barriers/Navigation Needs Coordination of Care;Education  Education Other  Interventions Coordination of Care;Education  Acuity Level 2-Minimal Needs (1-2 Barriers Identified)  Coordination of Care Radiology  Education Method Written  Support Groups/Services Friends and Family  Time Spent with Patient 30

## 2021-01-07 ENCOUNTER — Other Ambulatory Visit: Payer: Self-pay

## 2021-01-07 ENCOUNTER — Inpatient Hospital Stay: Payer: BC Managed Care – PPO

## 2021-01-07 VITALS — BP 126/56 | HR 76 | Temp 98.4°F | Resp 18

## 2021-01-07 DIAGNOSIS — C160 Malignant neoplasm of cardia: Secondary | ICD-10-CM

## 2021-01-07 MED ORDER — PEGFILGRASTIM-CBQV 6 MG/0.6ML ~~LOC~~ SOSY
6.0000 mg | PREFILLED_SYRINGE | Freq: Once | SUBCUTANEOUS | Status: AC
  Administered 2021-01-07: 6 mg via SUBCUTANEOUS
  Filled 2021-01-07: qty 0.6

## 2021-01-07 MED ORDER — SODIUM CHLORIDE 0.9% FLUSH
10.0000 mL | INTRAVENOUS | Status: DC | PRN
  Administered 2021-01-07: 10 mL
  Filled 2021-01-07: qty 10

## 2021-01-07 MED ORDER — HEPARIN SOD (PORK) LOCK FLUSH 100 UNIT/ML IV SOLN
500.0000 [IU] | Freq: Once | INTRAVENOUS | Status: AC | PRN
  Administered 2021-01-07: 500 [IU]
  Filled 2021-01-07: qty 5

## 2021-01-07 NOTE — Progress Notes (Signed)
PA okay for Udenyca today per Gaspar Bidding, Financial Advocate.

## 2021-01-07 NOTE — Patient Instructions (Signed)

## 2021-01-08 ENCOUNTER — Other Ambulatory Visit: Payer: Self-pay | Admitting: Family Medicine

## 2021-01-13 ENCOUNTER — Encounter: Payer: Self-pay | Admitting: Hematology & Oncology

## 2021-01-13 NOTE — Progress Notes (Signed)
The following biosimilar Fulphila ([id number]) has been selected for use in this patient. Per [name], Eddie Hernandez is not preferred. Per his plan pt can get neulasta, fulphila, or ziextenzo with no pa needed.

## 2021-01-19 ENCOUNTER — Inpatient Hospital Stay (HOSPITAL_COMMUNITY): Payer: BC Managed Care – PPO | Attending: Hematology | Admitting: Dietician

## 2021-01-19 ENCOUNTER — Encounter (HOSPITAL_COMMUNITY): Payer: BC Managed Care – PPO

## 2021-01-19 ENCOUNTER — Telehealth (HOSPITAL_COMMUNITY): Payer: Self-pay | Admitting: Dietician

## 2021-01-19 NOTE — Telephone Encounter (Signed)
Nutrition Follow-up:  Patient metastatic HER2+ adenocarcinoma of GE junction. He is receiving FOLFOX/Nivolumab/Herceptin with Neulasta support  Spoke with patient via telephone. He reports having diarrhea the past 2 days. Says he goes between diarrhea and constipation, finding the balance has been challenging. Patient reports his appetite somewhat decreased, usually eats 2 meals/day. He had toast, tea for breakfast, [place] sandwich around noon with olives, recalls fish taco (cod) salad, kale, broccoli, cauliflower, cukes, peppers, tomatoes, olives for dinner last night. Patient has mild cold sensitivity that lasts a few days, says he continues to drink cold things by holding them in his mouth for a few seconds before swallowing. He is drinking water and powerade. Patient is no longer drinking Premier protein, says this does not taste good. Patient has been unable to find Ensure supplements at the store. Patient reports drinking CIB with 2% milk most everyday. Patient is weighing himself daily, reports frequent ~10 lb fluctuations in weight from day to day.  Medications: reviewed   Labs: 8/8 - Glucose 106  Anthropometrics: Last weight 202 lb 1.3 oz on 8/8 stable   7/18 - 203 lb 6/21 - 201 lb  NUTRITION DIAGNOSIS: Inadequate oral intake ongoing   INTERVENTION:  Reviewed strategies for constipation and diarrhea, pt has handouts Encouraged eating smaller meals and snacks more frequently Recommended drinking Ensure Plus/equivalent with decreased appetite Continue drinking CIB daily Will mail Boost and Ensure coupons Recommended patient weigh once weekly vs daily, educated on normal daily fluctuations due to fluid shifts, constipation, diarrhea Patient has contact information    MONITORING, EVALUATION, GOAL: weight trends, intake   NEXT VISIT: via telephone 4-5 weeks

## 2021-01-20 ENCOUNTER — Encounter: Payer: Self-pay | Admitting: *Deleted

## 2021-01-20 ENCOUNTER — Other Ambulatory Visit: Payer: Self-pay | Admitting: Hematology & Oncology

## 2021-01-20 NOTE — Progress Notes (Signed)
PET scan rescheduled due to insurance pending review.   Oncology Nurse Navigator Documentation  Oncology Nurse Navigator Flowsheets 01/20/2021  Abnormal Finding Date -  Confirmed Diagnosis Date -  Diagnosis Status -  Phase of Treatment -  Chemotherapy Actual Start Date: -  Chemotherapy Expected End Date: -  Navigator Follow Up Date: 01/27/2021  Navigator Follow Up Reason: Scan Review  Navigator Location [facility]  Navigator Encounter Type Scan Review  Telephone -  Treatment Initiated Date -  Patient Visit Type MedOnc  Treatment Phase Active Tx  Barriers/Navigation Needs Coordination of Care;Education  Education -  Interventions None Required  Acuity Level 2-Minimal Needs (1-2 Barriers Identified)  Coordination of Care -  Education Method -  Support Groups/Services Friends and Family  Time Spent with Patient 15

## 2021-01-21 ENCOUNTER — Ambulatory Visit (INDEPENDENT_AMBULATORY_CARE_PROVIDER_SITE_OTHER): Payer: BC Managed Care – PPO | Admitting: Family Medicine

## 2021-01-21 ENCOUNTER — Other Ambulatory Visit: Payer: Self-pay

## 2021-01-21 ENCOUNTER — Encounter: Payer: Self-pay | Admitting: Family Medicine

## 2021-01-21 ENCOUNTER — Encounter: Payer: Self-pay | Admitting: Hematology & Oncology

## 2021-01-21 VITALS — BP 112/65 | HR 100 | Temp 98.5°F | Ht 72.0 in | Wt 202.0 lb

## 2021-01-21 DIAGNOSIS — Z23 Encounter for immunization: Secondary | ICD-10-CM | POA: Diagnosis not present

## 2021-01-21 DIAGNOSIS — I10 Essential (primary) hypertension: Secondary | ICD-10-CM

## 2021-01-21 DIAGNOSIS — E039 Hypothyroidism, unspecified: Secondary | ICD-10-CM

## 2021-01-21 DIAGNOSIS — R7301 Impaired fasting glucose: Secondary | ICD-10-CM | POA: Diagnosis not present

## 2021-01-21 DIAGNOSIS — E118 Type 2 diabetes mellitus with unspecified complications: Secondary | ICD-10-CM

## 2021-01-21 LAB — POCT GLYCOSYLATED HEMOGLOBIN (HGB A1C): Hemoglobin A1C: 6.8 % — AB (ref 4.0–5.6)

## 2021-01-21 MED ORDER — ESZOPICLONE 2 MG PO TABS: 2.0000 mg | ORAL_TABLET | Freq: Every evening | ORAL | 1 refills | Status: DC | PRN

## 2021-01-21 MED ORDER — AMBULATORY NON FORMULARY MEDICATION: 0 refills | Status: AC

## 2021-01-21 NOTE — Progress Notes (Signed)
Established Patient Office Visit  Subjective:  Patient ID: Eddie Hernandez, male    DOB: Oct 20, 1955  Age: 65 y.o. MRN: [id number]  CC:  Chief Complaint  Patient presents with   Hypertension   IFG    Hypothyroidism     HPI Eddie Hernandez presents for   Hypertension- Pt denies chest pain, SOB, dizziness, or heart palpitations.  Taking meds as directed w/o problems.  Denies medication side effects.    Diabetes - no hypoglycemic events. No wounds or sores that are not healing well. No increased thirst or urination. Checking glucose at home. Taking medications as prescribed without any side effects.  Hypothyroidism - Taking medication regularly in the AM away from food and vitamins, etc. No recent change to skin, hair, or energy levels.  Last TSH on August 8 was 0.89.  He is currently on 150 mcg daily and doing well.  He has been doing okay with his chemo he has been getting treatment every 2 weeks is just makes him feel very tired he gets a decreased appetite and he has noticed significant hair loss.  He has his first follow-up PET scan scheduled for next week so is little bit nervous about the results he is just hopeful that it is showing improvement after the chemotherapy.  He would like a refill on his Johnnye Sima which he uses as needed.   Past Medical History:  Diagnosis Date   Alcohol abuse    Bipolar 1 disorder ([place])    Constipation    Emphysema of lung ([place])    GE junction carcinoma ([place]) 10/14/2020   GERD (gastroesophageal reflux disease)    Hyperlipidemia    Hypertension    Hypothyroidism    Iron deficiency anemia due to chronic blood loss 10/15/2020   PAD (peripheral artery disease) ([facility])    Peripheral neuropathy    small fiber   Prostate pain    Stroke [facility])    Per CT scan - Old - pt was not aware    Tremor     Past Surgical History:  Procedure Laterality Date   brain stimulator removed     COLONOSCOPY     DEEP BRAIN STIMULATOR PLACEMENT  01-30-08   tremors    ILIAC ARTERY STENT     x2   IR IMAGING GUIDED PORT INSERTION  10/21/2020    Family History  Problem Relation Age of Onset   Stroke Father    Alcoholism Father    Cancer Sister        Lung    Colon cancer Neg Hx    Colon polyps Neg Hx    Esophageal cancer Neg Hx    Rectal cancer Neg Hx    Stomach cancer Neg Hx     Social History   Socioeconomic History   Marital status: Married    Spouse name: Maudry Mayhew    Number of children: Not on file   Years of education: Not on file   Highest education level: Not on file  Occupational History   Occupation: works in Scientist, research (medical).      Comment: [name]  Tobacco Use   Smoking status: Former    Packs/day: 1.00    Years: 45.00    Pack years: 45.00    Types: Cigarettes    Quit date: 09/08/2017    Years since quitting: 3.3   Smokeless tobacco: Never  Vaping Use   Vaping Use: Former   Substances: Nicotine  Substance and Sexual Activity   Alcohol  use: No    Alcohol/week: 0.0 standard drinks    Comment: hx of EtOH abuse   Drug use: No   Sexual activity: Not on file  Other Topics Concern   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 No active exercise.    Social Determinants of Health   Financial Resource Strain: Not on file  Food Insecurity: Not on file  Transportation Needs: Not on file  Physical Activity: Not on file  Stress: Not on file  Social Connections: Not on file  Intimate Partner Violence: Not on file    Outpatient Medications Prior to Visit  Medication Sig Dispense Refill   acetaminophen (TYLENOL) 500 MG tablet Take 1,000 mg by mouth every 6 (six) hours as needed.     ARIPiprazole (ABILIFY) 5 MG tablet TAKE ONE TABLET (5 MG TOTAL) BY MOUTH DAILY 30 tablet 3   cilostazol (PLETAL) 100 MG tablet Take 100 mg by mouth 2 (two) times daily.     dexamethasone (DECADRON) 4 MG tablet Take 2 tablets (8 mg total) by mouth daily. Start the day after chemotherapy for 2 days. Take with food. 30  tablet 1   dicyclomine (BENTYL) 10 MG capsule TAKE 1 CAPSULE (10 MG TOTAL) BY MOUTH 3 (THREE) TIMES DAILY BEFORE MEALS. 90 capsule 0   diphenoxylate-atropine (LOMOTIL) 2.5-0.025 MG tablet TAKE 2 TABLETS BY MOUTH 4 (FOUR) TIMES DAILY AS NEEDED FOR DIARRHEA OR LOOSE STOOLS. 100 tablet 0   DULoxetine (CYMBALTA) 60 MG capsule Take 60 mg by mouth daily.     levothyroxine (SYNTHROID) 150 MCG tablet Take 1 tablet (150 mcg total) by mouth daily before breakfast. 30 tablet 4   lidocaine-prilocaine (EMLA) cream Apply to affected area once 30 g 3   lisinopril (ZESTRIL) 20 MG tablet Take 20 mg by mouth daily.     lubiprostone (AMITIZA) 8 MCG capsule TAKE 1 CAPSULE (8 MCG TOTAL) BY MOUTH 2 (TWO) TIMES DAILY WITH A MEAL. 180 capsule 2   mirtazapine (REMERON SOL-TAB) 15 MG disintegrating tablet Take 15 mg by mouth daily.     NUVIGIL 250 MG tablet Take 250 mg by mouth daily.     ondansetron (ZOFRAN) 8 MG tablet Take 1 tablet (8 mg total) by mouth 2 (two) times daily as needed for refractory nausea / vomiting. Start on day 3 after chemotherapy. 30 tablet 1   pantoprazole (PROTONIX) 40 MG tablet Take 1 tablet by mouth daily.     pentoxifylline (TRENTAL) 400 MG CR tablet Take by mouth.     potassium chloride SA (KLOR-CON) 20 MEQ tablet Take 2 tablets (40 mEq total) by mouth 2 (two) times daily. 120 tablet 3   prednisoLONE acetate (PRED FORTE) 1 % ophthalmic suspension 1 drop 2 (two) times daily.     pregabalin (LYRICA) 200 MG capsule Take 1 capsule by mouth 3 (three) times daily.     prochlorperazine (COMPAZINE) 10 MG tablet TAKE 1 TABLET (10 MG TOTAL) BY MOUTH EVERY 6 (SIX) HOURS AS NEEDED (NAUSEA OR VOMITING). 30 tablet 1   SODIUM FLUORIDE 5000 PPM 1.1 % PSTE SMARTSIG:Sparingly Topical Every Night     eszopiclone (LUNESTA) 2 MG TABS tablet Take 2 mg by mouth at bedtime as needed.     No facility-administered medications prior to visit.    No Known Allergies  ROS Review of Systems    Objective:     Physical Exam Constitutional:      Appearance: Normal appearance.  He is well-developed.  HENT:     Head: Normocephalic and atraumatic.  Cardiovascular:     Rate and Rhythm: Normal rate and regular rhythm.     Heart sounds: Normal heart sounds.  Pulmonary:     Effort: Pulmonary effort is normal.     Breath sounds: Normal breath sounds.  Skin:    General: Skin is warm and dry.  Neurological:     Mental Status: He is alert and oriented to person, place, and time. Mental status is at baseline.  Psychiatric:        Behavior: Behavior normal.    BP 112/65   Pulse 100   Temp 98.5 F (36.9 C)   Ht 6' (1.829 m)   Wt 202 lb (91.6 kg)   SpO2 99%   BMI 27.40 kg/m  Wt Readings from Last 3 Encounters:  01/21/21 202 lb (91.6 kg)  01/05/21 202 lb 1.3 oz (91.7 kg)  12/15/20 203 lb (92.1 kg)     Health Maintenance Due  Topic Date Due   FOOT EXAM  Never done   OPHTHALMOLOGY EXAM  Never done   Pneumococcal Vaccine 60-59 Years old (2 - PCV) 07/25/2020    There are no preventive care reminders to display for this patient.  Lab Results  Component Value Date   TSH 0.899 01/05/2021   Lab Results  Component Value Date   WBC 2.0 (L) 01/05/2021   HGB 10.5 (L) 01/05/2021   HCT 31.6 (L) 01/05/2021   MCV 91.9 01/05/2021   PLT 233 01/05/2021   Lab Results  Component Value Date   NA 138 01/05/2021   K 3.7 01/05/2021   CO2 27 01/05/2021   GLUCOSE 106 (H) 01/05/2021   BUN 10 01/05/2021   CREATININE 0.91 01/05/2021   BILITOT 0.4 01/05/2021   ALKPHOS 111 01/05/2021   AST 26 01/05/2021   ALT 19 01/05/2021   PROT 5.9 (L) 01/05/2021   ALBUMIN 3.6 01/05/2021   CALCIUM 8.9 01/05/2021   ANIONGAP 7 01/05/2021   Lab Results  Component Value Date   CHOL 111 03/05/2020   Lab Results  Component Value Date   HDL 44 03/05/2020   Lab Results  Component Value Date   LDLCALC 50 03/05/2020   Lab Results  Component Value Date   TRIG 91 03/05/2020   Lab Results  Component Value  Date   CHOLHDL 2.5 03/05/2020   Lab Results  Component Value Date   HGBA1C 6.8 (A) 01/21/2021      Assessment & Plan:   Problem List Items Addressed This Visit       Cardiovascular and Mediastinum   Essential hypertension - Primary    Well controlled. Continue current regimen. Follow up in  6 mo         Endocrine   Hypothyroidism   Controlled diabetes mellitus type 2 with complications ([place])    He was previously being followed for impaired fasting glucose but unfortunately now his A1c is 6.8 in the diabetes range he actually is working with a nutritionist named Manuela Schwartz encouraged her to get in touch with her and have a discussion about dietary ways to get his A1c back down without having to start medication plan will be to follow back up in 3 months and see how he is doing.      Relevant Medications   AMBULATORY NON FORMULARY MEDICATION   Other Visit Diagnoses     Need for pneumococcal vaccination       Relevant Orders  Pneumococcal conjugate vaccine 20-valent (Completed)   Need for influenza vaccination       Relevant Orders   Flu Vaccine QUAD 33moIM (Fluarix, Fluzone & Alfiuria Quad PF) (Completed)   Need for shingles vaccine       Relevant Orders   Varicella-zoster vaccine IM (Completed)      Meds ordered this encounter  Medications   AMBULATORY NON FORMULARY MEDICATION    Sig: Medication Name: Glucometer and strips to test up to once a day. Dx diabetes    Dispense:  1 Units    Refill:  0   eszopiclone (LUNESTA) 2 MG TABS tablet    Sig: Take 1 tablet (2 mg total) by mouth at bedtime as needed.    Dispense:  30 tablet    Refill:  1     Meds ordered this encounter  Medications   AMBULATORY NON FORMULARY MEDICATION    Sig: Medication Name: Glucometer and strips to test up to once a day. Dx diabetes    Dispense:  1 Units    Refill:  0   eszopiclone (LUNESTA) 2 MG TABS tablet    Sig: Take 1 tablet (2 mg total) by mouth at bedtime as needed.    Dispense:   30 tablet    Refill:  1     Follow-up: Return in about 4 months (around 05/23/2021) for Diabetes follow-up.    CBeatrice Lecher MD

## 2021-01-21 NOTE — Patient Instructions (Signed)
Influenza (Flu) Vaccine (Inactivated or Recombinant): What You Need to Know 1. Why get vaccinated? Influenza vaccine can prevent influenza (flu). Flu is a contagious disease that spreads around the [place] every year, usually between October and May. Anyone can get the flu, but it is more dangerous for some people. Infants and young children, people 21 years and older, pregnant people, and people with certain health conditions or a weakenedimmune system are at greatest risk of flu complications. Pneumonia, bronchitis, sinus infections, and ear infections are examples of flu-related complications. If you have a medical condition, such as heartdisease, cancer, or diabetes, flu can make it worse. Flu can cause fever and chills, sore throat, muscle aches, fatigue, cough, headache, and runny or stuffy nose. Some people may have vomiting and diarrhea,though this is more common in children than adults. In an average year, thousands of people in the [place] die from flu, and many more are hospitalized. Flu vaccine prevents millions of illnessesand flu-related visits to the doctor each year. 2. Influenza vaccines CDC recommends everyone 6 months and older get vaccinated every flu season. Children 6 months through 14 years of age may need 2 doses during a single flu season. Everyone else needs only 1 dose each flu season. It takes about 2 weeks for protection to develop after vaccination. There are many flu viruses, and they are always changing. Each year a new flu vaccine is made to protect against the influenza viruses believed to be likely to cause disease in the upcoming flu season. Even when the vaccine doesn'texactly match these viruses, it may still provide some protection. Influenza vaccine does not cause flu. Influenza vaccine may be given at the same time as other vaccines. 3. Talk with your health care provider Tell your vaccination provider if the person getting the vaccine: Has had an  allergic reaction after a previous dose of influenza vaccine, or has any severe, life-threatening allergies Has ever had Guillain-Barr Syndrome (also called "GBS") In some cases, your health care provider may decide to postpone influenzavaccination until a future visit. Influenza vaccine can be administered at any time during pregnancy. People who are or will be pregnant during influenza season should receive inactivatedinfluenza vaccine. People with minor illnesses, such as a cold, may be vaccinated. People who are moderately or severely ill should usually wait until they recover beforegetting influenza vaccine. Your health care provider can give you more information. 4. Risks of a vaccine reaction Soreness, redness, and swelling where the shot is given, fever, muscle aches, and headache can happen after influenza vaccination. There may be a very small increased risk of Guillain-Barr Syndrome (GBS) after inactivated influenza vaccine (the flu shot). Young children who get the flu shot along with pneumococcal vaccine (PCV13) and/or DTaP vaccine at the same time might be slightly more likely to have a seizure caused by fever. Tell your health care provider if a child who isgetting flu vaccine has ever had a seizure. People sometimes faint after medical procedures, including vaccination. Tellyour provider if you feel dizzy or have vision changes or ringing in the ears. As with any medicine, there is a very remote chance of a vaccine causing asevere allergic reaction, other serious injury, or death. 5. What if there is a serious problem? An allergic reaction could occur after the vaccinated person leaves the clinic. If you see signs of a severe allergic reaction (hives, swelling of the face and throat, difficulty breathing, a fast heartbeat, dizziness, or weakness), call 9-1-1 and get the  person to the nearest hospital. For other signs that concern you, call your health care provider. Adverse reactions  should be reported to the Vaccine Adverse Event Reporting System (VAERS). Your health care provider will usually file this report, or you can do it yourself. Visit the VAERS website at www.vaers.SamedayNews.es or call 920-297-3064. VAERS is only for reporting reactions, and VAERS staff members do not give medical advice. 6. The National Vaccine Injury Compensation Program The [employer] Vaccine Injury Compensation Program (VICP) is a federal program that was created to compensate people who may have been injured by certain vaccines. Claims regarding alleged injury or death due to vaccination have a time limit for filing, which may be as short as two years. Visit the VICP website at GoldCloset.com.ee or call 617-512-8582 to learn about the program and about filing a claim. 7. How can I learn more? Ask your health care provider. Call your local or state health department. Visit the website of the Food and Drug Administration (FDA) for vaccine package inserts and additional information at TraderRating.uy. Contact the Centers for Disease Control and Prevention (CDC): Call 314 418 6316 (1-800-CDC-INFO) or Visit CDC's website at https://gibson.com/. Vaccine Information Statement Inactivated Influenza Vaccine (01/04/2020) This information is not intended to replace advice given to you by your health care provider. Make sure you discuss any questions you have with your healthcare provider. Document Revised: 02/21/2020 Document Reviewed: 02/21/2020 Elsevier Patient Education  [place].   Pneumococcal Conjugate Vaccine (PCV13): What You Need to Know 1. Why get vaccinated? Pneumococcal conjugate vaccine (PCV13) can prevent pneumococcal disease. Pneumococcal disease refers to any illness caused by pneumococcal bacteria. These bacteria can cause many types of illnesses, including pneumonia, which is an infection ofthe lungs. Pneumococcal bacteria are one of the  most common causes of pneumonia. Besides pneumonia, pneumococcal bacteria can also cause: Ear infections Sinus infections Meningitis (infection of the tissue covering the brain and spinal cord) Bacteremia (infection of the blood) Anyone can get pneumococcal disease, but children under 30 years old, people with certain medical conditions, adults 24 years or older, and cigarettesmokers are at the highest risk. Most pneumococcal infections are mild. However, some can result in long-term problems, such as brain damage or hearing loss. Meningitis, bacteremia, andpneumonia caused by pneumococcal disease can be fatal. 2. PCV13 PCV13 protects against 13 types of bacteria that cause pneumococcal disease. Infants and young children usually need 4 doses of pneumococcal conjugate vaccine, at ages 23, 94, 12, and 12-15 months. Older children (through age 82 months) may be vaccinated if they did not receive the recommended doses. A dose of PCV13 is also recommended for adults and children 6 years or olderwith certain medical conditions if they did not already receive PCV13. This vaccine may be given to healthy adults 93 years or older who did not already receive PCV13, based on discussions between the patientand health care provider. 3. Talk with your health care provider Tell your vaccination provider if the person getting the vaccine: Has had an allergic reaction after a previous dose of PCV13, to an earlier pneumococcal conjugate vaccine known as PCV7, or to any vaccine containing diphtheria toxoid (for example, DTaP), or has any severe, life-threatening allergies In some cases, your health care provider may decide to postpone PCV13vaccination until a future visit. People with minor illnesses, such as a cold, may be vaccinated. People who are moderately or severely ill should usually wait until they recover beforegetting PCV13. Your health care provider can give you more information. 4. Risks of  a vaccine  reaction Redness, swelling, pain, or tenderness where the shot is given, and fever, loss of appetite, fussiness (irritability), feeling tired, headache, and chills can happen after PCV13 vaccination. Young children may be at increased risk for seizures caused by fever after PCV13 if it is administered at the same time as inactivated influenza vaccine.Ask your health care provider for more information. People sometimes faint after medical procedures, including vaccination. Tellyour provider if you feel dizzy or have vision changes or ringing in the ears. As with any medicine, there is a very remote chance of a vaccine causing asevere allergic reaction, other serious injury, or death. 5. What if there is a serious problem? An allergic reaction could occur after the vaccinated person leaves the clinic. If you see signs of a severe allergic reaction (hives, swelling of the face and throat, difficulty breathing, a fast heartbeat, dizziness, or weakness), call 9-1-1 and get the person to the nearest hospital. For other signs that concern you, call your health care provider. Adverse reactions should be reported to the Vaccine Adverse Event Reporting System (VAERS). Your health care provider will usually file this report, or you can do it yourself. Visit the VAERS website at www.vaers.SamedayNews.es or call 647-034-7685. VAERS is only for reporting reactions, and VAERS staff members do not give medical advice. 6. The National Vaccine Injury Compensation Program The [employer] Vaccine Injury Compensation Program (VICP) is a federal program that was created to compensate people who may have been injured by certain vaccines. Claims regarding alleged injury or death due to vaccination have a time limit for filing, which may be as short as two years. Visit the VICP website at GoldCloset.com.ee or call 909-788-2877 to learn about the program and about filing a claim. 7. How can I learn more? Ask your health  care provider. Call your local or state health department. Visit the website of the Food and Drug Administration (FDA) for vaccine package inserts and additional information at TraderRating.uy. Contact the Centers for Disease Control and Prevention (CDC): Call (360) 804-3931 (1-800-CDC-INFO) or Visit CDC's website at http://hunter.com/. Vaccine Information Statement PCV13 (01/04/2020) This information is not intended to replace advice given to you by your health care provider. Make sure you discuss any questions you have with your healthcare provider. Document Revised: 02/21/2020 Document Reviewed: 02/21/2020 Elsevier Patient Education  [place].   Recombinant Zoster (Shingles) Vaccine: What You Need to Know 1. Why get vaccinated? Recombinant zoster (shingles) vaccine can prevent shingles. Shingles (also called herpes zoster, or just zoster) is a painful skin rash, usually with blisters. In addition to the rash, shingles can cause fever, headache, chills, or upset stomach. More rarely, shingles can lead to pneumonia, hearingproblems, blindness, brain inflammation (encephalitis), or death. The most common complication of shingles is long-term nerve pain called postherpetic neuralgia (PHN). PHN occurs in the areas where the shingles rash was, even after the rash clears up. It can last for months or years after therash goes away. The pain from PHN can be severe and debilitating. About 10 to 18% of people who get shingles will experience PHN. The risk of PHN increases with age. An older adult with shingles is more likely to develop PHN and have longer lasting and more severe pain than a younger person withshingles. Shingles is caused by the varicella zoster virus, the same virus that causes chickenpox. After you have chickenpox, the virus stays in your body and can cause shingles later in life. Shingles cannot be passed from one person to  another, but the virus  that causes shingles can spread and cause chickenpox insomeone who had never had chickenpox or received chickenpox vaccine. 2. Recombinant shingles vaccine Recombinant shingles vaccine provides strong protection against shingles. Bypreventing shingles, recombinant shingles vaccine also protects against PHN. Recombinant shingles vaccine is the preferred vaccine for the prevention of shingles. However, a different vaccine, live shingles vaccine, may be used in somecircumstances. The recombinant shingles vaccine is recommended for adults 50 years and older without serious immune problems. It is given as a two-dose series. This vaccine is also recommended for people who have already gotten another type of shingles vaccine, the live shingles vaccine. There is no live virus inthis vaccine. Shingles vaccine may be given at the same time as other vaccines. 3. Talk with your health care provider Tell your vaccine provider if the person getting the vaccine: Has had an allergic reaction after a previous dose of recombinant shingles vaccine, or has any severe, life-threatening allergies. Is pregnant or breastfeeding. Is currently experiencing an episode of shingles. In some cases, your health care provider may decide to postpone shinglesvaccination to a future visit. People with minor illnesses, such as a cold, may be vaccinated. People who are moderately or severely ill should usually wait until they recover beforegetting recombinant shingles vaccine. Your health care provider can give you more information. 4. Risks of a vaccine reaction A sore arm with mild or moderate pain is very common after recombinant shingles vaccine, affecting about 80% of vaccinated people. Redness and swelling can also happen at the site of the injection. Tiredness, muscle pain, headache, shivering, fever, stomach pain, and nausea happen after vaccination in more than half of people who receive recombinant shingles vaccine. In  clinical trials, about 1 out of 6 people who got recombinant zoster vaccine experienced side effects that prevented them from doing regular activities.Symptoms usually went away on their own in 2 to 3 days. You should still get the second dose of recombinant zoster vaccine even if youhad one of these reactions after the first dose. People sometimes faint after medical procedures, including vaccination. Tellyour provider if you feel dizzy or have vision changes or ringing in the ears. As with any medicine, there is a very remote chance of a vaccine causing asevere allergic reaction, other serious injury, or death. 5. What if there is a serious problem? An allergic reaction could occur after the vaccinated person leaves the clinic. If you see signs of a severe allergic reaction (hives, swelling of the face and throat, difficulty breathing, a fast heartbeat, dizziness, or weakness), call 9-1-1 and get the person to the nearest hospital. For other signs that concern you, call your health care provider. Adverse reactions should be reported to the Vaccine Adverse Event Reporting System (VAERS). Your health care provider will usually file this report, or you can do it yourself. Visit the VAERS website at www.vaers.SamedayNews.es or call (336) 883-6143. VAERS is only for reporting reactions, and VAERS staff do not give medical advice. 6. How can I learn more? Ask your health care provider. Call your local or state health department. Contact the Centers for Disease Control and Prevention (CDC): Call 209-846-8055 (1-800-CDC-INFO) or Visit CDC's website at http://hunter.com/ Vaccine Information Statement Recombinant Zoster Vaccine (03/29/2018) This information is not intended to replace advice given to you by your health care provider. Make sure you discuss any questions you have with your healthcare provider. Document Revised: 01/18/2020 Document Reviewed: 01/18/2020 Elsevier Patient Education  [place].

## 2021-01-21 NOTE — Assessment & Plan Note (Signed)
Well controlled. Continue current regimen. Follow up in  6 mo  

## 2021-01-21 NOTE — Assessment & Plan Note (Signed)
He was previously being followed for impaired fasting glucose but unfortunately now his A1c is 6.8 in the diabetes range he actually is working with a nutritionist named Manuela Schwartz encouraged her to get in touch with her and have a discussion about dietary ways to get his A1c back down without having to start medication plan will be to follow back up in 3 months and see how he is doing.

## 2021-01-22 ENCOUNTER — Emergency Department (HOSPITAL_COMMUNITY): Payer: BC Managed Care – PPO

## 2021-01-22 ENCOUNTER — Other Ambulatory Visit: Payer: Self-pay

## 2021-01-22 ENCOUNTER — Other Ambulatory Visit: Payer: Self-pay | Admitting: Family Medicine

## 2021-01-22 ENCOUNTER — Telehealth: Payer: Self-pay | Admitting: *Deleted

## 2021-01-22 ENCOUNTER — Emergency Department (HOSPITAL_COMMUNITY)
Admission: EM | Admit: 2021-01-22 | Discharge: 2021-01-22 | Disposition: A | Discharge status: Home / Self Care | Payer: BC Managed Care – PPO | Attending: Emergency Medicine | Admitting: Emergency Medicine

## 2021-01-22 ENCOUNTER — Encounter (HOSPITAL_COMMUNITY): Payer: Self-pay | Admitting: Emergency Medicine

## 2021-01-22 DIAGNOSIS — E1142 Type 2 diabetes mellitus with diabetic polyneuropathy: Secondary | ICD-10-CM | POA: Diagnosis not present

## 2021-01-22 DIAGNOSIS — R630 Anorexia: Secondary | ICD-10-CM | POA: Insufficient documentation

## 2021-01-22 DIAGNOSIS — Z20822 Contact with and (suspected) exposure to covid-19: Secondary | ICD-10-CM | POA: Insufficient documentation

## 2021-01-22 DIAGNOSIS — R197 Diarrhea, unspecified: Secondary | ICD-10-CM | POA: Insufficient documentation

## 2021-01-22 DIAGNOSIS — C169 Malignant neoplasm of stomach, unspecified: Secondary | ICD-10-CM | POA: Insufficient documentation

## 2021-01-22 DIAGNOSIS — Z79899 Other long term (current) drug therapy: Secondary | ICD-10-CM | POA: Insufficient documentation

## 2021-01-22 DIAGNOSIS — R1031 Right lower quadrant pain: Secondary | ICD-10-CM | POA: Diagnosis not present

## 2021-01-22 DIAGNOSIS — R531 Weakness: Secondary | ICD-10-CM | POA: Diagnosis not present

## 2021-01-22 DIAGNOSIS — R509 Fever, unspecified: Secondary | ICD-10-CM | POA: Diagnosis not present

## 2021-01-22 DIAGNOSIS — I10 Essential (primary) hypertension: Secondary | ICD-10-CM | POA: Diagnosis not present

## 2021-01-22 DIAGNOSIS — E039 Hypothyroidism, unspecified: Secondary | ICD-10-CM | POA: Diagnosis not present

## 2021-01-22 DIAGNOSIS — R110 Nausea: Secondary | ICD-10-CM | POA: Diagnosis not present

## 2021-01-22 DIAGNOSIS — Z87891 Personal history of nicotine dependence: Secondary | ICD-10-CM | POA: Insufficient documentation

## 2021-01-22 DIAGNOSIS — M791 Myalgia, unspecified site: Secondary | ICD-10-CM | POA: Insufficient documentation

## 2021-01-22 DIAGNOSIS — R000 Tachycardia, unspecified: Secondary | ICD-10-CM | POA: Insufficient documentation

## 2021-01-22 DIAGNOSIS — J449 Chronic obstructive pulmonary disease, unspecified: Secondary | ICD-10-CM | POA: Diagnosis not present

## 2021-01-22 DIAGNOSIS — I2510 Atherosclerotic heart disease of native coronary artery without angina pectoris: Secondary | ICD-10-CM | POA: Diagnosis not present

## 2021-01-22 LAB — CBC WITH DIFFERENTIAL/PLATELET
Abs Immature Granulocytes: 0.46 10*3/uL — ABNORMAL HIGH (ref 0.00–0.07)
Basophils Absolute: 0.1 10*3/uL (ref 0.0–0.1)
Basophils Relative: 1 %
Eosinophils Absolute: 0.2 10*3/uL (ref 0.0–0.5)
Eosinophils Relative: 2 %
HCT: 34.1 % — ABNORMAL LOW (ref 39.0–52.0)
Hemoglobin: 11.2 g/dL — ABNORMAL LOW (ref 13.0–17.0)
Immature Granulocytes: 5 %
Lymphocytes Relative: 12 %
Lymphs Abs: 1.2 10*3/uL (ref 0.7–4.0)
MCH: 30.9 pg (ref 26.0–34.0)
MCHC: 32.8 g/dL (ref 30.0–36.0)
MCV: 94.2 fL (ref 80.0–100.0)
Monocytes Absolute: 1.5 10*3/uL — ABNORMAL HIGH (ref 0.1–1.0)
Monocytes Relative: 15 %
Neutro Abs: 6.8 10*3/uL (ref 1.7–7.7)
Neutrophils Relative %: 65 %
Platelets: 185 10*3/uL (ref 150–400)
RBC: 3.62 MIL/uL — ABNORMAL LOW (ref 4.22–5.81)
RDW: 19.3 % — ABNORMAL HIGH (ref 11.5–15.5)
WBC: 10.3 10*3/uL (ref 4.0–10.5)
nRBC: 0 % (ref 0.0–0.2)

## 2021-01-22 LAB — COMPREHENSIVE METABOLIC PANEL
ALT: 23 U/L (ref 0–44)
AST: 37 U/L (ref 15–41)
Albumin: 3.6 g/dL (ref 3.5–5.0)
Alkaline Phosphatase: 126 U/L (ref 38–126)
Anion gap: 8 (ref 5–15)
BUN: <5 mg/dL — ABNORMAL LOW (ref 8–23)
CO2: 21 mmol/L — ABNORMAL LOW (ref 22–32)
Calcium: 8.5 mg/dL — ABNORMAL LOW (ref 8.9–10.3)
Chloride: 105 mmol/L (ref 98–111)
Creatinine, Ser: 0.99 mg/dL (ref 0.61–1.24)
GFR, Estimated: >60 mL/min (ref >60)
Glucose, Bld: 149 mg/dL — ABNORMAL HIGH (ref 70–99)
Potassium: 3.7 mmol/L (ref 3.5–5.1)
Sodium: 134 mmol/L — ABNORMAL LOW (ref 135–145)
Total Bilirubin: 0.7 mg/dL (ref 0.3–1.2)
Total Protein: 6.4 g/dL — ABNORMAL LOW (ref 6.5–8.1)

## 2021-01-22 LAB — URINALYSIS, ROUTINE W REFLEX MICROSCOPIC
Bilirubin Urine: NEGATIVE
Glucose, UA: 50 mg/dL — AB
Hgb urine dipstick: NEGATIVE
Ketones, ur: NEGATIVE mg/dL
Leukocytes,Ua: NEGATIVE
Nitrite: NEGATIVE
Protein, ur: NEGATIVE mg/dL
Specific Gravity, Urine: 1.013 (ref 1.005–1.030)
pH: 7 (ref 5.0–8.0)

## 2021-01-22 LAB — LACTIC ACID, PLASMA
Lactic Acid, Venous: 1.5 mmol/L (ref 0.5–1.9)
Lactic Acid, Venous: 1.5 mmol/L (ref 0.5–1.9)

## 2021-01-22 LAB — RESP PANEL BY RT-PCR (FLU A&B, COVID) ARPGX2
Influenza A by PCR: NEGATIVE
Influenza B by PCR: NEGATIVE
SARS Coronavirus 2 by RT PCR: NEGATIVE

## 2021-01-22 MED ORDER — HEPARIN SOD (PORK) LOCK FLUSH 100 UNIT/ML IV SOLN
500.0000 [IU] | Freq: Once | INTRAVENOUS | Status: AC
  Administered 2021-01-22: 500 [IU]
  Filled 2021-01-22: qty 5

## 2021-01-22 MED ORDER — PROCHLORPERAZINE EDISYLATE 10 MG/2ML IJ SOLN
10.0000 mg | Freq: Once | INTRAMUSCULAR | Status: AC
  Administered 2021-01-22: 10 mg via INTRAVENOUS
  Filled 2021-01-22: qty 2

## 2021-01-22 MED ORDER — ACETAMINOPHEN 325 MG PO TABS
650.0000 mg | ORAL_TABLET | Freq: Once | ORAL | Status: AC
  Administered 2021-01-22: 650 mg via ORAL
  Filled 2021-01-22: qty 2

## 2021-01-22 MED ORDER — LOPERAMIDE HCL 2 MG PO CAPS
4.0000 mg | ORAL_CAPSULE | Freq: Once | ORAL | Status: AC
  Administered 2021-01-22: 4 mg via ORAL
  Filled 2021-01-22: qty 2

## 2021-01-22 MED ORDER — IOHEXOL 350 MG/ML SOLN
80.0000 mL | Freq: Once | INTRAVENOUS | Status: AC | PRN
  Administered 2021-01-22: 80 mL via INTRAVENOUS

## 2021-01-22 MED ORDER — SODIUM CHLORIDE 0.9 % IV BOLUS
1000.0000 mL | Freq: Once | INTRAVENOUS | Status: AC
  Administered 2021-01-22: 1000 mL via INTRAVENOUS

## 2021-01-22 NOTE — ED Notes (Signed)
Patient to radiology.

## 2021-01-22 NOTE — Telephone Encounter (Signed)
Call received from patient's wife Maudry Mayhew stating that pt has a temperature of 100.8, has chills and body aches, slight nausea, productive cough and diarrhea times four that started this morning, no SOB.  Dr. Marin Olp notified.  Pt.'s wife notified per order of Dr. Marin Olp to take patient to the [facility] ER now.  Maudry Mayhew states that she will take pt to the [facility] ER now.

## 2021-01-22 NOTE — Discharge Instructions (Addendum)
Recommend close follow-up with oncologist. Return if pain worsens or with development of bloody stools or significant vomiting.

## 2021-01-22 NOTE — ED Triage Notes (Addendum)
Pt arrived via POV with c/o body aches, fever, diarrhea, and loss of appetite. Pt reports receiving three vaccines yesterday and started feeling bad this morning. Took a home covid test and it was negative. Pt also has stage 4 stomach cancer.

## 2021-01-22 NOTE — ED Provider Notes (Signed)
[facility] DEPT Provider Note   CSN: [id number] Arrival date & time: 01/22/21  1633     History Chief Complaint  Patient presents with   Generalized Body Aches    Eddie Hernandez is a 65 y.o. male.  Patient with past medical history of stage IV stomach cancer and prior stroke presents today for evaluation of generalized body aches. Patient states that he was feeling healthy yesterday when he went to his doctor and received pneumococcal, shingles, and flu vaccines. Patient woke up this morning with body aches, nausea, diarrhea, loss of appetite, weakness, and feeling unsteady on his feet. Checked temperature and found to be 101.8. Denies chest pain, shortness of breath, cough, congestion, or vomiting. Of note patient is currently receiving chemotherapy, last infusion three weeks ago. Port site looks non-infectious.  The history is provided by the patient. No language interpreter was used.      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   GERD (gastroesophageal reflux disease)    Hyperlipidemia    Hypertension    Hypothyroidism    Iron deficiency anemia due to chronic blood loss 10/15/2020   PAD (peripheral artery disease) ([place])    Peripheral neuropathy    small fiber   Prostate pain    Stroke [facility])    Per CT scan - Old - pt was not aware    Tremor     Patient Active Problem List   Diagnosis Date Noted   Iron deficiency anemia due to chronic blood loss 10/15/2020   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   Goals of care, counseling/discussion 10/14/2020   Atherosclerosis of native arteries of extremity with intermittent claudication ([place]) 10/09/2020   Elevated PSA 09/17/2020   Pulmonary nodule 09/15/2020   Chronic constipation 03/28/2020   Peripheral polyneuropathy 01/23/2020   Renal cyst, left 11/13/2019    Degenerative disc disease, cervical 11/13/2019   History of lacunar cerebrovascular accident 11/13/2019   Low testosterone 09/27/2019   Fracture left fifth toe proximal phalanx 09/13/2019   Tibialis posterior tendinitis, left 09/13/2019   Chronic pain of left thumb 07/26/2019   PAD (peripheral artery disease) ([place]) 10/07/2017   Centrilobular emphysema ([place]) 09/22/2017   Aortic atherosclerosis ([place]) 09/22/2017   Coronary artery disease involving native coronary artery of native heart without angina pectoris 09/08/2017   Essential hypertension 04/15/2017   Encounter for screening colonoscopy 08/12/2016   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   Dry mouth 09/23/2015   Numbness and tingling 09/23/2015   Polyuria 09/23/2015   Attempted suicide ([place]) 06/03/2014   Hyperglycemia 02/15/2013   Vitreous detachment of both eyes 08/10/2012   TOBACCO ABUSE 06/16/2010   TESTICULAR HYPOFUNCTION 10/08/2009   IBS 07/22/2009   COPD, group B, by GOLD 2017 classification ([place]) 04/21/2009   HIP PAIN, RIGHT 04/14/2009   LIBIDO, DECREASED 04/14/2009   Controlled diabetes mellitus type 2 with complications ([place]) 99991111   Hypothyroidism 04/13/2007   Hyperlipidemia 04/11/2006   Moderate depressed bipolar I disorder ([place]) 04/11/2006   ABUSE, ALCOHOL, UNSPECIFIED 04/11/2006   GERD 04/11/2006   Abnormal involuntary movement 04/11/2006    Past Surgical History:  Procedure Laterality Date   brain stimulator removed     COLONOSCOPY     DEEP BRAIN STIMULATOR PLACEMENT  01-30-08   tremors   ILIAC ARTERY STENT  x2   IR IMAGING GUIDED PORT INSERTION  10/21/2020       Family History  Problem Relation Age of Onset   Stroke Father    Alcoholism Father    Cancer Sister        Lung    Colon cancer Neg Hx    Colon polyps Neg Hx    Esophageal cancer Neg Hx    Rectal cancer Neg Hx    Stomach cancer Neg Hx     Social History   Tobacco Use   Smoking status: Former    Packs/day:  1.00    Years: 45.00    Pack years: 45.00    Types: Cigarettes    Quit date: 09/08/2017    Years since quitting: 3.3   Smokeless tobacco: Never  Vaping Use   Vaping Use: Former   Substances: Nicotine  Substance Use Topics   Alcohol use: No    Alcohol/week: 0.0 standard drinks    Comment: hx of EtOH abuse   Drug use: No    Home Medications Prior to Admission medications   Medication Sig Start Date End Date Taking? Authorizing Provider  acetaminophen (TYLENOL) 500 MG tablet Take 1,000 mg by mouth every 6 (six) hours as needed.    [provider]  AMBULATORY NON FORMULARY MEDICATION Medication Name: Glucometer and strips to test up to once a day. Dx diabetes 01/21/21   Hali Marry, MD  ARIPiprazole (ABILIFY) 5 MG tablet TAKE ONE TABLET (5 MG TOTAL) BY MOUTH DAILY 07/21/16   Hali Marry, MD  cilostazol (PLETAL) 100 MG tablet Take 100 mg by mouth 2 (two) times daily. 09/03/20   [provider]  dexamethasone (DECADRON) 4 MG tablet Take 2 tablets (8 mg total) by mouth daily. Start the day after chemotherapy for 2 days. Take with food. 10/16/20   [name], MD  dicyclomine (BENTYL) 10 MG capsule TAKE 1 CAPSULE (10 MG TOTAL) BY MOUTH 3 (THREE) TIMES DAILY BEFORE MEALS. 01/08/21   Hali Marry, MD  diphenoxylate-atropine (LOMOTIL) 2.5-0.025 MG tablet TAKE 2 TABLETS BY MOUTH 4 (FOUR) TIMES DAILY AS NEEDED FOR DIARRHEA OR LOOSE STOOLS. 01/21/21   [name], MD  DULoxetine (CYMBALTA) 60 MG capsule Take 60 mg by mouth daily. 11/18/20   [provider]  eszopiclone (LUNESTA) 2 MG TABS tablet Take 1 tablet (2 mg total) by mouth at bedtime as needed. 01/21/21   Hali Marry, MD  levothyroxine (SYNTHROID) 150 MCG tablet Take 1 tablet (150 mcg total) by mouth daily before breakfast. 12/04/20   [name], MD  lidocaine-prilocaine (EMLA) cream Apply to affected area once 10/16/20   [name], MD  lisinopril (ZESTRIL) 20 MG  tablet Take 20 mg by mouth daily. 12/21/19   Powers, Elyse Jarvis, MD  lubiprostone (AMITIZA) 8 MCG capsule TAKE 1 CAPSULE (8 MCG TOTAL) BY MOUTH 2 (TWO) TIMES DAILY WITH A MEAL. 11/10/20   Hali Marry, MD  mirtazapine (REMERON SOL-TAB) 15 MG disintegrating tablet Take 15 mg by mouth daily. 01/30/15   [provider]  NUVIGIL 250 MG tablet Take 250 mg by mouth daily. 03/18/15   [provider]  ondansetron (ZOFRAN) 8 MG tablet Take 1 tablet (8 mg total) by mouth 2 (two) times daily as needed for refractory nausea / vomiting. Start on day 3 after chemotherapy. 10/16/20   [name], MD  pantoprazole (PROTONIX) 40 MG tablet Take 1 tablet by mouth daily. 08/29/20  [provider]  pentoxifylline (TRENTAL) 400 MG CR tablet Take by mouth. 08/07/19   [provider]  potassium chloride SA (KLOR-CON) 20 MEQ tablet Take 2 tablets (40 mEq total) by mouth 2 (two) times daily. 12/04/20   [name], MD  prednisoLONE acetate (PRED FORTE) 1 % ophthalmic suspension 1 drop 2 (two) times daily. 12/09/20   [provider]  pregabalin (LYRICA) 200 MG capsule Take 1 capsule by mouth 3 (three) times daily. 07/24/19   [provider]  prochlorperazine (COMPAZINE) 10 MG tablet TAKE 1 TABLET (10 MG TOTAL) BY MOUTH EVERY 6 (SIX) HOURS AS NEEDED (NAUSEA OR VOMITING). 12/25/20   [name], MD  SODIUM FLUORIDE 5000 PPM 1.1 % PSTE SMARTSIG:Sparingly Topical Every Night 08/28/20   [provider]    Allergies    Patient has no known allergies.  Review of Systems   Review of Systems  Constitutional:  Positive for appetite change and fever.  Respiratory:  Negative for apnea, cough, chest tightness and shortness of breath.   Cardiovascular:  Negative for chest pain.  Gastrointestinal:  Positive for abdominal pain, diarrhea and nausea. Negative for abdominal distention, constipation and vomiting.  Neurological:  Positive for dizziness and weakness.  Negative for syncope and light-headedness.       Patient is unsteady   Psychiatric/Behavioral:  Negative for confusion.   All other systems reviewed and are negative.  Physical Exam Updated Vital Signs BP 140/66   Pulse (!) 106   Temp 99.7 F (37.6 C) (Oral)   Resp 19   Ht 6' (1.829 m)   Wt 91.6 kg   SpO2 93%   BMI 27.40 kg/m   Physical Exam Vitals and nursing note reviewed.  Constitutional:      General: He is not in acute distress. HENT:     Head: Normocephalic.     Mouth/Throat:     Mouth: Mucous membranes are moist.     Pharynx: No oropharyngeal exudate or posterior oropharyngeal erythema.  Eyes:     General: No scleral icterus.    Extraocular Movements: Extraocular movements intact.     Pupils: Pupils are equal, round, and reactive to light.  Cardiovascular:     Rate and Rhythm: Tachycardia present. Rhythm irregular.     Pulses:          Radial pulses are 2+ on the right side and 2+ on the left side.     Heart sounds: Normal heart sounds.  Pulmonary:     Effort: Pulmonary effort is normal.     Breath sounds: Normal breath sounds.  Abdominal:     General: Abdomen is flat. Bowel sounds are normal. There is no distension.     Palpations: Abdomen is soft. There is no mass.     Tenderness: There is abdominal tenderness in the right lower quadrant. There is no right CVA tenderness, left CVA tenderness, guarding or rebound. Positive signs include McBurney's sign. Negative signs include Murphy's sign, Rovsing's sign and psoas sign.  Musculoskeletal:        General: Normal range of motion.     Cervical back: Normal range of motion and neck supple.  Skin:    General: Skin is warm and dry.  Neurological:     General: No focal deficit present.     Mental Status: He is alert and oriented to person, place, and time. Mental status is at baseline.     Cranial Nerves: Cranial nerves are intact.  Motor: No weakness, tremor, atrophy, abnormal muscle tone, seizure activity or  pronator drift.     Coordination: Coordination normal. Finger-Nose-Finger Test and Heel to Shin Test normal.  Psychiatric:        Behavior: Behavior normal.    ED Results / Procedures / Treatments   Labs (all labs ordered are listed, but only abnormal results are displayed) Labs Reviewed  CBC WITH DIFFERENTIAL/PLATELET - Abnormal; Notable for the following components:      Result Value   RBC 3.62 (*)    Hemoglobin 11.2 (*)    HCT 34.1 (*)    RDW 19.3 (*)    Monocytes Absolute 1.5 (*)    Abs Immature Granulocytes 0.46 (*)    All other components within normal limits  COMPREHENSIVE METABOLIC PANEL - Abnormal; Notable for the following components:   Sodium 134 (*)    CO2 21 (*)    Glucose, Bld 149 (*)    BUN <5 (*)    Calcium 8.5 (*)    Total Protein 6.4 (*)    All other components within normal limits  URINALYSIS, ROUTINE W REFLEX MICROSCOPIC - Abnormal; Notable for the following components:   Glucose, UA 50 (*)    All other components within normal limits  RESP PANEL BY RT-PCR (FLU A&B, COVID) ARPGX2  CULTURE, BLOOD (ROUTINE X 2)  CULTURE, BLOOD (ROUTINE X 2)  LACTIC ACID, PLASMA  LACTIC ACID, PLASMA    EKG None  Radiology DG Chest 2 View  Result Date: 01/22/2021 CLINICAL DATA:  Fever EXAM: CHEST - 2 VIEW COMPARISON:  01/21/2017 FINDINGS: Left Port-A-Cath in place with the tip in the SVC. Heart is normal size. No effusions. No confluent airspace opacities. Biapical scarring. IMPRESSION: No active cardiopulmonary disease. Electronically Signed   By: Rolm Baptise M.D.   On: 01/22/2021 17:50   CT HEAD WO CONTRAST (5MM)  Result Date: 01/22/2021 CLINICAL DATA:  Ataxia and head trauma. Body aches, fever, diarrhea, and loss of appetite. Patient feels bad today after receiving 3 vaccines yesterday. Negative COVID test. Stage IV stomach cancer. EXAM: CT HEAD WITHOUT CONTRAST TECHNIQUE: Contiguous axial images were obtained from the base of the skull through the vertex without  intravenous contrast. COMPARISON:  10/22/2020 FINDINGS: Brain: No evidence of acute infarction, hemorrhage, hydrocephalus, extra-axial collection or mass lesion/mass effect. Bilateral trans frontal stimulator leads with tips terminating in the thalamic regions. No change in positioning since previous study. Vascular: No hyperdense vessel or unexpected calcification. Skull: No acute depressed skull fractures. No destructive bone lesions. Sinuses/Orbits: Retention cyst in the right maxillary antrum. Paranasal sinuses and mastoid air cells are otherwise clear. Other: None. IMPRESSION: No acute intracranial abnormalities. Bilateral frontal stimulator leads without change in position. Electronically Signed   By: Lucienne Capers M.D.   On: 01/22/2021 20:46   CT ABDOMEN PELVIS W CONTRAST  Result Date: 01/22/2021 CLINICAL DATA:  Acute abdominal pain. Body aches, fever, diarrhea, loss of appetite. GE junction carcinoma. EXAM: CT ABDOMEN AND PELVIS WITH CONTRAST TECHNIQUE: Multidetector CT imaging of the abdomen and pelvis was performed using the standard protocol following bolus administration of intravenous contrast. CONTRAST:  80m OMNIPAQUE IOHEXOL 350 MG/ML SOLN COMPARISON:  PET CT 10/01/2020, abdominopelvic CT 09/08/2020 FINDINGS: Lower chest: Overall improvement of basilar pulmonary nodules from prior exam. No pleural effusion or acute airspace disease. Hepatobiliary: The hypermetabolic liver lesion on prior PET is only faintly/tentatively visualized, series 2, image 18. No evidence of new or additional liver lesions. Gallbladder physiologically distended, no calcified stone.  No biliary dilatation. Pancreas: No ductal dilatation or inflammation. Spleen: Normal in size without focal abnormality. Adrenals/Urinary Tract: No adrenal nodule. No hydronephrosis. Homogeneous renal enhancement with symmetric excretion on delayed phase imaging. Bilateral renal cysts. No suspicious renal lesion. Urinary bladder is  physiologically distended without wall thickening. Stomach/Bowel: Bowel evaluation is limited in the absence of enteric contrast. There is mild wall thickening of the gastroesophageal junction in the region of hypermetabolism on prior PET, series 2, images 16 and 19. few fluid-filled loops of small bowel in the pelvis without wall thickening or inflammation. Normal appendix. Fluid/liquid stool in the proximal colon. Left colon is primarily nondistended with minimal formed liquid stool. There is no colonic wall thickening or pericolonic edema. Vascular/Lymphatic: Aortic atherosclerosis with both calcified noncalcified atheromatous plaque. Irregular plaque in the suprarenal aorta causes mild (less than 50%) luminal narrowing. Bi-iliac stents are in place. Mild retroperitoneal stranding about the distal aorta slightly increased. Retroperitoneal stranding about the upper retroperitoneum similar or slightly increased. Previous low aortocaval node that was hypermetabolic on PET measures 6 mm, previously 9 mm. There are multiple additional small retroperitoneal lymph nodes are not enlarged by size criteria. Patent portal vein. Reproductive: Prostate is unremarkable. Other: No ascites. No omental thickening. Minimal fat in both inguinal canals. No free air or focal fluid collection. Musculoskeletal: There are no acute or suspicious osseous abnormalities. Small right iliac sclerotic focus is unchanged from prior CT and not hypermetabolic on PET. IMPRESSION: 1. Fluid/liquid stool in the proximal colon, can be seen with diarrheal illness. No bowel inflammation or obstruction. 2. Mild wall thickening of the gastroesophageal junction in the region of hypermetabolism on prior PET, consistent with known malignancy. Previous low aortocaval node that was hypermetabolic on PET measures 6 mm, previously 9 mm. 3. The hypermetabolic liver lesion on prior PET is only faintly/tentatively visualized. No evidence of new or additional liver  lesions. 4. Retroperitoneal stranding about the distal aorta and upper retroperitoneum is similar or slightly increased from prior imaging. 5. Improved basilar pulmonary nodules from prior exam. Aortic Atherosclerosis (ICD10-I70.0). Electronically Signed   By: Keith Rake M.D.   On: 01/22/2021 20:52    Procedures Procedures   Medications Ordered in ED Medications  sodium chloride 0.9 % bolus 1,000 mL (0 mLs Intravenous Stopped 01/22/21 1904)  acetaminophen (TYLENOL) tablet 650 mg (650 mg Oral Given 01/22/21 1841)  loperamide (IMODIUM) capsule 4 mg (4 mg Oral Given 01/22/21 1953)  iohexol (OMNIPAQUE) 350 MG/ML injection 80 mL (80 mLs Intravenous Contrast Given 01/22/21 2029)  prochlorperazine (COMPAZINE) injection 10 mg (10 mg Intravenous Given 01/22/21 2056)  heparin lock flush 100 unit/mL (500 Units Intracatheter Given 01/22/21 2156)    ED Course  I have reviewed the triage vital signs and the nursing notes.  Pertinent labs & imaging results that were available during my care of the patient were reviewed by me and considered in my medical decision making (see chart for details).  Immunocompromised patient during active chemotherapy regimen presents with new onset documented fever and tachycardia. Sepsis workup initiated.   CBC: no leukocytosis or leukopenia noted. Hemoglobin noted to be slightly lower than normal but is at patients baseline CMP: unremarkable Lactic acid: unremarkable COVID: negative Urinalysis: unremarkable EKG: patient noted to be in ventricular bigeminy    Over course of stay, patient has had multiple bouts of diarrhea and despite fever control and fluids, still feels unsteady and like he might fall. Also complains of 'twitching' that started over the past few weeks. No focal  deficits appreciated, however due to metastatic disease and stroke history, chose to order CT head to rule out further metastasis or new vascular injury. Upon second abdominal exam, patient noted  to be more tender in right lower quadrant, this combined with ongoing diarrhea resulted in decision to order CT abdomen/pelvis.   CT head: unremarkable CT abdomen/pelvis: showed known watery stools present in bowel but otherwise unremarkable   MDM Rules/Calculators/A&P                         Immunocompromised patient currently undergoing chemotherapy presented for new fever, diarrhea, and unsteadiness. Completed sepsis workup which was unremarkable. COVID test negative. Imaging for acute abdomen and acute intracranial abnormality was negative. Patient is no longer febrile at this time, heart rate has come down, he is feeling better now and was able to ambulate without difficulty. Thoughts are that this could be a post shingles vaccine reaction vs chemotherapy side effects vs viral process. Discussed patient with attending and oncology on call who both agreed with plan to discharge patient with follow-up with oncologist managing patients care. Patient is amenable with this plan and knows to return if symptoms worsen. Patient has nausea/diarrhea medication at home that he can use to manage symptoms.   Final Clinical Impression(s) / ED Diagnoses Final diagnoses:  Diarrhea, unspecified type  Fever, unspecified fever cause    Rx / DC Orders ED Discharge Orders     None     An After Visit Summary was printed and given to the patient.    Bud Face, [place] 01/22/21 2158    Lacretia Leigh, MD 01/27/21 [phone]

## 2021-01-22 NOTE — ED Notes (Signed)
Pt. Documented in error see above note in chart. 

## 2021-01-22 NOTE — ED Provider Notes (Signed)
5:23 PM Patient seen in conjunction with [place] PA-C.  Patient with history of stomach cancer, last chemo infusion approximately 3 weeks ago through left upper chest port --presents the emergency department today for fever to 100.8 F at home, diarrhea, body aches, decreased appetite.  He received 3 vaccines yesterday (pneumococcal, influenza, shingles).  He denies vomiting or other URI symptoms.  He has had some abdominal cramping.  No sick contacts.  He also reports feeling very off balance at home, like he is going to fall backwards.  Denies lightheadedness.  Patient denies other signs of stroke including: facial droop, slurred speech, aphasia, weakness/numbness in extremities.  BP 137/72 (BP Location: Right Arm)   Pulse (!) 136   Temp 98.9 F (37.2 C) (Oral)   Resp 18   Ht 6' (1.829 m)   Wt 91.6 kg   SpO2 94%   BMI 27.40 kg/m   8:18 PM Work-up thus far is reassuring, but patient continues to feel poorly. Three episodes of diarrhea in ED, still feeling off balance. Will CT abd/pelvis, CT head, ambulate patient to assess functional status.   BP 123/73   Pulse 100   Temp 99.7 F (37.6 C) (Oral)   Resp 18   Ht 6' (1.829 m)   Wt 91.6 kg   SpO2 100%   BMI 27.40 kg/m   9:53 PM patient ambulated well.  Discussed all results with patient and family at bedside.  Also discussed with Dr. Zenia Resides.  Plan for discharge to home with close outpatient follow-up.  We did speak with Dr. Lindi Adie with oncology.  He asked me to send a message to Dr. Marin Olp for outpatient follow-up.  The patient was urged to return to the Emergency Department immediately with worsening of current symptoms, worsening abdominal pain, persistent vomiting, blood noted in stools, fever, or any other concerns. The patient verbalized understanding.     Carlisle Cater, PA-C 01/22/21 2154    Lacretia Leigh, MD 01/25/21 [phone]

## 2021-01-22 NOTE — ED Notes (Signed)
Pt. Ambulated to the bathroom and back to is room without assistance. Pt. Gait steady on his feet.

## 2021-01-23 ENCOUNTER — Other Ambulatory Visit: Payer: Self-pay | Admitting: *Deleted

## 2021-01-23 ENCOUNTER — Inpatient Hospital Stay: Payer: BC Managed Care – PPO

## 2021-01-23 ENCOUNTER — Telehealth: Payer: Self-pay | Admitting: General Practice

## 2021-01-23 VITALS — BP 103/54 | HR 68 | Temp 98.6°F | Resp 18

## 2021-01-23 DIAGNOSIS — R110 Nausea: Secondary | ICD-10-CM

## 2021-01-23 DIAGNOSIS — C160 Malignant neoplasm of cardia: Secondary | ICD-10-CM | POA: Diagnosis not present

## 2021-01-23 DIAGNOSIS — K580 Irritable bowel syndrome with diarrhea: Secondary | ICD-10-CM

## 2021-01-23 MED ORDER — SODIUM CHLORIDE 0.9 % IV SOLN: Freq: Once | INTRAVENOUS | Status: AC

## 2021-01-23 MED ORDER — SODIUM CHLORIDE 0.9% FLUSH
10.0000 mL | Freq: Once | INTRAVENOUS | Status: AC
  Administered 2021-01-23: 10 mL via INTRAVENOUS

## 2021-01-23 MED ORDER — HEPARIN SOD (PORK) LOCK FLUSH 100 UNIT/ML IV SOLN
500.0000 [IU] | Freq: Once | INTRAVENOUS | Status: AC
  Administered 2021-01-23: 500 [IU] via INTRAVENOUS

## 2021-01-23 MED ORDER — SODIUM CHLORIDE 0.9 % IV SOLN
8.0000 mg | Freq: Once | INTRAVENOUS | Status: AC
  Administered 2021-01-23: 8 mg via INTRAVENOUS
  Filled 2021-01-23: qty 4

## 2021-01-23 MED ORDER — DEXAMETHASONE SODIUM PHOSPHATE 10 MG/ML IJ SOLN: 20.0000 mg | Freq: Once | INTRAMUSCULAR | Status: DC

## 2021-01-23 MED ORDER — SODIUM CHLORIDE 0.9 % IV SOLN
20.0000 mg | Freq: Once | INTRAVENOUS | Status: DC
  Filled 2021-01-23: qty 2

## 2021-01-23 MED ORDER — SODIUM CHLORIDE 0.9 % IV SOLN: 8.0000 mg | Freq: Once | INTRAVENOUS | Status: DC

## 2021-01-23 MED ORDER — PROCHLORPERAZINE MALEATE 10 MG PO TABS
10.0000 mg | ORAL_TABLET | Freq: Four times a day (QID) | ORAL | 1 refills | Status: DC | PRN
Start: 2021-01-23 — End: 2021-04-20

## 2021-01-23 MED ORDER — SODIUM CHLORIDE 0.9 % IV SOLN
20.0000 mg | Freq: Once | INTRAVENOUS | Status: AC
  Administered 2021-01-23: 20 mg via INTRAVENOUS
  Filled 2021-01-23: qty 20

## 2021-01-23 NOTE — Patient Instructions (Signed)

## 2021-01-23 NOTE — Telephone Encounter (Signed)
Transition Care Management Follow-up Telephone Call Date of discharge and from where: 01/22/21 from [facility] How have you been since you were released from the hospital? Still nauseous but no diarrhea. Any questions or concerns? No  Items Reviewed: Did the pt receive and understand the discharge instructions provided? No  Medications obtained and verified? Yes  Other? No  Any new allergies since your discharge? No  Dietary orders reviewed? Yes Do you have support at home? Yes   Home Care and Equipment/Supplies: Were home health services ordered? no   Functional Questionnaire: (I = Independent and D = Dependent) ADLs: I  Bathing/Dressing- I  Meal Prep- I  Eating- I  Maintaining continence- I  Transferring/Ambulation- I  Managing Meds- I  Follow up appointments reviewed:  PCP Hospital f/u appt confirmed? No  He will call back to schedule. [facility] f/u appt confirmed? No  He has an appointment with the Oncologist on 01/28/21 Are transportation arrangements needed? No  If their condition worsens, is the pt aware to call PCP or go to the Emergency Dept.? Yes Was the patient provided with contact information for the PCP's office or ED? Yes Was to pt encouraged to call back with questions or concerns? Yes

## 2021-01-27 ENCOUNTER — Encounter: Payer: Self-pay | Admitting: *Deleted

## 2021-01-27 ENCOUNTER — Other Ambulatory Visit: Payer: Self-pay

## 2021-01-27 ENCOUNTER — Ambulatory Visit (HOSPITAL_COMMUNITY)
Admission: RE | Admit: 2021-01-27 | Discharge: 2021-01-27 | Disposition: A | Discharge status: Home / Self Care | Payer: BC Managed Care – PPO | Source: Ambulatory Visit | Attending: Hematology & Oncology | Admitting: Hematology & Oncology

## 2021-01-27 DIAGNOSIS — C160 Malignant neoplasm of cardia: Secondary | ICD-10-CM | POA: Insufficient documentation

## 2021-01-27 LAB — CULTURE, BLOOD (ROUTINE X 2)
Culture: NO GROWTH
Culture: NO GROWTH
Special Requests: ADEQUATE

## 2021-01-27 LAB — GLUCOSE, CAPILLARY: Glucose-Capillary: 144 mg/dL — ABNORMAL HIGH (ref 70–99)

## 2021-01-27 MED ORDER — FLUDEOXYGLUCOSE F - 18 (FDG) INJECTION
10.3000 | Freq: Once | INTRAVENOUS | Status: AC
  Administered 2021-01-27: 10.3 via INTRAVENOUS

## 2021-01-27 NOTE — Progress Notes (Signed)
Oncology Nurse Navigator Documentation  Oncology Nurse Navigator Flowsheets 01/27/2021  Abnormal Finding Date -  Confirmed Diagnosis Date -  Diagnosis Status -  Phase of Treatment -  Chemotherapy Actual Start Date: -  Chemotherapy Expected End Date: -  Navigator Follow Up Date: 01/28/2021  Navigator Follow Up Reason: Follow-up Appointment;Chemotherapy  Navigator Restaurant manager, fast food Encounter Type Scan Review  Telephone -  Treatment Initiated Date -  Patient Visit Type MedOnc  Treatment Phase Active Tx  Barriers/Navigation Needs Coordination of Care;Education  Education -  Interventions None Required  Acuity Level 2-Minimal Needs (1-2 Barriers Identified)  Coordination of Care -  Education Method -  Support Groups/Services Friends and Family  Time Spent with Patient 15

## 2021-01-28 ENCOUNTER — Encounter: Payer: Self-pay | Admitting: *Deleted

## 2021-01-28 ENCOUNTER — Inpatient Hospital Stay (HOSPITAL_BASED_OUTPATIENT_CLINIC_OR_DEPARTMENT_OTHER): Payer: BC Managed Care – PPO | Admitting: Hematology & Oncology

## 2021-01-28 ENCOUNTER — Inpatient Hospital Stay: Payer: BC Managed Care – PPO

## 2021-01-28 ENCOUNTER — Encounter: Payer: Self-pay | Admitting: Hematology & Oncology

## 2021-01-28 VITALS — BP 133/75 | HR 87 | Temp 97.7°F | Resp 17 | Ht 72.0 in | Wt 199.1 lb

## 2021-01-28 DIAGNOSIS — C160 Malignant neoplasm of cardia: Secondary | ICD-10-CM

## 2021-01-28 LAB — CMP (CANCER CENTER ONLY)
ALT: 24 U/L (ref 0–44)
AST: 29 U/L (ref 15–41)
Albumin: 3.6 g/dL (ref 3.5–5.0)
Alkaline Phosphatase: 93 U/L (ref 38–126)
Anion gap: 7 (ref 5–15)
BUN: 18 mg/dL (ref 8–23)
CO2: 27 mmol/L (ref 22–32)
Calcium: 9.1 mg/dL (ref 8.9–10.3)
Chloride: 108 mmol/L (ref 98–111)
Creatinine: 0.92 mg/dL (ref 0.61–1.24)
GFR, Estimated: >60 mL/min (ref >60)
Glucose, Bld: 90 mg/dL (ref 70–99)
Potassium: 3.7 mmol/L (ref 3.5–5.1)
Sodium: 142 mmol/L (ref 135–145)
Total Bilirubin: 0.4 mg/dL (ref 0.3–1.2)
Total Protein: 6.1 g/dL — ABNORMAL LOW (ref 6.5–8.1)

## 2021-01-28 LAB — CBC WITH DIFFERENTIAL (CANCER CENTER ONLY)
Abs Immature Granulocytes: 0.42 10*3/uL — ABNORMAL HIGH (ref 0.00–0.07)
Basophils Absolute: 0.1 10*3/uL (ref 0.0–0.1)
Basophils Relative: 1 %
Eosinophils Absolute: 0.2 10*3/uL (ref 0.0–0.5)
Eosinophils Relative: 2 %
HCT: 31 % — ABNORMAL LOW (ref 39.0–52.0)
Hemoglobin: 10.3 g/dL — ABNORMAL LOW (ref 13.0–17.0)
Immature Granulocytes: 5 %
Lymphocytes Relative: 15 %
Lymphs Abs: 1.4 10*3/uL (ref 0.7–4.0)
MCH: 30.7 pg (ref 26.0–34.0)
MCHC: 33.2 g/dL (ref 30.0–36.0)
MCV: 92.5 fL (ref 80.0–100.0)
Monocytes Absolute: 1.2 10*3/uL — ABNORMAL HIGH (ref 0.1–1.0)
Monocytes Relative: 13 %
Neutro Abs: 5.8 10*3/uL (ref 1.7–7.7)
Neutrophils Relative %: 64 %
Platelet Count: 223 10*3/uL (ref 150–400)
RBC: 3.35 MIL/uL — ABNORMAL LOW (ref 4.22–5.81)
RDW: 18.5 % — ABNORMAL HIGH (ref 11.5–15.5)
WBC Count: 9.1 10*3/uL (ref 4.0–10.5)
nRBC: 0 % (ref 0.0–0.2)

## 2021-01-28 LAB — FERRITIN: Ferritin: 100 ng/mL (ref 24–336)

## 2021-01-28 LAB — TSH: TSH: 1.087 u[IU]/mL (ref 0.320–4.118)

## 2021-01-28 LAB — IRON AND TIBC
Iron: 73 ug/dL (ref 42–163)
Saturation Ratios: 20 % (ref 20–55)
TIBC: 366 ug/dL (ref 202–409)
UIBC: 294 ug/dL (ref 117–376)

## 2021-01-28 LAB — LACTATE DEHYDROGENASE: LDH: 251 U/L — ABNORMAL HIGH (ref 98–192)

## 2021-01-28 MED ORDER — TRASTUZUMAB-DKST CHEMO 150 MG IV SOLR
4.0000 mg/kg | Freq: Once | INTRAVENOUS | Status: AC
  Administered 2021-01-28: 357 mg via INTRAVENOUS
  Filled 2021-01-28: qty 17

## 2021-01-28 MED ORDER — SODIUM CHLORIDE 0.9 % IV SOLN: Freq: Once | INTRAVENOUS | Status: AC

## 2021-01-28 MED ORDER — PALONOSETRON HCL INJECTION 0.25 MG/5ML
0.2500 mg | Freq: Once | INTRAVENOUS | Status: AC
Start: 2021-01-28 — End: 2021-01-28
  Administered 2021-01-28: 0.25 mg via INTRAVENOUS
  Filled 2021-01-28: qty 5

## 2021-01-28 MED ORDER — HEPARIN SOD (PORK) LOCK FLUSH 100 UNIT/ML IV SOLN: 500.0000 [IU] | Freq: Once | INTRAVENOUS | Status: DC | PRN

## 2021-01-28 MED ORDER — DEXTROSE 5 % IV SOLN: Freq: Once | INTRAVENOUS | Status: AC

## 2021-01-28 MED ORDER — SODIUM CHLORIDE 0.9 % IV SOLN
10.0000 mg | Freq: Once | INTRAVENOUS | Status: AC
  Administered 2021-01-28: 10 mg via INTRAVENOUS
  Filled 2021-01-28: qty 10

## 2021-01-28 MED ORDER — SODIUM CHLORIDE 0.9 % IV SOLN
1920.0000 mg/m2 | INTRAVENOUS | Status: DC
  Administered 2021-01-28: 4150 mg via INTRAVENOUS
  Filled 2021-01-28: qty 83

## 2021-01-28 MED ORDER — DIPHENHYDRAMINE HCL 25 MG PO CAPS
50.0000 mg | ORAL_CAPSULE | Freq: Once | ORAL | Status: AC
  Administered 2021-01-28: 50 mg via ORAL
  Filled 2021-01-28: qty 2

## 2021-01-28 MED ORDER — ACETAMINOPHEN 325 MG PO TABS
650.0000 mg | ORAL_TABLET | Freq: Once | ORAL | Status: AC
  Administered 2021-01-28: 650 mg via ORAL
  Filled 2021-01-28: qty 2

## 2021-01-28 MED ORDER — SODIUM CHLORIDE 0.9% FLUSH: 10.0000 mL | INTRAVENOUS | Status: DC | PRN

## 2021-01-28 MED ORDER — FLUOROURACIL CHEMO INJECTION 2.5 GM/50ML
320.0000 mg/m2 | Freq: Once | INTRAVENOUS | Status: AC
  Administered 2021-01-28: 700 mg via INTRAVENOUS
  Filled 2021-01-28: qty 14

## 2021-01-28 MED ORDER — OXALIPLATIN CHEMO INJECTION 100 MG/20ML
69.0000 mg/m2 | Freq: Once | INTRAVENOUS | Status: AC
  Administered 2021-01-28: 150 mg via INTRAVENOUS
  Filled 2021-01-28: qty 20

## 2021-01-28 MED ORDER — LEUCOVORIN CALCIUM INJECTION 350 MG
400.0000 mg/m2 | Freq: Once | INTRAVENOUS | Status: AC
  Administered 2021-01-28: 864 mg via INTRAVENOUS
  Filled 2021-01-28: qty 43.2

## 2021-01-28 NOTE — Progress Notes (Signed)
Hematology and Oncology Follow Up Visit  Eddie Hernandez [id number] 03-25-1956 65 y.o. 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     Interim History:  Eddie Hernandez is back for treatment.  He actually was in the ER recently.  He was there because of abdominal pain.  He had a temperature.  He had a bit of diarrhea.  So far, everything really did not show [place] a problem.  He did have a PET scan done recently.  PET scan showed a very nice response to treatment.  He had resolution of pulmonary nodules.  Resolution of hepatic disease.  The primary in the stomach also was decreased in size and activity.  I think are goal here is to try to get him to tolerate treatment.  Have to believe the oxaliplatin is what the problem is.  We have had no issues with nausea or vomiting right now.  He has had no bleeding.  He has had no pain.  He has had no problems with leg swelling.  Overall, I would say his performance status is ECOG 1.      Medications:  Current Outpatient Medications:    acetaminophen (TYLENOL) 500 MG tablet, Take 1,000 mg by mouth every 6 (six) hours as needed., Disp: , Rfl:    AMBULATORY NON FORMULARY MEDICATION, Medication Name: Glucometer and strips to test up to once a day. Dx diabetes, Disp: 1 Units, Rfl: 0   ARIPiprazole (ABILIFY) 5 MG tablet, TAKE ONE TABLET (5 MG TOTAL) BY MOUTH DAILY, Disp: 30 tablet, Rfl: 3   cilostazol (PLETAL) 100 MG tablet, Take 100 mg by mouth 2 (two) times daily., Disp: , Rfl:    dexamethasone (DECADRON) 4 MG tablet, Take 2 tablets (8 mg total) by mouth daily. Start the day after chemotherapy for 2 days. Take with food., Disp: 30 tablet, Rfl: 1   dicyclomine (BENTYL) 10 MG capsule, TAKE 1 CAPSULE (10 MG TOTAL) BY MOUTH 3 (THREE) TIMES DAILY BEFORE MEALS., Disp:  270 capsule, Rfl: 1   diphenoxylate-atropine (LOMOTIL) 2.5-0.025 MG tablet, TAKE 2 TABLETS BY MOUTH 4 (FOUR) TIMES DAILY AS NEEDED FOR DIARRHEA OR LOOSE STOOLS., Disp: 100 tablet, Rfl: 0   DULoxetine (CYMBALTA) 60 MG capsule, Take 60 mg by mouth daily., Disp: , Rfl:    eszopiclone (LUNESTA) 2 MG TABS tablet, Take 1 tablet (2 mg total) by mouth at bedtime as needed., Disp: 30 tablet, Rfl: 1   levothyroxine (SYNTHROID) 150 MCG tablet, Take 1 tablet (150 mcg total) by mouth daily before breakfast., Disp: 30 tablet, Rfl: 4   lidocaine-prilocaine (EMLA) cream, Apply to affected area once, Disp: 30 g, Rfl: 3   lisinopril (ZESTRIL) 20 MG tablet, Take 20 mg by mouth daily., Disp: , Rfl:    lubiprostone (AMITIZA) 8 MCG capsule, TAKE 1 CAPSULE (8 MCG TOTAL) BY MOUTH 2 (TWO) TIMES DAILY WITH A MEAL., Disp: 180 capsule, Rfl: 2   mirtazapine (REMERON SOL-TAB) 15 MG disintegrating tablet, Take 15 mg by mouth daily., Disp: , Rfl:    NUVIGIL 250 MG tablet, Take 250 mg by mouth daily., Disp: , Rfl:    ondansetron (ZOFRAN) 8 MG tablet, Take 1 tablet (8 mg total) by mouth 2 (two) times daily as needed for refractory nausea / vomiting. Start on day 3 after chemotherapy., Disp: 30 tablet, Rfl: 1  pantoprazole (PROTONIX) 40 MG tablet, Take 1 tablet by mouth daily., Disp: , Rfl:    pentoxifylline (TRENTAL) 400 MG CR tablet, Take by mouth., Disp: , Rfl:    potassium chloride SA (KLOR-CON) 20 MEQ tablet, Take 2 tablets (40 mEq total) by mouth 2 (two) times daily., Disp: 120 tablet, Rfl: 3   prednisoLONE acetate (PRED FORTE) 1 % ophthalmic suspension, 1 drop 2 (two) times daily., Disp: , Rfl:    pregabalin (LYRICA) 200 MG capsule, Take 1 capsule by mouth 3 (three) times daily., Disp: , Rfl:    prochlorperazine (COMPAZINE) 10 MG tablet, Take 1 tablet (10 mg total) by mouth every 6 (six) hours as needed (Nausea or vomiting)., Disp: 30 tablet, Rfl: 1   SODIUM FLUORIDE 5000 PPM 1.1 % PSTE, SMARTSIG:Sparingly Topical Every  Night, Disp: , Rfl:   Allergies: No Known Allergies  Past Medical History, Surgical history, Social history, and Family History were reviewed and updated.  Review of Systems: Review of Systems  Constitutional: Negative.   HENT:  Negative.    Eyes: Negative.   Respiratory: Negative.    Cardiovascular: Negative.   Gastrointestinal: Negative.   Endocrine: Negative.   Genitourinary: Negative.    Musculoskeletal: Negative.   Skin: Negative.   Neurological: Negative.   Hematological: Negative.   Psychiatric/Behavioral: Negative.     Physical Exam:  height is 6' (1.829 m) and weight is 199 lb 1.9 oz (90.3 kg). His oral temperature is 97.7 F (36.5 C). His blood pressure is 133/75 and his pulse is 87. His respiration is 17 and oxygen saturation is 100%.   Wt Readings from Last 3 Encounters:  01/28/21 199 lb 1.9 oz (90.3 kg)  01/22/21 202 lb (91.6 kg)  01/21/21 202 lb (91.6 kg)    Physical Exam Vitals reviewed.  HENT:     Head: Normocephalic and atraumatic.  Eyes:     Pupils: Pupils are equal, round, and reactive to light.  Cardiovascular:     Rate and Rhythm: Normal rate and regular rhythm.     Heart sounds: Normal heart sounds.  Pulmonary:     Effort: Pulmonary effort is normal.     Breath sounds: Normal breath sounds.  Abdominal:     General: Bowel sounds are normal.     Palpations: Abdomen is soft.  Musculoskeletal:        General: No tenderness or deformity. Normal range of motion.     Cervical back: Normal range of motion.  Lymphadenopathy:     Cervical: No cervical adenopathy.  Skin:    General: Skin is warm and dry.     Findings: No erythema or rash.  Neurological:     Mental Status: He is alert and oriented to person, place, and time.  Psychiatric:        Behavior: Behavior normal.        Thought Content: Thought content normal.        Judgment: Judgment normal.     Lab Results  Component Value Date   WBC 10.3 01/22/2021   HGB 11.2 (L) 01/22/2021    HCT 34.1 (L) 01/22/2021   MCV 94.2 01/22/2021   PLT 185 01/22/2021     Chemistry      Component Value Date/Time   NA 134 (L) 01/22/2021 1750   K 3.7 01/22/2021 1750   CL 105 01/22/2021 1750   CO2 21 (L) 01/22/2021 1750   BUN <5 (L) 01/22/2021 1750   CREATININE 0.99 01/22/2021 1750   CREATININE 0.91 01/05/2021  1308   CREATININE 0.96 09/05/2020 0000      Component Value Date/Time   CALCIUM 8.5 (L) 01/22/2021 1750   ALKPHOS 126 01/22/2021 1750   AST 37 01/22/2021 1750   AST 26 01/05/2021 0953   ALT 23 01/22/2021 1750   ALT 19 01/05/2021 0953   BILITOT 0.7 01/22/2021 1750   BILITOT 0.4 01/05/2021 0953      Impression and Plan: Mr. Schulenburg is a very nice 65 year old white male.  He has metastatic adenocarcinoma of the GE junction..  We will go ahead with his next cycle of treatment.  We will adjust the oxaliplatin dose.  I had believe this is the one that is causing problems.  I would not think the Herceptin or the nivolumab would be causing issues.  I am just glad that his quality of life is doing well right now.  I am even more happy that he is responding.  We will plan for an extra week off.  I have think that he does need the extra week off.  I will see him back in 3 weeks.    [name], MD 8/31/20228:19 AM

## 2021-01-28 NOTE — Patient Instructions (Addendum)
[facility]  Discharge Instructions: Thank you for choosing [facility] to provide your oncology and hematology care.   If you have a lab appointment with the [facility], please go directly to the [facility] and check in at the registration area.  Wear comfortable clothing and clothing appropriate for easy access to any Portacath or PICC line.   We strive to give you quality time with your provider. You may need to reschedule your appointment if you arrive late (15 or more minutes).  Arriving late affects you and other patients whose appointments are after yours.  Also, if you miss three or more appointments without notifying the office, you may be dismissed from the clinic at the provider's discretion.      For prescription refill requests, have your pharmacy contact our office and allow 72 hours for refills to be completed.    Today you received the following chemotherapy and/or immunotherapy agents 5FU, Oxaliplatin, Ogivri,  and Leucovorin   To help prevent nausea and vomiting after your treatment, we encourage you to take your nausea medication as directed.  BELOW ARE SYMPTOMS THAT SHOULD BE REPORTED IMMEDIATELY: *FEVER GREATER THAN 100.4 F (38 C) OR HIGHER *CHILLS OR SWEATING *NAUSEA AND VOMITING THAT IS NOT CONTROLLED WITH YOUR NAUSEA MEDICATION *UNUSUAL SHORTNESS OF BREATH *UNUSUAL BRUISING OR BLEEDING *URINARY PROBLEMS (pain or burning when urinating, or frequent urination) *BOWEL PROBLEMS (unusual diarrhea, constipation, pain near the anus) TENDERNESS IN MOUTH AND THROAT WITH OR WITHOUT PRESENCE OF ULCERS (sore throat, sores in mouth, or a toothache) UNUSUAL RASH, SWELLING OR PAIN  UNUSUAL VAGINAL DISCHARGE OR ITCHING   Items with * indicate a potential emergency and should be followed up as soon as possible or go to the Emergency Department if any problems should occur.  Please show the CHEMOTHERAPY ALERT CARD or IMMUNOTHERAPY  ALERT CARD at check-in to the Emergency Department and triage nurse. Should you have questions after your visit or need to cancel or reschedule your appointment, please contact [facility]  [phone] and follow the prompts.  Office hours are 8:00 a.m. to 4:30 p.m. Monday - Friday. Please note that voicemails left after 4:00 p.m. may not be returned until the following business day.  We are closed weekends and major holidays. You have access to a nurse at all times for urgent questions. Please call the main number to the clinic [phone] and follow the prompts.  For any non-urgent questions, you may also contact your provider using MyChart. We now offer e-Visits for anyone 18 and older to request care online for non-urgent symptoms. For details visit [email or web address].   Also download the MyChart app! Go to the app store, search "MyChart", open the app, select [facility], and log in with your MyChart username and password.  Due to Covid, a mask is required upon entering the hospital/clinic. If you do not have a mask, one will be given to you upon arrival. For doctor visits, patients may have 1 support person aged 7 or older with them. For treatment visits, patients cannot have anyone with them due to current Covid guidelines and our immunocompromised population.

## 2021-01-28 NOTE — Patient Instructions (Signed)
Implanted Port Home Guide An implanted port is a device that is placed under the skin. It is usually placed in the chest. The device can be used to give IV medicine, to take blood, or for dialysis. You may have an implanted port if: You need IV medicine that would be irritating to the small veins in your hands or arms. You need IV medicines, such as antibiotics, for a long period of time. You need IV nutrition for a long period of time. You need dialysis. When you have a port, your health care provider can choose to use the port instead of veins in your arms for these procedures. You may have fewer limitations when using a port than you would if you used other types of long-term IVs, and you will likely be able to return to normal activities after your incision heals. An implanted port has two main parts: Reservoir. The reservoir is the part where a needle is inserted to give medicines or draw blood. The reservoir is round. After it is placed, it appears as a small, raised area under your skin. Catheter. The catheter is a thin, flexible tube that connects the reservoir to a vein. Medicine that is inserted into the reservoir goes into the catheter and then into the vein. How is my port accessed? To access your port: A numbing cream may be placed on the skin over the port site. Your health care provider will put on a mask and sterile gloves. The skin over your port will be cleaned carefully with a germ-killing soap and allowed to dry. Your health care provider will gently pinch the port and insert a needle into it. Your health care provider will check for a blood return to make sure the port is in the vein and is not clogged. If your port needs to remain accessed to get medicine continuously (constant infusion), your health care provider will place a clear bandage (dressing) over the needle site. The dressing and needle will need to be changed every week, or as told by your health care provider. What  is flushing? Flushing helps keep the port from getting clogged. Follow instructions from your health care provider about how and when to flush the port. Ports are usually flushed with saline solution or a medicine called heparin. The need for flushing will depend on how the port is used: If the port is only used from time to time to give medicines or draw blood, the port may need to be flushed: Before and after medicines have been given. Before and after blood has been drawn. As part of routine maintenance. Flushing may be recommended every 4-6 weeks. If a constant infusion is running, the port may not need to be flushed. Throw away any syringes in a disposal container that is meant for sharp items (sharps container). You can buy a sharps container from a pharmacy, or you can make one by using an empty hard plastic bottle with a cover. How long will my port stay implanted? The port can stay in for as long as your health care provider thinks it is needed. When it is time for the port to come out, a surgery will be done to remove it. The surgery will be similar to the procedure that was done to put the port in. Follow these instructions at home:  Flush your port as told by your health care provider. If you need an infusion over several days, follow instructions from your health care provider about how   to take care of your port site. Make sure you: Wash your hands with soap and water before you change your dressing. If soap and water are not available, use alcohol-based hand sanitizer. Change your dressing as told by your health care provider. Place any used dressings or infusion bags into a plastic bag. Throw that bag in the trash. Keep the dressing that covers the needle clean and dry. Do not get it wet. Do not use scissors or sharp objects near the tube. Keep the tube clamped, unless it is being used. Check your port site every day for signs of infection. Check for: Redness, swelling, or  pain. Fluid or blood. Pus or a bad smell. Protect the skin around the port site. Avoid wearing bra straps that rub or irritate the site. Protect the skin around your port from seat belts. Place a soft pad over your chest if needed. Bathe or shower as told by your health care provider. The site may get wet as long as you are not actively receiving an infusion. Return to your normal activities as told by your health care provider. Ask your health care provider what activities are safe for you. Carry a medical alert card or wear a medical alert bracelet at all times. This will let health care providers know that you have an implanted port in case of an emergency. Get help right away if: You have redness, swelling, or pain at the port site. You have fluid or blood coming from your port site. You have pus or a bad smell coming from the port site. You have a fever. Summary Implanted ports are usually placed in the chest for long-term IV access. Follow instructions from your health care provider about flushing the port and changing bandages (dressings). Take care of the area around your port by avoiding clothing that puts pressure on the area, and by watching for signs of infection. Protect the skin around your port from seat belts. Place a soft pad over your chest if needed. Get help right away if you have a fever or you have redness, swelling, pain, drainage, or a bad smell at the port site. This information is not intended to replace advice given to you by your health care provider. Make sure you discuss any questions you have with your health care provider. Document Revised: 08/06/2020 Document Reviewed: 10/01/2019 Elsevier Patient Education  2022 [employer]  

## 2021-01-28 NOTE — Progress Notes (Signed)
Visited with patient in the treatment room prior to his MD appointment. He is feeling well today and ready for his next cycle.   Oncology Nurse Navigator Documentation  Oncology Nurse Navigator Flowsheets 01/28/2021  Abnormal Finding Date -  Confirmed Diagnosis Date -  Diagnosis Status -  Phase of Treatment -  Chemotherapy Actual Start Date: -  Chemotherapy Expected End Date: -  Navigator Follow Up Date: 02/18/2021  Navigator Follow Up Reason: Follow-up Appointment;Chemotherapy  Navigator Location [facility]  Navigator Encounter Type Follow-up Appt;Appt/Treatment Plan Review  Telephone -  Treatment Initiated Date -  Patient Visit Type MedOnc  Treatment Phase Active Tx  Barriers/Navigation Needs Coordination of Care;Education  Education -  Interventions Psycho-Social Support  Acuity Level 2-Minimal Needs (1-2 Barriers Identified)  Coordination of Care -  Education Method -  Support Groups/Services Friends and Family  Time Spent with Patient 15

## 2021-01-29 ENCOUNTER — Encounter: Payer: Self-pay | Admitting: Hematology & Oncology

## 2021-01-29 ENCOUNTER — Telehealth: Payer: Self-pay

## 2021-01-29 LAB — T4: T4, Total: 13.3 ug/dL — ABNORMAL HIGH (ref 4.5–12.0)

## 2021-01-29 NOTE — Telephone Encounter (Signed)
Appts made per 01/28/21 los and pt to gain updated sch at 01/30/21 appt and through [facility]

## 2021-01-30 ENCOUNTER — Other Ambulatory Visit: Payer: Self-pay | Admitting: *Deleted

## 2021-01-30 ENCOUNTER — Encounter: Payer: Self-pay | Admitting: *Deleted

## 2021-01-30 ENCOUNTER — Inpatient Hospital Stay: Payer: BC Managed Care – PPO | Attending: Hematology & Oncology

## 2021-01-30 ENCOUNTER — Ambulatory Visit: Payer: BC Managed Care – PPO

## 2021-01-30 ENCOUNTER — Inpatient Hospital Stay: Payer: BC Managed Care – PPO

## 2021-01-30 ENCOUNTER — Other Ambulatory Visit: Payer: Self-pay

## 2021-01-30 VITALS — BP 139/87 | HR 78 | Temp 98.0°F | Resp 16

## 2021-01-30 DIAGNOSIS — Z79899 Other long term (current) drug therapy: Secondary | ICD-10-CM | POA: Diagnosis not present

## 2021-01-30 DIAGNOSIS — Z5111 Encounter for antineoplastic chemotherapy: Secondary | ICD-10-CM | POA: Diagnosis present

## 2021-01-30 DIAGNOSIS — T451X5A Adverse effect of antineoplastic and immunosuppressive drugs, initial encounter: Secondary | ICD-10-CM | POA: Insufficient documentation

## 2021-01-30 DIAGNOSIS — Z5112 Encounter for antineoplastic immunotherapy: Secondary | ICD-10-CM | POA: Diagnosis present

## 2021-01-30 DIAGNOSIS — D701 Agranulocytosis secondary to cancer chemotherapy: Secondary | ICD-10-CM | POA: Insufficient documentation

## 2021-01-30 DIAGNOSIS — Z5189 Encounter for other specified aftercare: Secondary | ICD-10-CM | POA: Insufficient documentation

## 2021-01-30 DIAGNOSIS — E118 Type 2 diabetes mellitus with unspecified complications: Secondary | ICD-10-CM

## 2021-01-30 DIAGNOSIS — C160 Malignant neoplasm of cardia: Secondary | ICD-10-CM | POA: Insufficient documentation

## 2021-01-30 MED ORDER — BLOOD GLUCOSE METER KIT: PACK | 0 refills | Status: AC

## 2021-01-30 MED ORDER — SODIUM CHLORIDE 0.9% FLUSH
10.0000 mL | INTRAVENOUS | Status: DC | PRN
  Administered 2021-01-30: 10 mL

## 2021-01-30 MED ORDER — PEGFILGRASTIM-JMDB 6 MG/0.6ML ~~LOC~~ SOSY
6.0000 mg | PREFILLED_SYRINGE | Freq: Once | SUBCUTANEOUS | Status: AC
  Administered 2021-01-30: 6 mg via SUBCUTANEOUS
  Filled 2021-01-30: qty 0.6

## 2021-01-30 MED ORDER — HEPARIN SOD (PORK) LOCK FLUSH 100 UNIT/ML IV SOLN
500.0000 [IU] | Freq: Once | INTRAVENOUS | Status: AC | PRN
  Administered 2021-01-30: 500 [IU]

## 2021-01-30 NOTE — Patient Instructions (Signed)
Pegfilgrastim injection What is this medication? PEGFILGRASTIM (PEG fil gra stim) is a long-acting granulocyte colony-stimulating factor that stimulates the growth of neutrophils, a type of white blood cell important in the body's fight against infection. It is used to reduce the incidence of fever and infection in patients with certain types of cancer who are receiving chemotherapy that affects the bone marrow, and to increase survival after being exposed to high doses of radiation. This medicine may be used for other purposes; ask your health care provider or pharmacist if you have questions. COMMON BRAND NAME(S): Rexene Edison, Ziextenzo What should I tell my care team before I take this medication? They need to know if you have any of these conditions: kidney disease latex allergy ongoing radiation therapy sickle cell disease skin reactions to acrylic adhesives (On-Body Injector only) an unusual or allergic reaction to pegfilgrastim, filgrastim, other medicines, foods, dyes, or preservatives pregnant or trying to get pregnant breast-feeding How should I use this medication? This medicine is for injection under the skin. If you get this medicine at home, you will be taught how to prepare and give the pre-filled syringe or how to use the On-body Injector. Refer to the patient Instructions for Use for detailed instructions. Use exactly as directed. Tell your healthcare provider immediately if you suspect that the On-body Injector may not have performed as intended or if you suspect the use of the On-body Injector resulted in a missed or partial dose. It is important that you put your used needles and syringes in a special sharps container. Do not put them in a trash can. If you do not have a sharps container, call your pharmacist or healthcare provider to get one. Talk to your pediatrician regarding the use of this medicine in children. While this drug may be prescribed for  selected conditions, precautions do apply. Overdosage: If you think you have taken too much of this medicine contact a poison control center or emergency room at once. NOTE: This medicine is only for you. Do not share this medicine with others. What if I miss a dose? It is important not to miss your dose. Call your doctor or health care professional if you miss your dose. If you miss a dose due to an On-body Injector failure or leakage, a new dose should be administered as soon as possible using a single prefilled syringe for manual use. What may interact with this medication? Interactions have not been studied. This list may not describe all possible interactions. Give your health care provider a list of all the medicines, herbs, non-prescription drugs, or dietary supplements you use. Also tell them if you smoke, drink alcohol, or use illegal drugs. Some items may interact with your medicine. What should I watch for while using this medication? Your condition will be monitored carefully while you are receiving this medicine. You may need blood work done while you are taking this medicine. Talk to your health care provider about your risk of cancer. You may be more at risk for certain types of cancer if you take this medicine. If you are going to need a MRI, CT scan, or other procedure, tell your doctor that you are using this medicine (On-Body Injector only). What side effects may I notice from receiving this medication? Side effects that you should report to your doctor or health care professional as soon as possible: allergic reactions (skin rash, itching or hives, swelling of the face, lips, or tongue) back pain dizziness fever pain,  redness, or irritation at site where injected pinpoint red spots on the skin red or dark-brown urine shortness of breath or breathing problems stomach or side pain, or pain at the shoulder swelling tiredness trouble passing urine or change in the amount of  urine unusual bruising or bleeding Side effects that usually do not require medical attention (report to your doctor or health care professional if they continue or are bothersome): bone pain muscle pain This list may not describe all possible side effects. Call your doctor for medical advice about side effects. You may report side effects to FDA at 1-800-FDA-1088. Where should I keep my medication? Keep out of the reach of children. If you are using this medicine at home, you will be instructed on how to store it. Throw away any unused medicine after the expiration date on the label. NOTE: This sheet is a summary. It may not cover all possible information. If you have questions about this medicine, talk to your doctor, pharmacist, or health care provider.  2022 Elsevier/Gold Standard (2020-06-13 11:54:14) Fluorouracil, 5-FU injection What is this medication? FLUOROURACIL, 5-FU (flure oh YOOR a sil) is a chemotherapy drug. It slows the growth of cancer cells. This medicine is used to treat many types of cancer like breast cancer, colon or rectal cancer, pancreatic cancer, and stomach cancer. This medicine may be used for other purposes; ask your health care provider or pharmacist if you have questions. COMMON BRAND NAME(S): Adrucil What should I tell my care team before I take this medication? They need to know if you have any of these conditions: blood disorders dihydropyrimidine dehydrogenase (DPD) deficiency infection (especially a virus infection such as chickenpox, cold sores, or herpes) kidney disease liver disease malnourished, poor nutrition recent or ongoing radiation therapy an unusual or allergic reaction to fluorouracil, other chemotherapy, other medicines, foods, dyes, or preservatives pregnant or trying to get pregnant breast-feeding How should I use this medication? This drug is given as an infusion or injection into a vein. It is administered in a hospital or clinic by a  specially trained health care professional. Talk to your pediatrician regarding the use of this medicine in children. Special care may be needed. Overdosage: If you think you have taken too much of this medicine contact a poison control center or emergency room at once. NOTE: This medicine is only for you. Do not share this medicine with others. What if I miss a dose? It is important not to miss your dose. Call your doctor or health care professional if you are unable to keep an appointment. What may interact with this medication? Do not take this medicine with any of the following medications: live virus vaccines This medicine may also interact with the following medications: medicines that treat or prevent blood clots like warfarin, enoxaparin, and dalteparin This list may not describe all possible interactions. Give your health care provider a list of all the medicines, herbs, non-prescription drugs, or dietary supplements you use. Also tell them if you smoke, drink alcohol, or use illegal drugs. Some items may interact with your medicine. What should I watch for while using this medication? Visit your doctor for checks on your progress. This drug may make you feel generally unwell. This is not uncommon, as chemotherapy can affect healthy cells as well as cancer cells. Report any side effects. Continue your course of treatment even though you feel ill unless your doctor tells you to stop. In some cases, you may be given additional medicines to help  with side effects. Follow all directions for their use. Call your doctor or health care professional for advice if you get a fever, chills or sore throat, or other symptoms of a cold or flu. Do not treat yourself. This drug decreases your body's ability to fight infections. Try to avoid being around people who are sick. This medicine may increase your risk to bruise or bleed. Call your doctor or health care professional if you notice any unusual  bleeding. Be careful brushing and flossing your teeth or using a toothpick because you may get an infection or bleed more easily. If you have any dental work done, tell your dentist you are receiving this medicine. Avoid taking products that contain aspirin, acetaminophen, ibuprofen, naproxen, or ketoprofen unless instructed by your doctor. These medicines may hide a fever. Do not become pregnant while taking this medicine. Women should inform their doctor if they wish to become pregnant or think they might be pregnant. There is a potential for serious side effects to an unborn child. Talk to your health care professional or pharmacist for more information. Do not breast-feed an infant while taking this medicine. Men should inform their doctor if they wish to father a child. This medicine may lower sperm counts. Do not treat diarrhea with over the counter products. Contact your doctor if you have diarrhea that lasts more than 2 days or if it is severe and watery. This medicine can make you more sensitive to the sun. Keep out of the sun. If you cannot avoid being in the sun, wear protective clothing and use sunscreen. Do not use sun lamps or tanning beds/booths. What side effects may I notice from receiving this medication? Side effects that you should report to your doctor or health care professional as soon as possible: allergic reactions like skin rash, itching or hives, swelling of the face, lips, or tongue low blood counts - this medicine may decrease the number of white blood cells, red blood cells and platelets. You may be at increased risk for infections and bleeding. signs of infection - fever or chills, cough, sore throat, pain or difficulty passing urine signs of decreased platelets or bleeding - bruising, pinpoint red spots on the skin, black, tarry stools, blood in the urine signs of decreased red blood cells - unusually weak or tired, fainting spells, lightheadedness breathing  problems changes in vision chest pain mouth sores nausea and vomiting pain, swelling, redness at site where injected pain, tingling, numbness in the hands or feet redness, swelling, or sores on hands or feet stomach pain unusual bleeding Side effects that usually do not require medical attention (report to your doctor or health care professional if they continue or are bothersome): changes in finger or toe nails diarrhea dry or itchy skin hair loss headache loss of appetite sensitivity of eyes to the light stomach upset unusually teary eyes This list may not describe all possible side effects. Call your doctor for medical advice about side effects. You may report side effects to FDA at 1-800-FDA-1088. Where should I keep my medication? This drug is given in a hospital or clinic and will not be stored at home. NOTE: This sheet is a summary. It may not cover all possible information. If you have questions about this medicine, talk to your doctor, pharmacist, or health care provider.  2022 Elsevier/Gold Standard (2019-04-17 15:00:03)

## 2021-02-12 ENCOUNTER — Inpatient Hospital Stay: Payer: BC Managed Care – PPO

## 2021-02-12 ENCOUNTER — Telehealth: Payer: Self-pay | Admitting: *Deleted

## 2021-02-12 ENCOUNTER — Other Ambulatory Visit: Payer: Self-pay | Admitting: *Deleted

## 2021-02-12 ENCOUNTER — Other Ambulatory Visit: Payer: Self-pay

## 2021-02-12 VITALS — BP 147/55 | HR 62 | Temp 97.9°F | Resp 19

## 2021-02-12 DIAGNOSIS — R110 Nausea: Secondary | ICD-10-CM

## 2021-02-12 DIAGNOSIS — C160 Malignant neoplasm of cardia: Secondary | ICD-10-CM

## 2021-02-12 DIAGNOSIS — D500 Iron deficiency anemia secondary to blood loss (chronic): Secondary | ICD-10-CM

## 2021-02-12 DIAGNOSIS — C169 Malignant neoplasm of stomach, unspecified: Secondary | ICD-10-CM

## 2021-02-12 DIAGNOSIS — K580 Irritable bowel syndrome with diarrhea: Secondary | ICD-10-CM

## 2021-02-12 LAB — CBC WITH DIFFERENTIAL (CANCER CENTER ONLY)
Abs Immature Granulocytes: 0.05 10*3/uL (ref 0.00–0.07)
Basophils Absolute: 0 10*3/uL (ref 0.0–0.1)
Basophils Relative: 1 %
Eosinophils Absolute: 0.3 10*3/uL (ref 0.0–0.5)
Eosinophils Relative: 4 %
HCT: 30.5 % — ABNORMAL LOW (ref 39.0–52.0)
Hemoglobin: 10.3 g/dL — ABNORMAL LOW (ref 13.0–17.0)
Immature Granulocytes: 1 %
Lymphocytes Relative: 12 %
Lymphs Abs: 0.9 10*3/uL (ref 0.7–4.0)
MCH: 31.5 pg (ref 26.0–34.0)
MCHC: 33.8 g/dL (ref 30.0–36.0)
MCV: 93.3 fL (ref 80.0–100.0)
Monocytes Absolute: 1.4 10*3/uL — ABNORMAL HIGH (ref 0.1–1.0)
Monocytes Relative: 18 %
Neutro Abs: 5 10*3/uL (ref 1.7–7.7)
Neutrophils Relative %: 64 %
Platelet Count: 135 10*3/uL — ABNORMAL LOW (ref 150–400)
RBC: 3.27 MIL/uL — ABNORMAL LOW (ref 4.22–5.81)
RDW: 17.2 % — ABNORMAL HIGH (ref 11.5–15.5)
WBC Count: 7.7 10*3/uL (ref 4.0–10.5)
nRBC: 0 % (ref 0.0–0.2)

## 2021-02-12 LAB — CMP (CANCER CENTER ONLY)
ALT: 20 U/L (ref 0–44)
AST: 27 U/L (ref 15–41)
Albumin: 4 g/dL (ref 3.5–5.0)
Alkaline Phosphatase: 151 U/L — ABNORMAL HIGH (ref 38–126)
Anion gap: 8 (ref 5–15)
BUN: 8 mg/dL (ref 8–23)
CO2: 24 mmol/L (ref 22–32)
Calcium: 8.9 mg/dL (ref 8.9–10.3)
Chloride: 101 mmol/L (ref 98–111)
Creatinine: 0.9 mg/dL (ref 0.61–1.24)
GFR, Estimated: >60 mL/min (ref >60)
Glucose, Bld: 98 mg/dL (ref 70–99)
Potassium: 3.7 mmol/L (ref 3.5–5.1)
Sodium: 133 mmol/L — ABNORMAL LOW (ref 135–145)
Total Bilirubin: 0.5 mg/dL (ref 0.3–1.2)
Total Protein: 6.1 g/dL — ABNORMAL LOW (ref 6.5–8.1)

## 2021-02-12 MED ORDER — HEPARIN SOD (PORK) LOCK FLUSH 100 UNIT/ML IV SOLN
500.0000 [IU] | Freq: Once | INTRAVENOUS | Status: AC
  Administered 2021-02-12: 500 [IU] via INTRAVENOUS

## 2021-02-12 MED ORDER — LORAZEPAM 1 MG PO TABS
1.0000 mg | ORAL_TABLET | Freq: Once | ORAL | Status: AC
  Administered 2021-02-12: 1 mg via ORAL
  Filled 2021-02-12: qty 1

## 2021-02-12 MED ORDER — SODIUM CHLORIDE 0.9 % IV SOLN: Freq: Once | INTRAVENOUS | Status: DC

## 2021-02-12 MED ORDER — LORAZEPAM 2 MG/ML IJ SOLN: 1.0000 mg | Freq: Once | INTRAMUSCULAR | Status: DC

## 2021-02-12 MED ORDER — SODIUM CHLORIDE 0.9% FLUSH
10.0000 mL | Freq: Once | INTRAVENOUS | Status: AC
  Administered 2021-02-12: 10 mL via INTRAVENOUS

## 2021-02-12 MED ORDER — SODIUM CHLORIDE 0.9 % IV SOLN
40.0000 mg | Freq: Once | INTRAVENOUS | Status: AC
  Administered 2021-02-12: 40 mg via INTRAVENOUS
  Filled 2021-02-12: qty 4

## 2021-02-12 MED ORDER — BACLOFEN 10 MG PO TABS: 5.0000 mg | ORAL_TABLET | Freq: Three times a day (TID) | ORAL | 0 refills | Status: DC

## 2021-02-12 MED ORDER — DEXAMETHASONE SODIUM PHOSPHATE 10 MG/ML IJ SOLN
10.0000 mg | Freq: Once | INTRAMUSCULAR | Status: DC
  Filled 2021-02-12: qty 1

## 2021-02-12 MED ORDER — SODIUM CHLORIDE 0.9 % IV SOLN: Freq: Once | INTRAVENOUS | Status: AC

## 2021-02-12 MED ORDER — DEXAMETHASONE SODIUM PHOSPHATE 10 MG/ML IJ SOLN: 10.0000 mg | Freq: Once | INTRAMUSCULAR | Status: AC

## 2021-02-12 MED ORDER — SODIUM CHLORIDE 0.9 % IV SOLN
10.0000 mg | Freq: Once | INTRAVENOUS | Status: AC
  Administered 2021-02-12: 10 mg via INTRAVENOUS
  Filled 2021-02-12: qty 10

## 2021-02-12 NOTE — Patient Instructions (Signed)

## 2021-02-12 NOTE — Progress Notes (Addendum)
Patient c/o hiccups while in infusion, receiving IV fluids, states he has been having them at home also. Per Dr. Marin Olp, baclofen called to patient' pharmacy. Verbal instructions on usage given to patient. Patient verbalized understanding.

## 2021-02-12 NOTE — Telephone Encounter (Signed)
Per secure chat Eddie Hernandez 02/12/21  - lab - port flush and IVF - patient confirmed

## 2021-02-12 NOTE — Telephone Encounter (Signed)
Call received from patient stating that he remains nauseated despite taking Zofran and Compazine as ordered.  He states that he is not vomiting and is not able to eat, only drink water which he drinks approx 96 ozs a day.  He denies any dizziness or lightheadedness, but does c/o dry mouth and decrease in urination.  Dr. Marin Olp notified.  Pt instructed to come in today for lab work and IVF's per order of Dr. Marin Olp. Pt states that he can be here by 1:00PM today. Message sent to scheduling.

## 2021-02-15 ENCOUNTER — Other Ambulatory Visit: Payer: Self-pay | Admitting: Nurse Practitioner

## 2021-02-15 DIAGNOSIS — K219 Gastro-esophageal reflux disease without esophagitis: Secondary | ICD-10-CM

## 2021-02-16 ENCOUNTER — Encounter: Payer: Self-pay | Admitting: *Deleted

## 2021-02-17 ENCOUNTER — Other Ambulatory Visit: Payer: Self-pay | Admitting: Family Medicine

## 2021-02-18 ENCOUNTER — Inpatient Hospital Stay (HOSPITAL_BASED_OUTPATIENT_CLINIC_OR_DEPARTMENT_OTHER): Payer: BC Managed Care – PPO | Admitting: Hematology & Oncology

## 2021-02-18 ENCOUNTER — Inpatient Hospital Stay: Payer: BC Managed Care – PPO

## 2021-02-18 ENCOUNTER — Other Ambulatory Visit: Payer: Self-pay

## 2021-02-18 ENCOUNTER — Encounter: Payer: Self-pay | Admitting: *Deleted

## 2021-02-18 ENCOUNTER — Other Ambulatory Visit: Payer: Self-pay | Admitting: Family Medicine

## 2021-02-18 ENCOUNTER — Encounter: Payer: Self-pay | Admitting: Hematology & Oncology

## 2021-02-18 VITALS — BP 132/66 | HR 99 | Temp 97.6°F | Resp 18 | Ht 72.0 in | Wt 201.0 lb

## 2021-02-18 DIAGNOSIS — C160 Malignant neoplasm of cardia: Secondary | ICD-10-CM

## 2021-02-18 DIAGNOSIS — D500 Iron deficiency anemia secondary to blood loss (chronic): Secondary | ICD-10-CM | POA: Diagnosis not present

## 2021-02-18 DIAGNOSIS — E038 Other specified hypothyroidism: Secondary | ICD-10-CM | POA: Diagnosis not present

## 2021-02-18 LAB — CBC WITH DIFFERENTIAL (CANCER CENTER ONLY)
Abs Immature Granulocytes: 0.04 10*3/uL (ref 0.00–0.07)
Basophils Absolute: 0 10*3/uL (ref 0.0–0.1)
Basophils Relative: 0 %
Eosinophils Absolute: 0.4 10*3/uL (ref 0.0–0.5)
Eosinophils Relative: 6 %
HCT: 28 % — ABNORMAL LOW (ref 39.0–52.0)
Hemoglobin: 9.5 g/dL — ABNORMAL LOW (ref 13.0–17.0)
Immature Granulocytes: 1 %
Lymphocytes Relative: 12 %
Lymphs Abs: 0.9 10*3/uL (ref 0.7–4.0)
MCH: 32 pg (ref 26.0–34.0)
MCHC: 33.9 g/dL (ref 30.0–36.0)
MCV: 94.3 fL (ref 80.0–100.0)
Monocytes Absolute: 0.8 10*3/uL (ref 0.1–1.0)
Monocytes Relative: 11 %
Neutro Abs: 5 10*3/uL (ref 1.7–7.7)
Neutrophils Relative %: 70 %
Platelet Count: 188 10*3/uL (ref 150–400)
RBC: 2.97 MIL/uL — ABNORMAL LOW (ref 4.22–5.81)
RDW: 17.2 % — ABNORMAL HIGH (ref 11.5–15.5)
WBC Count: 7.1 10*3/uL (ref 4.0–10.5)
nRBC: 0 % (ref 0.0–0.2)

## 2021-02-18 LAB — CMP (CANCER CENTER ONLY)
ALT: 14 U/L (ref 0–44)
AST: 21 U/L (ref 15–41)
Albumin: 3.6 g/dL (ref 3.5–5.0)
Alkaline Phosphatase: 103 U/L (ref 38–126)
Anion gap: 7 (ref 5–15)
BUN: 11 mg/dL (ref 8–23)
CO2: 25 mmol/L (ref 22–32)
Calcium: 8.7 mg/dL — ABNORMAL LOW (ref 8.9–10.3)
Chloride: 107 mmol/L (ref 98–111)
Creatinine: 0.92 mg/dL (ref 0.61–1.24)
GFR, Estimated: >60 mL/min (ref >60)
Glucose, Bld: 101 mg/dL — ABNORMAL HIGH (ref 70–99)
Potassium: 3.2 mmol/L — ABNORMAL LOW (ref 3.5–5.1)
Sodium: 139 mmol/L (ref 135–145)
Total Bilirubin: 0.5 mg/dL (ref 0.3–1.2)
Total Protein: 5.8 g/dL — ABNORMAL LOW (ref 6.5–8.1)

## 2021-02-18 LAB — LACTATE DEHYDROGENASE: LDH: 187 U/L (ref 98–192)

## 2021-02-18 MED ORDER — DEXTROSE 5 % IV SOLN: Freq: Once | INTRAVENOUS | Status: AC

## 2021-02-18 MED ORDER — SODIUM CHLORIDE 0.9 % IV SOLN
10.0000 mg | Freq: Once | INTRAVENOUS | Status: AC
  Administered 2021-02-18: 10 mg via INTRAVENOUS
  Filled 2021-02-18: qty 10

## 2021-02-18 MED ORDER — SODIUM CHLORIDE 0.9 % IV SOLN
480.0000 mg | Freq: Once | INTRAVENOUS | Status: AC
  Administered 2021-02-18: 480 mg via INTRAVENOUS
  Filled 2021-02-18: qty 48

## 2021-02-18 MED ORDER — FLUOROURACIL CHEMO INJECTION 5 GM/100ML
1920.0000 mg/m2 | INTRAVENOUS | Status: DC
  Administered 2021-02-18: 4150 mg via INTRAVENOUS
  Filled 2021-02-18: qty 83

## 2021-02-18 MED ORDER — ACETAMINOPHEN 325 MG PO TABS
650.0000 mg | ORAL_TABLET | Freq: Once | ORAL | Status: AC
  Administered 2021-02-18: 650 mg via ORAL
  Filled 2021-02-18: qty 2

## 2021-02-18 MED ORDER — FLUOROURACIL CHEMO INJECTION 2.5 GM/50ML
320.0000 mg/m2 | Freq: Once | INTRAVENOUS | Status: AC
  Administered 2021-02-18: 700 mg via INTRAVENOUS
  Filled 2021-02-18: qty 14

## 2021-02-18 MED ORDER — SODIUM CHLORIDE 0.9 % IV SOLN: INTRAVENOUS | Status: DC

## 2021-02-18 MED ORDER — PALONOSETRON HCL INJECTION 0.25 MG/5ML
0.2500 mg | Freq: Once | INTRAVENOUS | Status: AC
  Administered 2021-02-18: 0.25 mg via INTRAVENOUS
  Filled 2021-02-18: qty 5

## 2021-02-18 MED ORDER — OXALIPLATIN CHEMO INJECTION 100 MG/20ML
69.0000 mg/m2 | Freq: Once | INTRAVENOUS | Status: AC
  Administered 2021-02-18: 150 mg via INTRAVENOUS
  Filled 2021-02-18: qty 20

## 2021-02-18 MED ORDER — DIPHENHYDRAMINE HCL 25 MG PO CAPS
50.0000 mg | ORAL_CAPSULE | Freq: Once | ORAL | Status: AC
  Administered 2021-02-18: 50 mg via ORAL
  Filled 2021-02-18: qty 2

## 2021-02-18 MED ORDER — TRASTUZUMAB-DKST CHEMO 150 MG IV SOLR
4.0000 mg/kg | Freq: Once | INTRAVENOUS | Status: AC
  Administered 2021-02-18: 357 mg via INTRAVENOUS
  Filled 2021-02-18: qty 17

## 2021-02-18 MED ORDER — LEUCOVORIN CALCIUM INJECTION 350 MG
400.0000 mg/m2 | Freq: Once | INTRAVENOUS | Status: AC
  Administered 2021-02-18: 864 mg via INTRAVENOUS
  Filled 2021-02-18: qty 43.2

## 2021-02-18 NOTE — Progress Notes (Signed)
Oncology Nurse Navigator Documentation  Oncology Nurse Navigator Flowsheets 02/18/2021  Abnormal Finding Date -  Confirmed Diagnosis Date -  Diagnosis Status -  Phase of Treatment -  Chemotherapy Actual Start Date: -  Chemotherapy Expected End Date: -  Navigator Follow Up Date: 03/03/2021  Navigator Follow Up Reason: Follow-up Appointment;Chemotherapy  Navigator Location [facility]  Navigator Encounter Type Appt/Treatment Plan Review;Treatment  Telephone -  Treatment Initiated Date -  Patient Visit Type MedOnc  Treatment Phase Active Tx  Barriers/Navigation Needs Coordination of Care;Education  Education -  Interventions Psycho-Social Support  Acuity Level 2-Minimal Needs (1-2 Barriers Identified)  Coordination of Care -  Education Method -  Support Groups/Services Friends and Family  Time Spent with Patient 15

## 2021-02-18 NOTE — Progress Notes (Signed)
Hematology and Oncology Follow Up Visit  Eddie Hernandez [id number] 23-Jul-1955 65 y.o. 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     Interim History:  Mr. Mozer is back for treatment.  He is looking better.  He really has had a tough time with treatment.  I am not sure is as to why he has had such a difficult time.  We will try to make some dosage adjustments for him.  Thankfully, his cancer has responded nicely to treatment given his last PET scan.  He is adopted a new dog.  I am so excited about this.  I saw a picture of Jarrett Soho.  She is very cute.  I know that she will have a very good home with a lot of love.  He is eating a little bit better now.  He has had some issues with diarrhea.  We does seem to help him his IV fluids.  We will have him come in weekly for IV fluids to try to help minimize the issues that he has had.  He has had no bleeding.  He has had no fever.  He has had no cough or shortness of breath.  There is been no leg swelling.  He has had no rashes.  He is urinating okay.  Overall, I would say his performance status is probably ECOG 1.   Medications:  Current Outpatient Medications:    acetaminophen (TYLENOL) 500 MG tablet, Take 1,000 mg by mouth every 6 (six) hours as needed., Disp: , Rfl:    AMBULATORY NON FORMULARY MEDICATION, Medication Name: Glucometer and strips to test up to once a day. Dx diabetes, Disp: 1 Units, Rfl: 0   ARIPiprazole (ABILIFY) 5 MG tablet, TAKE ONE TABLET (5 MG TOTAL) BY MOUTH DAILY, Disp: 30 tablet, Rfl: 3   baclofen (LIORESAL) 10 MG tablet, Take 0.5 tablets (5 mg total) by mouth 3 (three) times daily., Disp: 30 each, Rfl: 0   blood glucose meter kit and supplies, Dispense based on patient and insurance  preference. Use up to four times daily as directed. 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 Start the day after chemotherapy for 2 days. Take with food., Disp: 30 tablet, Rfl: 1   dicyclomine (BENTYL) 10 MG capsule, TAKE 1 CAPSULE (10 MG TOTAL) BY MOUTH 3 (THREE) TIMES DAILY BEFORE MEALS., Disp: 270 capsule, Rfl: 1   diphenoxylate-atropine (LOMOTIL) 2.5-0.025 MG tablet, TAKE 2 TABLETS BY MOUTH 4 (FOUR) TIMES DAILY AS NEEDED FOR DIARRHEA OR LOOSE STOOLS., Disp: 100 tablet, Rfl: 0   DULoxetine (CYMBALTA) 60 MG capsule, Take 60 mg by mouth daily., Disp: , Rfl:    eszopiclone (LUNESTA) 2 MG TABS tablet, Take 1 tablet (2 mg total) by mouth at bedtime as needed., Disp: 30 tablet, Rfl: 1   levothyroxine (SYNTHROID) 150 MCG tablet, Take 1 tablet (150 mcg total) by mouth daily before breakfast., Disp: 30 tablet, Rfl: 4   lidocaine-prilocaine (EMLA) cream, Apply to affected area once, Disp: 30 g, Rfl: 3   lisinopril (ZESTRIL) 20 MG tablet, Take 20 mg by mouth daily., Disp: , Rfl:  lubiprostone (AMITIZA) 8 MCG capsule, TAKE 1 CAPSULE (8 MCG TOTAL) BY MOUTH 2 (TWO) TIMES DAILY WITH A MEAL., Disp: 180 capsule, Rfl: 2   mirtazapine (REMERON SOL-TAB) 15 MG disintegrating tablet, Take 15 mg by mouth daily., Disp: , Rfl:    NUVIGIL 250 MG tablet, Take 250 mg by mouth daily., Disp: , Rfl:    ondansetron (ZOFRAN) 8 MG tablet, Take 1 tablet (8 mg total) by mouth 2 (two) times daily as needed for refractory nausea / vomiting. Start on day 3 after chemotherapy., Disp: 30 tablet, Rfl: 1   pantoprazole (PROTONIX) 40 MG tablet, Take 1 tablet by mouth daily., Disp: , Rfl:    potassium chloride SA (KLOR-CON) 20 MEQ tablet, Take 2 tablets (40 mEq total) by mouth 2 (two) times daily., Disp: 120 tablet, Rfl: 3   prednisoLONE acetate (PRED FORTE) 1 % ophthalmic  suspension, 1 drop 2 (two) times daily., Disp: , Rfl:    pregabalin (LYRICA) 200 MG capsule, Take 1 capsule by mouth 3 (three) times daily., Disp: , Rfl:    prochlorperazine (COMPAZINE) 10 MG tablet, Take 1 tablet (10 mg total) by mouth every 6 (six) hours as needed (Nausea or vomiting)., Disp: 30 tablet, Rfl: 1   SODIUM FLUORIDE 5000 PPM 1.1 % PSTE, SMARTSIG:Sparingly Topical Every Night, Disp: , Rfl:    pentoxifylline (TRENTAL) 400 MG CR tablet, Take by mouth. (Patient not taking: Reported on 02/18/2021), Disp: , Rfl:   Current Facility-Administered Medications:    dexamethasone (DECADRON) injection 10 mg, 10 mg, Intravenous, Once, Demba Nigh, Rudell Cobb, MD  Facility-Administered Medications Ordered in Other Visits:    dextrose 5 % solution, , Intravenous, Once, Aasia Peavler, Rudell Cobb, MD   fluorouracil (ADRUCIL) 4,150 mg in sodium chloride 0.9 % 67 mL chemo infusion, 1,920 mg/m2 (Treatment Plan Recorded), Intravenous, 1 day or 1 dose, Marijke Guadiana, Rudell Cobb, MD   fluorouracil (ADRUCIL) chemo injection 700 mg, 320 mg/m2 (Treatment Plan Recorded), Intravenous, Once, Gala Padovano, Rudell Cobb, MD   leucovorin 864 mg in dextrose 5 % 250 mL infusion, 400 mg/m2 (Treatment Plan Recorded), Intravenous, Once, Linsey Arteaga, Rudell Cobb, MD   nivolumab (OPDIVO) 480 mg in sodium chloride 0.9 % 100 mL chemo infusion, 480 mg, Intravenous, Once, Kaylen Nghiem, Rudell Cobb, MD   oxaliplatin (ELOXATIN) 150 mg in dextrose 5 % 500 mL chemo infusion, 69 mg/m2 (Treatment Plan Recorded), Intravenous, Once, Joy Haegele, Rudell Cobb, MD   trastuzumab-dkst (OGIVRI) 357 mg in sodium chloride 0.9 % 250 mL chemo infusion, 4 mg/kg (Treatment Plan Recorded), Intravenous, Once, Aquanetta Schwarz, Rudell Cobb, MD  Allergies: No Known Allergies  Past Medical History, Surgical history, Social history, and Family History were reviewed and updated.  Review of Systems: Review of Systems  Constitutional: Negative.   HENT:  Negative.    Eyes: Negative.   Respiratory: Negative.     Cardiovascular: Negative.   Gastrointestinal: Negative.   Endocrine: Negative.   Genitourinary: Negative.    Musculoskeletal: Negative.   Skin: Negative.   Neurological: Negative.   Hematological: Negative.   Psychiatric/Behavioral: Negative.     Physical Exam:  height is 6' (1.829 m) and weight is 201 lb (91.2 kg). His oral temperature is 97.6 F (36.4 C). His blood pressure is 132/66 and his pulse is 99. His respiration is 18 and oxygen saturation is 99%.   Wt Readings from Last 3 Encounters:  02/18/21 201 lb (91.2 kg)  01/28/21 199 lb 1.9 oz (90.3 kg)  01/22/21 202 lb (91.6 kg)    Physical Exam Vitals reviewed.  HENT:     Head: Normocephalic and atraumatic.  Eyes:     Pupils: Pupils are equal, round, and reactive to light.  Cardiovascular:     Rate and Rhythm: Normal rate and regular rhythm.     Heart sounds: Normal heart sounds.  Pulmonary:     Effort: Pulmonary effort is normal.     Breath sounds: Normal breath sounds.  Abdominal:     General: Bowel sounds are normal.     Palpations: Abdomen is soft.  Musculoskeletal:        General: No tenderness or deformity. Normal range of motion.     Cervical back: Normal range of motion.  Lymphadenopathy:     Cervical: No cervical adenopathy.  Skin:    General: Skin is warm and dry.     Findings: No erythema or rash.  Neurological:     Mental Status: He is alert and oriented to person, place, and time.  Psychiatric:        Behavior: Behavior normal.        Thought Content: Thought content normal.        Judgment: Judgment normal.     Lab Results  Component Value Date   WBC 7.1 02/18/2021   HGB 9.5 (L) 02/18/2021   HCT 28.0 (L) 02/18/2021   MCV 94.3 02/18/2021   PLT 188 02/18/2021     Chemistry      Component Value Date/Time   NA 139 02/18/2021 1115   K 3.2 (L) 02/18/2021 1115   CL 107 02/18/2021 1115   CO2 25 02/18/2021 1115   BUN 11 02/18/2021 1115   CREATININE 0.92 02/18/2021 1115   CREATININE 0.96  09/05/2020 0000      Component Value Date/Time   CALCIUM 8.7 (L) 02/18/2021 1115   ALKPHOS 103 02/18/2021 1115   AST 21 02/18/2021 1115   ALT 14 02/18/2021 1115   BILITOT 0.5 02/18/2021 1115      Impression and Plan: Mr. Jarboe is a very nice 65 year old white male.  He has metastatic adenocarcinoma of the GE junction..  We will go ahead with his 7th cycle of treatment.  Again, we will make sure he gets IV fluids.  Hopefully, he will do okay.  His quality of life is very important.  I would like to make sure that he does not get too sick with treatment that he cannot take care of the dogs that he adopted.  I will plan to see him back in 3 weeks.  I do not think we have to do another PET scan on him probably until November or December.    [name], MD 9/21/20221:01 PM

## 2021-02-18 NOTE — Progress Notes (Signed)
Patient's potasium is 3.2 today. Per Dr. Marin Olp, patient to take 2-20 meq potassium  daily x 5 days, then one tablet daily. Patient given written instructions. States he has 3 bottles of tablets at home.

## 2021-02-18 NOTE — Patient Instructions (Addendum)
[facility] [place]  Discharge Instructions: Thank you for choosing [facility] to provide your oncology and hematology care.   If you have a lab appointment with the [facility], please go directly to the [facility] and check in at the registration area.  Wear comfortable clothing and clothing appropriate for easy access to any Portacath or PICC line.   We strive to give you quality time with your provider. You may need to reschedule your appointment if you arrive late (15 or more minutes).  Arriving late affects you and other patients whose appointments are after yours.  Also, if you miss three or more appointments without notifying the office, you may be dismissed from the clinic at the provider's discretion.      For prescription refill requests, have your pharmacy contact our office and allow 72 hours for refills to be completed.    Today you received the following chemotherapy and/or immunotherapy agents 5FU, Leucovorin, Irinotecan. Ogivri, Opdivo\[id number]\     To help prevent nausea and vomiting after your treatment, we encourage you to take your nausea medication as directed.  BELOW ARE SYMPTOMS THAT SHOULD BE REPORTED IMMEDIATELY: *FEVER GREATER THAN 100.4 F (38 C) OR HIGHER *CHILLS OR SWEATING *NAUSEA AND VOMITING THAT IS NOT CONTROLLED WITH YOUR NAUSEA MEDICATION *UNUSUAL SHORTNESS OF BREATH *UNUSUAL BRUISING OR BLEEDING *URINARY PROBLEMS (pain or burning when urinating, or frequent urination) *BOWEL PROBLEMS (unusual diarrhea, constipation, pain near the anus) TENDERNESS IN MOUTH AND THROAT WITH OR WITHOUT PRESENCE OF ULCERS (sore throat, sores in mouth, or a toothache) UNUSUAL RASH, SWELLING OR PAIN  UNUSUAL VAGINAL DISCHARGE OR ITCHING   Items with * indicate a potential emergency and should be followed up as soon as possible or go to the Emergency Department if any problems should occur.  Please show the CHEMOTHERAPY ALERT  CARD or IMMUNOTHERAPY ALERT CARD at check-in to the Emergency Department and triage nurse. Should you have questions after your visit or need to cancel or reschedule your appointment, please contact [facility]  [phone] and follow the prompts.  Office hours are 8:00 a.m. to 4:30 p.m. Monday - Friday. Please note that voicemails left after 4:00 p.m. may not be returned until the following business day.  We are closed weekends and major holidays. You have access to a nurse at all times for urgent questions. Please call the main number to the clinic [phone] and follow the prompts.  For any non-urgent questions, you may also contact your provider using MyChart. We now offer e-Visits for anyone 37 and older to request care online for non-urgent symptoms. For details visit [email or web address].   Also download the MyChart app! Go to the app store, search "MyChart", open the app, select [facility], and log in with your MyChart username and password.  Due to Covid, a mask is required upon entering the hospital/clinic. If you do not have a mask, one will be given to you upon arrival. For doctor visits, patients may have 1 support person aged 46 or older with them. For treatment visits, patients cannot have anyone with them due to current Covid guidelines and our immunocompromised population.

## 2021-02-19 ENCOUNTER — Encounter (HOSPITAL_COMMUNITY): Payer: Self-pay

## 2021-02-19 ENCOUNTER — Emergency Department (HOSPITAL_COMMUNITY): Payer: BC Managed Care – PPO

## 2021-02-19 ENCOUNTER — Other Ambulatory Visit: Payer: Self-pay

## 2021-02-19 ENCOUNTER — Emergency Department (HOSPITAL_COMMUNITY)
Admission: EM | Admit: 2021-02-19 | Discharge: 2021-02-19 | Disposition: A | Discharge status: Home / Self Care | Payer: BC Managed Care – PPO | Attending: Emergency Medicine | Admitting: Emergency Medicine

## 2021-02-19 DIAGNOSIS — Z79899 Other long term (current) drug therapy: Secondary | ICD-10-CM | POA: Diagnosis not present

## 2021-02-19 DIAGNOSIS — I119 Hypertensive heart disease without heart failure: Secondary | ICD-10-CM | POA: Insufficient documentation

## 2021-02-19 DIAGNOSIS — Z8501 Personal history of malignant neoplasm of esophagus: Secondary | ICD-10-CM | POA: Diagnosis not present

## 2021-02-19 DIAGNOSIS — E039 Hypothyroidism, unspecified: Secondary | ICD-10-CM | POA: Insufficient documentation

## 2021-02-19 DIAGNOSIS — Z87891 Personal history of nicotine dependence: Secondary | ICD-10-CM | POA: Diagnosis not present

## 2021-02-19 DIAGNOSIS — W19XXXA Unspecified fall, initial encounter: Secondary | ICD-10-CM

## 2021-02-19 DIAGNOSIS — I2510 Atherosclerotic heart disease of native coronary artery without angina pectoris: Secondary | ICD-10-CM | POA: Diagnosis not present

## 2021-02-19 DIAGNOSIS — R918 Other nonspecific abnormal finding of lung field: Secondary | ICD-10-CM | POA: Diagnosis not present

## 2021-02-19 DIAGNOSIS — M545 Low back pain, unspecified: Secondary | ICD-10-CM | POA: Insufficient documentation

## 2021-02-19 DIAGNOSIS — W06XXXA Fall from bed, initial encounter: Secondary | ICD-10-CM | POA: Insufficient documentation

## 2021-02-19 DIAGNOSIS — R059 Cough, unspecified: Secondary | ICD-10-CM | POA: Diagnosis not present

## 2021-02-19 DIAGNOSIS — J449 Chronic obstructive pulmonary disease, unspecified: Secondary | ICD-10-CM | POA: Insufficient documentation

## 2021-02-19 DIAGNOSIS — E119 Type 2 diabetes mellitus without complications: Secondary | ICD-10-CM | POA: Insufficient documentation

## 2021-02-19 DIAGNOSIS — R000 Tachycardia, unspecified: Secondary | ICD-10-CM | POA: Diagnosis not present

## 2021-02-19 DIAGNOSIS — R9389 Abnormal findings on diagnostic imaging of other specified body structures: Secondary | ICD-10-CM

## 2021-02-19 LAB — IRON AND TIBC
Iron: 74 ug/dL (ref 42–163)
Saturation Ratios: 22 % (ref 20–55)
TIBC: 344 ug/dL (ref 202–409)
UIBC: 270 ug/dL (ref 117–376)

## 2021-02-19 LAB — COMPREHENSIVE METABOLIC PANEL
ALT: 20 U/L (ref 0–44)
AST: 30 U/L (ref 15–41)
Albumin: 3.5 g/dL (ref 3.5–5.0)
Alkaline Phosphatase: 88 U/L (ref 38–126)
Anion gap: 7 (ref 5–15)
BUN: 20 mg/dL (ref 8–23)
CO2: 23 mmol/L (ref 22–32)
Calcium: 8.6 mg/dL — ABNORMAL LOW (ref 8.9–10.3)
Chloride: 105 mmol/L (ref 98–111)
Creatinine, Ser: 1.01 mg/dL (ref 0.61–1.24)
GFR, Estimated: >60 mL/min (ref >60)
Glucose, Bld: 125 mg/dL — ABNORMAL HIGH (ref 70–99)
Potassium: 4.3 mmol/L (ref 3.5–5.1)
Sodium: 135 mmol/L (ref 135–145)
Total Bilirubin: 0.7 mg/dL (ref 0.3–1.2)
Total Protein: 6.3 g/dL — ABNORMAL LOW (ref 6.5–8.1)

## 2021-02-19 LAB — FERRITIN: Ferritin: 107 ng/mL (ref 24–336)

## 2021-02-19 LAB — CBC WITH DIFFERENTIAL/PLATELET
Abs Immature Granulocytes: 0.11 10*3/uL — ABNORMAL HIGH (ref 0.00–0.07)
Basophils Absolute: 0 10*3/uL (ref 0.0–0.1)
Basophils Relative: 0 %
Eosinophils Absolute: 0 10*3/uL (ref 0.0–0.5)
Eosinophils Relative: 0 %
HCT: 30.2 % — ABNORMAL LOW (ref 39.0–52.0)
Hemoglobin: 10 g/dL — ABNORMAL LOW (ref 13.0–17.0)
Immature Granulocytes: 1 %
Lymphocytes Relative: 1 %
Lymphs Abs: 0.1 10*3/uL — ABNORMAL LOW (ref 0.7–4.0)
MCH: 32.4 pg (ref 26.0–34.0)
MCHC: 33.1 g/dL (ref 30.0–36.0)
MCV: 97.7 fL (ref 80.0–100.0)
Monocytes Absolute: 0.5 10*3/uL (ref 0.1–1.0)
Monocytes Relative: 4 %
Neutro Abs: 13.8 10*3/uL — ABNORMAL HIGH (ref 1.7–7.7)
Neutrophils Relative %: 94 %
Platelets: 210 10*3/uL (ref 150–400)
RBC: 3.09 MIL/uL — ABNORMAL LOW (ref 4.22–5.81)
RDW: 17.7 % — ABNORMAL HIGH (ref 11.5–15.5)
WBC: 14.6 10*3/uL — ABNORMAL HIGH (ref 4.0–10.5)
nRBC: 0 % (ref 0.0–0.2)

## 2021-02-19 LAB — URINALYSIS, ROUTINE W REFLEX MICROSCOPIC
Bilirubin Urine: NEGATIVE
Glucose, UA: NEGATIVE mg/dL
Hgb urine dipstick: NEGATIVE
Ketones, ur: NEGATIVE mg/dL
Leukocytes,Ua: NEGATIVE
Nitrite: NEGATIVE
Protein, ur: NEGATIVE mg/dL
Specific Gravity, Urine: 1.01 (ref 1.005–1.030)
pH: 6.5 (ref 5.0–8.0)

## 2021-02-19 LAB — T4: T4, Total: 10.6 ug/dL (ref 4.5–12.0)

## 2021-02-19 LAB — TSH: TSH: 1.213 u[IU]/mL (ref 0.320–4.118)

## 2021-02-19 MED ORDER — ONDANSETRON 8 MG PO TBDP
8.0000 mg | ORAL_TABLET | Freq: Once | ORAL | Status: AC
  Administered 2021-02-19: 8 mg via ORAL
  Filled 2021-02-19: qty 1

## 2021-02-19 MED ORDER — DOXYCYCLINE HYCLATE 100 MG PO CAPS: 100.0000 mg | ORAL_CAPSULE | Freq: Two times a day (BID) | ORAL | 0 refills | Status: DC

## 2021-02-19 MED ORDER — DOXYCYCLINE HYCLATE 100 MG PO TABS
100.0000 mg | ORAL_TABLET | Freq: Once | ORAL | Status: AC
  Administered 2021-02-19: 100 mg via ORAL
  Filled 2021-02-19: qty 1

## 2021-02-19 NOTE — ED Triage Notes (Signed)
Pt reports falling out of bed last night and hitting his head, neck and low back. He now endorses neck and low back pain. Pt states that he believes he is on a blood thinner, but unsure what it is called. Pt is a cancer patient and had a chemo treatment yesterday. He is hx of stroke and had implants placed for seizures.

## 2021-02-19 NOTE — ED Notes (Signed)
Pt wife accompanying pt.

## 2021-02-19 NOTE — ED Provider Notes (Signed)
[facility] DEPT Provider Note   CSN: [id number] Arrival date & time: 02/19/21  5945     History Chief Complaint  Patient presents with   Eddie Hernandez is a 65 y.o. male.  HPI 65 year old man history of esophageal cancer, stage IV, on chemotherapy with current infusion on day 2 presents complaining of fall last night.  He states he went to stand up from bed and his legs became weak and he fell back striking his head, neck, low back area.  He did not have loss of consciousness.  He has been able to ambulate since that time.  He has the pain chiefly in his low back.  He called his oncologist and was instructed to come to the ED for further evaluation.  His wife states that he has been having some cough at night and feels it was worse last night and has been.  He states that he thinks he may be on blood thinners.  He denies any seizures associated with this.     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   GERD (gastroesophageal reflux disease)    Hyperlipidemia    Hypertension    Hypothyroidism    Iron deficiency anemia due to chronic blood loss 10/15/2020   PAD (peripheral artery disease) ([place])    Peripheral neuropathy    small fiber   Prostate pain    Stroke [facility])    Per CT scan - Old - pt was not aware    Tremor     Patient Active Problem List   Diagnosis Date Noted   Iron deficiency anemia due to chronic blood loss 10/15/2020   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   Goals of care, counseling/discussion 10/14/2020   Atherosclerosis of native arteries of extremity with intermittent claudication ([place]) 10/09/2020   Elevated PSA 09/17/2020   Pulmonary nodule 09/15/2020   Chronic constipation 03/28/2020   Peripheral polyneuropathy 01/23/2020   Renal cyst, left 11/13/2019   Degenerative disc disease, cervical  11/13/2019   History of lacunar cerebrovascular accident 11/13/2019   Low testosterone 09/27/2019   Fracture left fifth toe proximal phalanx 09/13/2019   Tibialis posterior tendinitis, left 09/13/2019   Chronic pain of left thumb 07/26/2019   PAD (peripheral artery disease) ([place]) 10/07/2017   Centrilobular emphysema ([place]) 09/22/2017   Aortic atherosclerosis ([place]) 09/22/2017   Coronary artery disease involving native coronary artery of native heart without angina pectoris 09/08/2017   Essential hypertension 04/15/2017   Encounter for screening colonoscopy 08/12/2016   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   Dry mouth 09/23/2015   Numbness and tingling 09/23/2015   Polyuria 09/23/2015   Attempted suicide ([place]) 06/03/2014   Hyperglycemia 02/15/2013   Vitreous detachment of both eyes 08/10/2012   TOBACCO ABUSE 06/16/2010   TESTICULAR HYPOFUNCTION 10/08/2009   IBS 07/22/2009   COPD, group B, by GOLD 2017 classification ([place]) 04/21/2009   HIP PAIN, RIGHT 04/14/2009   LIBIDO, DECREASED 04/14/2009   Controlled diabetes mellitus type 2 with complications ([place]) 85/92/9244   Hypothyroidism 04/13/2007   Hyperlipidemia 04/11/2006   Moderate depressed bipolar I disorder ([place]) 04/11/2006   ABUSE, ALCOHOL, UNSPECIFIED 04/11/2006   GERD 04/11/2006   Abnormal involuntary movement 04/11/2006    Past Surgical History:  Procedure Laterality Date   brain stimulator removed  COLONOSCOPY     DEEP BRAIN STIMULATOR PLACEMENT  01-30-08   tremors   ILIAC ARTERY STENT     x2   IR IMAGING GUIDED PORT INSERTION  10/21/2020       Family History  Problem Relation Age of Onset   Stroke Father    Alcoholism Father    Cancer Sister        Lung    Colon cancer Neg Hx    Colon polyps Neg Hx    Esophageal cancer Neg Hx    Rectal cancer Neg Hx    Stomach cancer Neg Hx     Social History   Tobacco Use   Smoking status: Former    Packs/day: 1.00    Years: 45.00    Pack  years: 45.00    Types: Cigarettes    Quit date: 09/08/2017    Years since quitting: 3.4   Smokeless tobacco: Never  Vaping Use   Vaping Use: Never used  Substance Use Topics   Alcohol use: No    Alcohol/week: 0.0 standard drinks    Comment: hx of EtOH abuse   Drug use: No    Home Medications Prior to Admission medications   Medication Sig Start Date End Date Taking? Authorizing Provider  acetaminophen (TYLENOL) 500 MG tablet Take 1,000 mg by mouth every 6 (six) hours as needed.    [provider]  AMBULATORY NON FORMULARY MEDICATION Medication Name: Glucometer and strips to test up to once a day. Dx diabetes 01/21/21   Hali Marry, MD  ARIPiprazole (ABILIFY) 5 MG tablet TAKE ONE TABLET (5 MG TOTAL) BY MOUTH DAILY 07/21/16   Hali Marry, MD  baclofen (LIORESAL) 10 MG tablet Take 0.5 tablets (5 mg total) by mouth 3 (three) times daily. 02/12/21   [name], MD  blood glucose meter kit and supplies Dispense based on patient and insurance preference. Use up to four times daily as directed. DX: E11.8 01/30/21   Hali Marry, MD  cilostazol (PLETAL) 100 MG tablet Take 100 mg by mouth 2 (two) times daily. 09/03/20   [provider]  dexamethasone (DECADRON) 4 MG tablet Take 2 tablets (8 mg total) by mouth daily. Start the day after chemotherapy for 2 days. Take with food. 10/16/20   [name], MD  dicyclomine (BENTYL) 10 MG capsule TAKE 1 CAPSULE (10 MG TOTAL) BY MOUTH 3 (THREE) TIMES DAILY BEFORE MEALS. 01/23/21   Hali Marry, MD  diphenoxylate-atropine (LOMOTIL) 2.5-0.025 MG tablet TAKE 2 TABLETS BY MOUTH 4 (FOUR) TIMES DAILY AS NEEDED FOR DIARRHEA OR LOOSE STOOLS. 01/21/21   [name], MD  DULoxetine (CYMBALTA) 60 MG capsule Take 60 mg by mouth daily. 11/18/20   [provider]  eszopiclone (LUNESTA) 2 MG TABS tablet Take 1 tablet (2 mg total) by mouth at bedtime as needed. 01/21/21   Hali Marry, MD   levothyroxine (SYNTHROID) 150 MCG tablet Take 1 tablet (150 mcg total) by mouth daily before breakfast. 12/04/20   [name], MD  lidocaine-prilocaine (EMLA) cream Apply to affected area once 10/16/20   [name], MD  lisinopril (ZESTRIL) 20 MG tablet Take 20 mg by mouth daily. 12/21/19   Powers, Elyse Jarvis, MD  lubiprostone (AMITIZA) 8 MCG capsule TAKE 1 CAPSULE (8 MCG TOTAL) BY MOUTH 2 (TWO) TIMES DAILY WITH A MEAL. 11/10/20   Hali Marry, MD  mirtazapine (REMERON SOL-TAB) 15 MG disintegrating tablet Take 15 mg by mouth daily.  01/30/15   [provider]  NUVIGIL 250 MG tablet Take 250 mg by mouth daily. 03/18/15   [provider]  ondansetron (ZOFRAN) 8 MG tablet Take 1 tablet (8 mg total) by mouth 2 (two) times daily as needed for refractory nausea / vomiting. Start on day 3 after chemotherapy. 10/16/20   [name], MD  pantoprazole (PROTONIX) 40 MG tablet Take 1 tablet by mouth daily. 08/29/20   [provider]  pentoxifylline (TRENTAL) 400 MG CR tablet Take by mouth. Patient not taking: Reported on 02/18/2021 08/07/19   [provider]  potassium chloride SA (KLOR-CON) 20 MEQ tablet Take 2 tablets (40 mEq total) by mouth 2 (two) times daily. 12/04/20   [name], MD  prednisoLONE acetate (PRED FORTE) 1 % ophthalmic suspension 1 drop 2 (two) times daily. 12/09/20   [provider]  pregabalin (LYRICA) 200 MG capsule Take 1 capsule by mouth 3 (three) times daily. 07/24/19   [provider]  prochlorperazine (COMPAZINE) 10 MG tablet Take 1 tablet (10 mg total) by mouth every 6 (six) hours as needed (Nausea or vomiting). 01/23/21   [name], MD  SODIUM FLUORIDE 5000 PPM 1.1 % PSTE SMARTSIG:Sparingly Topical Every Night 08/28/20   [provider]    Allergies    Patient has no known allergies.  Review of Systems   Review of Systems  Constitutional:  Positive for chills.  HENT: Negative.    Eyes: Negative.    Respiratory: Negative.    Cardiovascular: Negative.   Gastrointestinal: Negative.   Endocrine: Negative.   Genitourinary: Negative.   Musculoskeletal:  Positive for back pain.  Skin: Negative.   Allergic/Immunologic: Negative.   Neurological: Negative.   Hematological: Negative.   Psychiatric/Behavioral: Negative.    All other systems reviewed and are negative.  Physical Exam Updated Vital Signs BP (!) 110/55   Pulse 81   Temp 98.8 F (37.1 C) (Oral)   Resp 18   SpO2 97%   Physical Exam Vitals and nursing note reviewed.  Constitutional:      Appearance: Normal appearance.  HENT:     Head: Normocephalic.     Right Ear: External ear normal.     Left Ear: External ear normal.     Nose: Nose normal.     Mouth/Throat:     Pharynx: Oropharynx is clear.  Eyes:     Extraocular Movements: Extraocular movements intact.     Pupils: Pupils are equal, round, and reactive to light.  Cardiovascular:     Rate and Rhythm: Regular rhythm. Tachycardia present.     Pulses: Normal pulses.  Pulmonary:     Effort: Pulmonary effort is normal.  Abdominal:     Palpations: Abdomen is soft.  Musculoskeletal:        General: Normal range of motion.     Cervical back: Normal range of motion. Tenderness present.  Skin:    General: Skin is warm.  Neurological:     General: No focal deficit present.     Mental Status: He is alert.  Psychiatric:        Mood and Affect: Mood normal.    ED Results / Procedures / Treatments   Labs (all labs ordered are listed, but only abnormal results are displayed) Labs Reviewed  CBC WITH DIFFERENTIAL/PLATELET - Abnormal; Notable for the following components:      Result Value   WBC 14.6 (*)    RBC 3.09 (*)    Hemoglobin 10.0 (*)  HCT 30.2 (*)    RDW 17.7 (*)    Neutro Abs 13.8 (*)    Lymphs Abs 0.1 (*)    Abs Immature Granulocytes 0.11 (*)    All other components within normal limits  COMPREHENSIVE METABOLIC PANEL - Abnormal; Notable for the  following components:   Glucose, Bld 125 (*)    Calcium 8.6 (*)    Total Protein 6.3 (*)    All other components within normal limits  CULTURE, BLOOD (ROUTINE X 2)  CULTURE, BLOOD (ROUTINE X 2)  URINALYSIS, ROUTINE W REFLEX MICROSCOPIC    EKG EKG Interpretation  Date/Time:  Thursday February 19 2021 10:48:34 EDT Ventricular Rate:  99 PR Interval:  197 QRS Duration: 96 QT Interval:  372 QTC Calculation: 478 R Axis:   38 Text Interpretation: Normal sinus rhythm Ventricular trigeminy Borderline prolonged QT interval Confirmed by Pattricia Boss [phone]) on 02/19/2021 12:14:59 PM  Radiology DG Chest 2 View  Result Date: 02/19/2021 CLINICAL DATA:  Fall EXAM: CHEST - 2 VIEW COMPARISON:  Chest radiograph 01/22/2021 FINDINGS: A left chest wall port is in stable position with the tip terminating in the lower SVC. Additional leads coiled over the right hemithorax are unchanged. The cardiomediastinal silhouette is stable. Patchy opacity in the left lower lobe has increased since the prior study. There is no other focal consolidation. There is no overt pulmonary edema. There is no pleural effusion or pneumothorax. There is no acute osseous abnormality. IMPRESSION: Patchy opacity in the left base is increased since the prior study and could reflect pneumonia in the correct clinical setting. Electronically Signed   By: Valetta Mole M.D.   On: 02/19/2021 13:09   DG Lumbar Spine Complete  Result Date: 02/19/2021 CLINICAL DATA:  Fall EXAM: LUMBAR SPINE - COMPLETE 4+ VIEW COMPARISON:  CT abdomen/pelvis 01/22/2021 FINDINGS: There are 5 non-rib-bearing lumbar type vertebral bodies. Vertebral body heights are preserved, without evidence of acute fracture. Alignment is normal. The disc spaces are preserved. There is minimal degenerative change in the lumbar spine. Vascular stents are noted.  The SI joints are intact. IMPRESSION: No acute findings in the lumbar spine. Electronically Signed   By: Valetta Mole M.D.    On: 02/19/2021 13:04   CT HEAD WO CONTRAST (5MM)  Result Date: 02/19/2021 CLINICAL DATA:  Fall from bed last night hitting head and neck. EXAM: CT HEAD WITHOUT CONTRAST CT CERVICAL SPINE WITHOUT CONTRAST TECHNIQUE: Multidetector CT imaging of the head and cervical spine was performed following the standard protocol without intravenous contrast. Multiplanar CT image reconstructions of the cervical spine were also generated. COMPARISON:  01/22/2021 FINDINGS: CT HEAD FINDINGS Brain: No evidence of acute infarction, hemorrhage, hydrocephalus, extra-axial collection or mass lesion/mass effect. There is mild diffuse low-attenuation within the subcortical and periventricular white matter compatible with chronic microvascular disease. Prominence of sulci and ventricles compatible with brain atrophy. Bilateral trans frontal stimulator leads with tips terminating in the thalamic regions. Vascular: No hyperdense vessel or unexpected calcification. Skull: Normal. Negative for fracture or focal lesion. Sinuses/Orbits: Retention cyst or polyp noted in the right maxillary sinus. Paranasal sinuses and mastoid air cells are otherwise clear. Other: None CT CERVICAL SPINE FINDINGS Alignment: Normal. Skull base and vertebrae: No acute fracture. No primary bone lesion or focal pathologic process. Soft tissues and spinal canal: No prevertebral fluid or swelling. No visible canal hematoma. Disc levels: Multilevel disc space narrowing and endplate spurring identified at C5-6 and C6-7. Facet joints appear aligned. Upper chest: No acute findings Other: None  IMPRESSION: 1. No acute intracranial abnormalities. 2. Chronic small vessel ischemic change and brain atrophy. 3. No evidence for cervical spine fracture. 4. Cervical degenerative disc disease. Electronically Signed   By: Kerby Moors M.D.   On: 02/19/2021 11:58   CT Cervical Spine Wo Contrast  Result Date: 02/19/2021 CLINICAL DATA:  Fall from bed last night hitting head and  neck. EXAM: CT HEAD WITHOUT CONTRAST CT CERVICAL SPINE WITHOUT CONTRAST TECHNIQUE: Multidetector CT imaging of the head and cervical spine was performed following the standard protocol without intravenous contrast. Multiplanar CT image reconstructions of the cervical spine were also generated. COMPARISON:  01/22/2021 FINDINGS: CT HEAD FINDINGS Brain: No evidence of acute infarction, hemorrhage, hydrocephalus, extra-axial collection or mass lesion/mass effect. There is mild diffuse low-attenuation within the subcortical and periventricular white matter compatible with chronic microvascular disease. Prominence of sulci and ventricles compatible with brain atrophy. Bilateral trans frontal stimulator leads with tips terminating in the thalamic regions. Vascular: No hyperdense vessel or unexpected calcification. Skull: Normal. Negative for fracture or focal lesion. Sinuses/Orbits: Retention cyst or polyp noted in the right maxillary sinus. Paranasal sinuses and mastoid air cells are otherwise clear. Other: None CT CERVICAL SPINE FINDINGS Alignment: Normal. Skull base and vertebrae: No acute fracture. No primary bone lesion or focal pathologic process. Soft tissues and spinal canal: No prevertebral fluid or swelling. No visible canal hematoma. Disc levels: Multilevel disc space narrowing and endplate spurring identified at C5-6 and C6-7. Facet joints appear aligned. Upper chest: No acute findings Other: None IMPRESSION: 1. No acute intracranial abnormalities. 2. Chronic small vessel ischemic change and brain atrophy. 3. No evidence for cervical spine fracture. 4. Cervical degenerative disc disease. Electronically Signed   By: Kerby Moors M.D.   On: 02/19/2021 11:58    Procedures .Critical Care Performed by: Pattricia Boss, MD Authorized by: Pattricia Boss, MD   Critical care provider statement:    Critical care time (minutes):  45   Critical care was time spent personally by me on the following activities:   Discussions with consultants, evaluation of patient's response to treatment, examination of patient, ordering and performing treatments and interventions, ordering and review of laboratory studies, ordering and review of radiographic studies, pulse oximetry, re-evaluation of patient's condition, obtaining history from patient or surrogate, review of old charts, development of treatment plan with patient or surrogate and discussions with primary provider   Medications Ordered in ED Medications  ondansetron (ZOFRAN-ODT) disintegrating tablet 8 mg (8 mg Oral Given 02/19/21 1058)    ED Course  I have reviewed the triage vital signs and the nursing notes.  Pertinent labs & imaging results that were available during my care of the patient were reviewed by me and considered in my medical decision making (see chart for details).    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  Patient had x-Nixie Laube of lumbar spine some pain at time of fall.  CT head and neck without acute abnormalities.  He had elevated white blood cell count which is likely secondary to the Neulasta and blood cultures were obtained.  Chest x-Joniece Smotherman is concerning for an infiltrate in the left lower lobe although it is uncertain if this is of infectious etiology given changes noted due to his cancer.  Patient is  afebrile and no significant infectious symptoms.  Leukocytosis noted, but patient has recently had neulasta post his last chemo.  Additionally, patient had some tonic jerks at the time last night.  He was awake and alert and does not think that this is seizure.  He does have some these spasms in his hands at times but describes this as his entire body having some jerking sensations. Discussed above with Dr. Marin Olp.  Plan outpatient doxycycline.  If patient has any continued jerking sensations,  Dr. Marin Olp will refer to neurology.  Dr. Loreli Slot  office will call tomorrow to check on patient. Patient advised regarding return precautions and need for follow-up and voiced understanding. Final Clinical Impression(s) / ED Diagnoses Final diagnoses:  Fall, initial encounter  Abnormal chest x-Kip Kautzman    Rx / DC Orders ED Discharge Orders     None        Pattricia Boss, MD 02/24/21 1238

## 2021-02-19 NOTE — Discharge Instructions (Addendum)
There is a question of an early pneumonia on your chest x-Eddie Hernandez.  Due to your recent chemotherapy treatment, we will go ahead and start antibiotics.  I discussed the above with Dr. Marin Olp.  His office will call you tomorrow to check on you. I did discuss the jerking episodes with Dr. Marin Olp.  If you have any more these please let him know he will give you a consult to neurology Please return the emergency department if you have become acutely worse, especially running high fever, short of breath, or any new complaints or problems

## 2021-02-20 ENCOUNTER — Encounter: Payer: Self-pay | Admitting: Family Medicine

## 2021-02-20 ENCOUNTER — Telehealth: Payer: Self-pay | Admitting: General Practice

## 2021-02-20 ENCOUNTER — Inpatient Hospital Stay: Payer: BC Managed Care – PPO

## 2021-02-20 ENCOUNTER — Telehealth: Payer: Self-pay | Admitting: *Deleted

## 2021-02-20 VITALS — BP 152/75 | HR 86 | Temp 97.7°F | Resp 18

## 2021-02-20 DIAGNOSIS — C160 Malignant neoplasm of cardia: Secondary | ICD-10-CM | POA: Diagnosis not present

## 2021-02-20 MED ORDER — PEGFILGRASTIM-JMDB 6 MG/0.6ML ~~LOC~~ SOSY
6.0000 mg | PREFILLED_SYRINGE | Freq: Once | SUBCUTANEOUS | Status: AC
  Administered 2021-02-20: 6 mg via SUBCUTANEOUS
  Filled 2021-02-20: qty 0.6

## 2021-02-20 MED ORDER — HEPARIN SOD (PORK) LOCK FLUSH 100 UNIT/ML IV SOLN
500.0000 [IU] | Freq: Once | INTRAVENOUS | Status: AC | PRN
  Administered 2021-02-20: 500 [IU]

## 2021-02-20 MED ORDER — SODIUM CHLORIDE 0.9 % IV SOLN: INTRAVENOUS | Status: DC

## 2021-02-20 MED ORDER — SODIUM CHLORIDE 0.9% FLUSH
10.0000 mL | INTRAVENOUS | Status: DC | PRN
  Administered 2021-02-20: 10 mL

## 2021-02-20 NOTE — Telephone Encounter (Signed)
What med does he need?

## 2021-02-20 NOTE — Telephone Encounter (Signed)
Per 02/18/21 los - gave upcoming appointments - view mychart

## 2021-02-20 NOTE — Patient Instructions (Signed)

## 2021-02-20 NOTE — Telephone Encounter (Signed)
Call placed to check on patient's status after ER visit yesterday.  Pt states that he is feeling okay, but would like to get some IVF's today with his pump d/c.  Pt notified to come in today at 3:00PM for IVF's per order of Dr. Marin Olp.  Scheduling notfied.

## 2021-02-20 NOTE — Progress Notes (Signed)
Pt. Will get IV NS , 1L over one hour per Dr. Antonieta Pert verbal order.

## 2021-02-20 NOTE — Telephone Encounter (Signed)
Transition Care Management Unsuccessful Follow-up Telephone Call  Date of discharge and from where:  02/19/21 from [facility]  Attempts:  1st Attempt  Reason for unsuccessful TCM follow-up call:  Left voice message

## 2021-02-23 ENCOUNTER — Encounter (HOSPITAL_COMMUNITY): Payer: Self-pay

## 2021-02-23 ENCOUNTER — Telehealth: Payer: Self-pay | Admitting: Family Medicine

## 2021-02-23 ENCOUNTER — Other Ambulatory Visit: Payer: Self-pay | Admitting: Family Medicine

## 2021-02-23 ENCOUNTER — Encounter (HOSPITAL_COMMUNITY): Payer: BC Managed Care – PPO | Admitting: Dietician

## 2021-02-23 MED ORDER — ESZOPICLONE 2 MG PO TABS: 2.0000 mg | ORAL_TABLET | Freq: Every evening | ORAL | 5 refills | Status: AC | PRN

## 2021-02-23 MED ORDER — ATORVASTATIN CALCIUM 40 MG PO TABS: 40.0000 mg | ORAL_TABLET | Freq: Every evening | ORAL | 3 refills | Status: AC

## 2021-02-23 NOTE — Telephone Encounter (Signed)
Transition Care Management Follow-up Telephone Call Date of discharge and from where: 02/19/21 from [facility] How have you been since you were released from the hospital? Doing better.  Any questions or concerns? No  Items Reviewed: Did the pt receive and understand the discharge instructions provided? Yes  Medications obtained and verified? Yes  Other? No  Any new allergies since your discharge? No  Dietary orders reviewed? Yes Do you have support at home? Yes   Home Care and Equipment/Supplies: Were home health services ordered? no   Functional Questionnaire: (I = Independent and D = Dependent) ADLs: I  Bathing/Dressing- I  Meal Prep- I  Eating- I  Maintaining continence- I  Transferring/Ambulation- I  Managing Meds- I  Follow up appointments reviewed:  PCP Hospital f/u appt confirmed? No   Specialist Hospital f/u appt confirmed? No   Are transportation arrangements needed? No  If their condition worsens, is the pt aware to call PCP or go to the Emergency Dept.? Yes Was the patient provided with contact information for the PCP's office or ED? Yes Was to pt encouraged to call back with questions or concerns? Yes

## 2021-02-23 NOTE — Telephone Encounter (Signed)
-----   Message from Tinnie Gens, RN sent at 02/23/2021  8:13 AM EDT ----- Regarding: Refills I called the patient about his hospital visit and during that call he asked for refills on his medications. Lunesta and Atorvastatin. He did mention that he is taking 40 mg of Atorvastatin and I told him I saw that he was only prescribed 20 mg but he stated he was trying to get his cholesterol under control and started two tablets. He was wondering if refills could be sent in to his pharmacy.

## 2021-02-23 NOTE — Telephone Encounter (Signed)
Meds ordered this encounter  Medications   atorvastatin (LIPITOR) 40 MG tablet    Sig: Take 1 tablet (40 mg total) by mouth at bedtime. Needs labs    Dispense:  90 tablet    Refill:  3   eszopiclone (LUNESTA) 2 MG TABS tablet    Sig: Take 1 tablet (2 mg total) by mouth at bedtime as needed.    Dispense:  30 tablet    Refill:  5

## 2021-02-24 ENCOUNTER — Inpatient Hospital Stay: Payer: BC Managed Care – PPO | Admitting: Dietician

## 2021-02-24 ENCOUNTER — Telehealth: Payer: Self-pay | Admitting: Dietician

## 2021-02-24 LAB — CULTURE, BLOOD (ROUTINE X 2)
Culture: NO GROWTH
Special Requests: ADEQUATE

## 2021-02-24 NOTE — Telephone Encounter (Signed)
Nutrition Follow-up:  Patient metastatic HER2+ adenocarcinoma of GE junction. He is receiving FOLFOX/Nivolumab/Herceptin with Neulasta support.  Spoke with patient via telephone, he reports poor appetite. Patient states he continues trying to eat small meals, but hard "when your taste buds are shot" Sweets taste okay to him, but not to eat too many due to borderline diabetes. Yesterday he ate grits and sausage for breakfast, oatmeal, pear, banana during the day,  chicken, noodles, gravy for dinner. He drank 1 Boost, CIB and 4-5 (16 oz) bottles of water. Patient reports diarrhea is improved, he is no longer taking medication. Patient is managing bowel movements with fiber. He eats a pear if he is constipated and banana if stools are loose. Reports he had a good bowel movement this morning. Patient continues to have cold sensitivity for 3-4 day after treatment, affects his hands more than anything.    Medications: reviewed  Labs: 9/22 Glucose  Anthropometrics: Reports 184.8 lb on home scale this morning. Weight 201 lb on 9/21 stable with 199 lb 1.9 oz on 8/31 and 202 lb on 8/24   NUTRITION DIAGNOSIS: Inadequate oral intake ongoing    INTERVENTION:  Encouraged small frequent meals and snacks with adequate calories and protein Continue drinking CIB and Boost supplements, suggested mixing CIB with fairlife milk for added protein Discussed strategies for altered taste - will mail handout Encouraged baking soda salt water rinses several times daily - will mail recipe Encouraged activity as able  Patient has contact information    MONITORING, EVALUATION, GOAL:  Weight trends, intake   NEXT VISIT: Wednesday, November 2 via telephone (pt aware)

## 2021-02-25 ENCOUNTER — Other Ambulatory Visit: Payer: Self-pay

## 2021-02-25 ENCOUNTER — Inpatient Hospital Stay: Payer: BC Managed Care – PPO

## 2021-02-25 VITALS — BP 126/69 | HR 70 | Resp 17

## 2021-02-25 DIAGNOSIS — C160 Malignant neoplasm of cardia: Secondary | ICD-10-CM

## 2021-02-25 DIAGNOSIS — D500 Iron deficiency anemia secondary to blood loss (chronic): Secondary | ICD-10-CM

## 2021-02-25 LAB — CMP (CANCER CENTER ONLY)
ALT: 14 U/L (ref 0–44)
AST: 17 U/L (ref 15–41)
Albumin: 3.8 g/dL (ref 3.5–5.0)
Alkaline Phosphatase: 133 U/L — ABNORMAL HIGH (ref 38–126)
Anion gap: 8 (ref 5–15)
BUN: 14 mg/dL (ref 8–23)
CO2: 26 mmol/L (ref 22–32)
Calcium: 9.1 mg/dL (ref 8.9–10.3)
Chloride: 106 mmol/L (ref 98–111)
Creatinine: 0.95 mg/dL (ref 0.61–1.24)
GFR, Estimated: >60 mL/min (ref >60)
Glucose, Bld: 111 mg/dL — ABNORMAL HIGH (ref 70–99)
Potassium: 3.9 mmol/L (ref 3.5–5.1)
Sodium: 140 mmol/L (ref 135–145)
Total Bilirubin: 0.4 mg/dL (ref 0.3–1.2)
Total Protein: 6 g/dL — ABNORMAL LOW (ref 6.5–8.1)

## 2021-02-25 LAB — CBC WITH DIFFERENTIAL (CANCER CENTER ONLY)
Abs Immature Granulocytes: 0.14 10*3/uL — ABNORMAL HIGH (ref 0.00–0.07)
Basophils Absolute: 0.1 10*3/uL (ref 0.0–0.1)
Basophils Relative: 1 %
Eosinophils Absolute: 0.3 10*3/uL (ref 0.0–0.5)
Eosinophils Relative: 2 %
HCT: 30.5 % — ABNORMAL LOW (ref 39.0–52.0)
Hemoglobin: 10.1 g/dL — ABNORMAL LOW (ref 13.0–17.0)
Immature Granulocytes: 1 %
Lymphocytes Relative: 12 %
Lymphs Abs: 1.5 10*3/uL (ref 0.7–4.0)
MCH: 32.1 pg (ref 26.0–34.0)
MCHC: 33.1 g/dL (ref 30.0–36.0)
MCV: 96.8 fL (ref 80.0–100.0)
Monocytes Absolute: 0.5 10*3/uL (ref 0.1–1.0)
Monocytes Relative: 4 %
Neutro Abs: 10 10*3/uL — ABNORMAL HIGH (ref 1.7–7.7)
Neutrophils Relative %: 80 %
Platelet Count: 142 10*3/uL — ABNORMAL LOW (ref 150–400)
RBC: 3.15 MIL/uL — ABNORMAL LOW (ref 4.22–5.81)
RDW: 16.4 % — ABNORMAL HIGH (ref 11.5–15.5)
WBC Count: 12.4 10*3/uL — ABNORMAL HIGH (ref 4.0–10.5)
nRBC: 0 % (ref 0.0–0.2)

## 2021-02-25 LAB — TSH: TSH: 2.922 u[IU]/mL (ref 0.320–4.118)

## 2021-02-25 MED ORDER — HEPARIN SOD (PORK) LOCK FLUSH 100 UNIT/ML IV SOLN
250.0000 [IU] | Freq: Once | INTRAVENOUS | Status: AC | PRN
  Administered 2021-02-25: 500 [IU]

## 2021-02-25 MED ORDER — SODIUM CHLORIDE 0.9 % IV SOLN: Freq: Once | INTRAVENOUS | Status: AC

## 2021-02-25 MED ORDER — SODIUM CHLORIDE 0.9% FLUSH
10.0000 mL | Freq: Once | INTRAVENOUS | Status: AC | PRN
  Administered 2021-02-25: 10 mL

## 2021-02-25 NOTE — Patient Instructions (Signed)

## 2021-02-26 LAB — T4: T4, Total: 9.6 ug/dL (ref 4.5–12.0)

## 2021-03-02 ENCOUNTER — Other Ambulatory Visit: Payer: Self-pay | Admitting: Hematology & Oncology

## 2021-03-03 ENCOUNTER — Ambulatory Visit: Payer: BC Managed Care – PPO | Admitting: Hematology & Oncology

## 2021-03-03 ENCOUNTER — Other Ambulatory Visit: Payer: BC Managed Care – PPO

## 2021-03-03 ENCOUNTER — Ambulatory Visit: Payer: BC Managed Care – PPO

## 2021-03-04 ENCOUNTER — Inpatient Hospital Stay: Payer: BC Managed Care – PPO | Attending: Hematology & Oncology

## 2021-03-04 ENCOUNTER — Other Ambulatory Visit: Payer: Self-pay

## 2021-03-04 ENCOUNTER — Inpatient Hospital Stay: Payer: BC Managed Care – PPO

## 2021-03-04 VITALS — BP 128/75 | HR 87 | Temp 98.1°F | Resp 17

## 2021-03-04 DIAGNOSIS — R630 Anorexia: Secondary | ICD-10-CM | POA: Insufficient documentation

## 2021-03-04 DIAGNOSIS — C160 Malignant neoplasm of cardia: Secondary | ICD-10-CM | POA: Insufficient documentation

## 2021-03-04 DIAGNOSIS — R112 Nausea with vomiting, unspecified: Secondary | ICD-10-CM | POA: Insufficient documentation

## 2021-03-04 DIAGNOSIS — R110 Nausea: Secondary | ICD-10-CM | POA: Diagnosis not present

## 2021-03-04 DIAGNOSIS — R253 Fasciculation: Secondary | ICD-10-CM | POA: Insufficient documentation

## 2021-03-04 DIAGNOSIS — R197 Diarrhea, unspecified: Secondary | ICD-10-CM | POA: Insufficient documentation

## 2021-03-04 DIAGNOSIS — R634 Abnormal weight loss: Secondary | ICD-10-CM | POA: Insufficient documentation

## 2021-03-04 DIAGNOSIS — R530 Neoplastic (malignant) related fatigue: Secondary | ICD-10-CM | POA: Insufficient documentation

## 2021-03-04 DIAGNOSIS — Z79899 Other long term (current) drug therapy: Secondary | ICD-10-CM | POA: Diagnosis not present

## 2021-03-04 DIAGNOSIS — D500 Iron deficiency anemia secondary to blood loss (chronic): Secondary | ICD-10-CM | POA: Insufficient documentation

## 2021-03-04 DIAGNOSIS — G629 Polyneuropathy, unspecified: Secondary | ICD-10-CM | POA: Insufficient documentation

## 2021-03-04 DIAGNOSIS — R0602 Shortness of breath: Secondary | ICD-10-CM | POA: Diagnosis not present

## 2021-03-04 DIAGNOSIS — R42 Dizziness and giddiness: Secondary | ICD-10-CM | POA: Diagnosis not present

## 2021-03-04 DIAGNOSIS — R2689 Other abnormalities of gait and mobility: Secondary | ICD-10-CM | POA: Diagnosis not present

## 2021-03-04 LAB — CBC WITH DIFFERENTIAL (CANCER CENTER ONLY)
Abs Immature Granulocytes: 0.01 10*3/uL (ref 0.00–0.07)
Basophils Absolute: 0 10*3/uL (ref 0.0–0.1)
Basophils Relative: 1 %
Eosinophils Absolute: 0.1 10*3/uL (ref 0.0–0.5)
Eosinophils Relative: 4 %
HCT: 29.5 % — ABNORMAL LOW (ref 39.0–52.0)
Hemoglobin: 10 g/dL — ABNORMAL LOW (ref 13.0–17.0)
Immature Granulocytes: 0 %
Lymphocytes Relative: 28 %
Lymphs Abs: 0.8 10*3/uL (ref 0.7–4.0)
MCH: 32.2 pg (ref 26.0–34.0)
MCHC: 33.9 g/dL (ref 30.0–36.0)
MCV: 94.9 fL (ref 80.0–100.0)
Monocytes Absolute: 0.6 10*3/uL (ref 0.1–1.0)
Monocytes Relative: 20 %
Neutro Abs: 1.4 10*3/uL — ABNORMAL LOW (ref 1.7–7.7)
Neutrophils Relative %: 47 %
Platelet Count: 131 10*3/uL — ABNORMAL LOW (ref 150–400)
RBC: 3.11 MIL/uL — ABNORMAL LOW (ref 4.22–5.81)
RDW: 15.6 % — ABNORMAL HIGH (ref 11.5–15.5)
WBC Count: 2.9 10*3/uL — ABNORMAL LOW (ref 4.0–10.5)
nRBC: 0 % (ref 0.0–0.2)

## 2021-03-04 LAB — CMP (CANCER CENTER ONLY)
ALT: 17 U/L (ref 0–44)
AST: 23 U/L (ref 15–41)
Albumin: 3.7 g/dL (ref 3.5–5.0)
Alkaline Phosphatase: 128 U/L — ABNORMAL HIGH (ref 38–126)
Anion gap: 6 (ref 5–15)
BUN: 9 mg/dL (ref 8–23)
CO2: 25 mmol/L (ref 22–32)
Calcium: 9.1 mg/dL (ref 8.9–10.3)
Chloride: 103 mmol/L (ref 98–111)
Creatinine: 0.85 mg/dL (ref 0.61–1.24)
GFR, Estimated: >60 mL/min (ref >60)
Glucose, Bld: 81 mg/dL (ref 70–99)
Potassium: 3.4 mmol/L — ABNORMAL LOW (ref 3.5–5.1)
Sodium: 134 mmol/L — ABNORMAL LOW (ref 135–145)
Total Bilirubin: 0.4 mg/dL (ref 0.3–1.2)
Total Protein: 5.8 g/dL — ABNORMAL LOW (ref 6.5–8.1)

## 2021-03-04 LAB — TSH: TSH: 3.854 u[IU]/mL (ref 0.320–4.118)

## 2021-03-04 MED ORDER — SODIUM CHLORIDE 0.9% FLUSH
10.0000 mL | Freq: Once | INTRAVENOUS | Status: AC | PRN
  Administered 2021-03-04: 10 mL

## 2021-03-04 MED ORDER — HEPARIN SOD (PORK) LOCK FLUSH 100 UNIT/ML IV SOLN
500.0000 [IU] | Freq: Once | INTRAVENOUS | Status: AC | PRN
  Administered 2021-03-04: 500 [IU]

## 2021-03-04 MED ORDER — SODIUM CHLORIDE 0.9 % IV SOLN: Freq: Once | INTRAVENOUS | Status: AC

## 2021-03-04 NOTE — Patient Instructions (Signed)

## 2021-03-04 NOTE — Patient Instructions (Signed)
Implanted Port Home Guide An implanted port is a device that is placed under the skin. It is usually placed in the chest. The device can be used to give IV medicine, to take blood, or for dialysis. You may have an implanted port if: You need IV medicine that would be irritating to the small veins in your hands or arms. You need IV medicines, such as antibiotics, for a long period of time. You need IV nutrition for a long period of time. You need dialysis. When you have a port, your health care provider can choose to use the port instead of veins in your arms for these procedures. You may have fewer limitations when using a port than you would if you used other types of long-term IVs, and you will likely be able to return to normal activities after your incision heals. An implanted port has two main parts: Reservoir. The reservoir is the part where a needle is inserted to give medicines or draw blood. The reservoir is round. After it is placed, it appears as a small, raised area under your skin. Catheter. The catheter is a thin, flexible tube that connects the reservoir to a vein. Medicine that is inserted into the reservoir goes into the catheter and then into the vein. How is my port accessed? To access your port: A numbing cream may be placed on the skin over the port site. Your health care provider will put on a mask and sterile gloves. The skin over your port will be cleaned carefully with a germ-killing soap and allowed to dry. Your health care provider will gently pinch the port and insert a needle into it. Your health care provider will check for a blood return to make sure the port is in the vein and is not clogged. If your port needs to remain accessed to get medicine continuously (constant infusion), your health care provider will place a clear bandage (dressing) over the needle site. The dressing and needle will need to be changed every week, or as told by your health care provider. What  is flushing? Flushing helps keep the port from getting clogged. Follow instructions from your health care provider about how and when to flush the port. Ports are usually flushed with saline solution or a medicine called heparin. The need for flushing will depend on how the port is used: If the port is only used from time to time to give medicines or draw blood, the port may need to be flushed: Before and after medicines have been given. Before and after blood has been drawn. As part of routine maintenance. Flushing may be recommended every 4-6 weeks. If a constant infusion is running, the port may not need to be flushed. Throw away any syringes in a disposal container that is meant for sharp items (sharps container). You can buy a sharps container from a pharmacy, or you can make one by using an empty hard plastic bottle with a cover. How long will my port stay implanted? The port can stay in for as long as your health care provider thinks it is needed. When it is time for the port to come out, a surgery will be done to remove it. The surgery will be similar to the procedure that was done to put the port in. Follow these instructions at home:  Flush your port as told by your health care provider. If you need an infusion over several days, follow instructions from your health care provider about how   to take care of your port site. Make sure you: Wash your hands with soap and water before you change your dressing. If soap and water are not available, use alcohol-based hand sanitizer. Change your dressing as told by your health care provider. Place any used dressings or infusion bags into a plastic bag. Throw that bag in the trash. Keep the dressing that covers the needle clean and dry. Do not get it wet. Do not use scissors or sharp objects near the tube. Keep the tube clamped, unless it is being used. Check your port site every day for signs of infection. Check for: Redness, swelling, or  pain. Fluid or blood. Pus or a bad smell. Protect the skin around the port site. Avoid wearing bra straps that rub or irritate the site. Protect the skin around your port from seat belts. Place a soft pad over your chest if needed. Bathe or shower as told by your health care provider. The site may get wet as long as you are not actively receiving an infusion. Return to your normal activities as told by your health care provider. Ask your health care provider what activities are safe for you. Carry a medical alert card or wear a medical alert bracelet at all times. This will let health care providers know that you have an implanted port in case of an emergency. Get help right away if: You have redness, swelling, or pain at the port site. You have fluid or blood coming from your port site. You have pus or a bad smell coming from the port site. You have a fever. Summary Implanted ports are usually placed in the chest for long-term IV access. Follow instructions from your health care provider about flushing the port and changing bandages (dressings). Take care of the area around your port by avoiding clothing that puts pressure on the area, and by watching for signs of infection. Protect the skin around your port from seat belts. Place a soft pad over your chest if needed. Get help right away if you have a fever or you have redness, swelling, pain, drainage, or a bad smell at the port site. This information is not intended to replace advice given to you by your health care provider. Make sure you discuss any questions you have with your health care provider. Document Revised: 08/06/2020 Document Reviewed: 10/01/2019 Elsevier Patient Education  2022 [employer]  

## 2021-03-05 LAB — T4: T4, Total: 10.2 ug/dL (ref 4.5–12.0)

## 2021-03-11 ENCOUNTER — Inpatient Hospital Stay: Payer: BC Managed Care – PPO

## 2021-03-11 ENCOUNTER — Telehealth: Payer: Self-pay

## 2021-03-11 ENCOUNTER — Encounter: Payer: Self-pay | Admitting: Hematology & Oncology

## 2021-03-11 ENCOUNTER — Inpatient Hospital Stay (HOSPITAL_BASED_OUTPATIENT_CLINIC_OR_DEPARTMENT_OTHER): Payer: BC Managed Care – PPO | Admitting: Hematology & Oncology

## 2021-03-11 ENCOUNTER — Ambulatory Visit: Payer: BC Managed Care – PPO

## 2021-03-11 ENCOUNTER — Other Ambulatory Visit: Payer: Self-pay

## 2021-03-11 ENCOUNTER — Encounter: Payer: Self-pay | Admitting: *Deleted

## 2021-03-11 VITALS — BP 127/45 | HR 90 | Temp 97.9°F | Resp 20 | Ht 72.0 in | Wt 200.0 lb

## 2021-03-11 DIAGNOSIS — D500 Iron deficiency anemia secondary to blood loss (chronic): Secondary | ICD-10-CM

## 2021-03-11 DIAGNOSIS — E038 Other specified hypothyroidism: Secondary | ICD-10-CM

## 2021-03-11 DIAGNOSIS — C160 Malignant neoplasm of cardia: Secondary | ICD-10-CM | POA: Diagnosis not present

## 2021-03-11 LAB — CBC WITH DIFFERENTIAL (CANCER CENTER ONLY)
Abs Immature Granulocytes: 0.06 10*3/uL (ref 0.00–0.07)
Basophils Absolute: 0.1 10*3/uL (ref 0.0–0.1)
Basophils Relative: 1 %
Eosinophils Absolute: 0.4 10*3/uL (ref 0.0–0.5)
Eosinophils Relative: 7 %
HCT: 31.3 % — ABNORMAL LOW (ref 39.0–52.0)
Hemoglobin: 10.4 g/dL — ABNORMAL LOW (ref 13.0–17.0)
Immature Granulocytes: 1 %
Lymphocytes Relative: 17 %
Lymphs Abs: 1 10*3/uL (ref 0.7–4.0)
MCH: 31.7 pg (ref 26.0–34.0)
MCHC: 33.2 g/dL (ref 30.0–36.0)
MCV: 95.4 fL (ref 80.0–100.0)
Monocytes Absolute: 1.2 10*3/uL — ABNORMAL HIGH (ref 0.1–1.0)
Monocytes Relative: 19 %
Neutro Abs: 3.4 10*3/uL (ref 1.7–7.7)
Neutrophils Relative %: 55 %
Platelet Count: 220 10*3/uL (ref 150–400)
RBC: 3.28 MIL/uL — ABNORMAL LOW (ref 4.22–5.81)
RDW: 15.5 % (ref 11.5–15.5)
WBC Count: 6.1 10*3/uL (ref 4.0–10.5)
nRBC: 0 % (ref 0.0–0.2)

## 2021-03-11 LAB — FERRITIN: Ferritin: 61 ng/mL (ref 24–336)

## 2021-03-11 LAB — CMP (CANCER CENTER ONLY)
ALT: 18 U/L (ref 0–44)
AST: 32 U/L (ref 15–41)
Albumin: 3.6 g/dL (ref 3.5–5.0)
Alkaline Phosphatase: 119 U/L (ref 38–126)
Anion gap: 7 (ref 5–15)
BUN: <5 mg/dL — ABNORMAL LOW (ref 8–23)
CO2: 25 mmol/L (ref 22–32)
Calcium: 8.8 mg/dL — ABNORMAL LOW (ref 8.9–10.3)
Chloride: 101 mmol/L (ref 98–111)
Creatinine: 0.87 mg/dL (ref 0.61–1.24)
GFR, Estimated: >60 mL/min (ref >60)
Glucose, Bld: 115 mg/dL — ABNORMAL HIGH (ref 70–99)
Potassium: 3.6 mmol/L (ref 3.5–5.1)
Sodium: 133 mmol/L — ABNORMAL LOW (ref 135–145)
Total Bilirubin: 0.4 mg/dL (ref 0.3–1.2)
Total Protein: 5.6 g/dL — ABNORMAL LOW (ref 6.5–8.1)

## 2021-03-11 LAB — RETICULOCYTES
Immature Retic Fract: 25 % — ABNORMAL HIGH (ref 2.3–15.9)
RBC.: 3.29 MIL/uL — ABNORMAL LOW (ref 4.22–5.81)
Retic Count, Absolute: 129.3 10*3/uL (ref 19.0–186.0)
Retic Ct Pct: 3.9 % — ABNORMAL HIGH (ref 0.4–3.1)

## 2021-03-11 LAB — TSH: TSH: 2.31 u[IU]/mL (ref 0.320–4.118)

## 2021-03-11 LAB — IRON AND TIBC
Iron: 46 ug/dL (ref 45–182)
Saturation Ratios: 12 % — ABNORMAL LOW (ref 17.9–39.5)
TIBC: 373 ug/dL (ref 250–450)
UIBC: 327 ug/dL

## 2021-03-11 MED ORDER — HEPARIN SOD (PORK) LOCK FLUSH 100 UNIT/ML IV SOLN
500.0000 [IU] | Freq: Once | INTRAVENOUS | Status: AC | PRN
  Administered 2021-03-11: 500 [IU]

## 2021-03-11 MED ORDER — SODIUM CHLORIDE 0.9% FLUSH
10.0000 mL | Freq: Once | INTRAVENOUS | Status: AC | PRN
  Administered 2021-03-11: 10 mL

## 2021-03-11 MED ORDER — DRONABINOL 5 MG PO CAPS: 5.0000 mg | ORAL_CAPSULE | Freq: Two times a day (BID) | ORAL | 0 refills | Status: DC

## 2021-03-11 NOTE — Progress Notes (Signed)
Patient is not feeling well today. His labs are stable but he feels very weak.   After seeing Dr Marin Olp the decision was made to hold treatment and give him an additional week off. He may also make changes to the treatment plan.   Oncology Nurse Navigator Documentation  Oncology Nurse Navigator Flowsheets 03/11/2021  Abnormal Finding Date -  Confirmed Diagnosis Date -  Diagnosis Status -  Phase of Treatment -  Chemotherapy Actual Start Date: -  Chemotherapy Expected End Date: -  Navigator Follow Up Date: 04/15/2021  Navigator Follow Up Reason: Follow-up Appointment;Chemotherapy  Navigator Location [facility]  Navigator Encounter Type Treatment;Appt/Treatment Plan Review  Telephone -  Treatment Initiated Date -  Patient Visit Type MedOnc  Treatment Phase Active Tx  Barriers/Navigation Needs Coordination of Care;Education  Education -  Interventions Psycho-Social Support  Acuity Level 2-Minimal Needs (1-2 Barriers Identified)  Coordination of Care -  Education Method -  Support Groups/Services Friends and Family  Time Spent with Patient 30

## 2021-03-11 NOTE — Telephone Encounter (Signed)
Called and informed patient of lab results, patient verbalized understanding and denies any questions or concerns at this time. Message sent to scheduling to call for apt. ? ?

## 2021-03-11 NOTE — Addendum Note (Signed)
Addended by: San Morelle on: 03/11/2021 10:17 AM   Modules accepted: Orders

## 2021-03-11 NOTE — Progress Notes (Signed)
No treatment today per order of Dr. Ennever.  

## 2021-03-11 NOTE — Patient Instructions (Signed)
Implanted Port Home Guide An implanted port is a device that is placed under the skin. It is usually placed in the chest. The device can be used to give IV medicine, to take blood, or for dialysis. You may have an implanted port if: You need IV medicine that would be irritating to the small veins in your hands or arms. You need IV medicines, such as antibiotics, for a long period of time. You need IV nutrition for a long period of time. You need dialysis. When you have a port, your health care provider can choose to use the port instead of veins in your arms for these procedures. You may have fewer limitations when using a port than you would if you used other types of long-term IVs, and you will likely be able to return to normal activities after your incision heals. An implanted port has two main parts: Reservoir. The reservoir is the part where a needle is inserted to give medicines or draw blood. The reservoir is round. After it is placed, it appears as a small, raised area under your skin. Catheter. The catheter is a thin, flexible tube that connects the reservoir to a vein. Medicine that is inserted into the reservoir goes into the catheter and then into the vein. How is my port accessed? To access your port: A numbing cream may be placed on the skin over the port site. Your health care provider will put on a mask and sterile gloves. The skin over your port will be cleaned carefully with a germ-killing soap and allowed to dry. Your health care provider will gently pinch the port and insert a needle into it. Your health care provider will check for a blood return to make sure the port is in the vein and is not clogged. If your port needs to remain accessed to get medicine continuously (constant infusion), your health care provider will place a clear bandage (dressing) over the needle site. The dressing and needle will need to be changed every week, or as told by your health care provider. What  is flushing? Flushing helps keep the port from getting clogged. Follow instructions from your health care provider about how and when to flush the port. Ports are usually flushed with saline solution or a medicine called heparin. The need for flushing will depend on how the port is used: If the port is only used from time to time to give medicines or draw blood, the port may need to be flushed: Before and after medicines have been given. Before and after blood has been drawn. As part of routine maintenance. Flushing may be recommended every 4-6 weeks. If a constant infusion is running, the port may not need to be flushed. Throw away any syringes in a disposal container that is meant for sharp items (sharps container). You can buy a sharps container from a pharmacy, or you can make one by using an empty hard plastic bottle with a cover. How long will my port stay implanted? The port can stay in for as long as your health care provider thinks it is needed. When it is time for the port to come out, a surgery will be done to remove it. The surgery will be similar to the procedure that was done to put the port in. Follow these instructions at home:  Flush your port as told by your health care provider. If you need an infusion over several days, follow instructions from your health care provider about how   to take care of your port site. Make sure you: Wash your hands with soap and water before you change your dressing. If soap and water are not available, use alcohol-based hand sanitizer. Change your dressing as told by your health care provider. Place any used dressings or infusion bags into a plastic bag. Throw that bag in the trash. Keep the dressing that covers the needle clean and dry. Do not get it wet. Do not use scissors or sharp objects near the tube. Keep the tube clamped, unless it is being used. Check your port site every day for signs of infection. Check for: Redness, swelling, or  pain. Fluid or blood. Pus or a bad smell. Protect the skin around the port site. Avoid wearing bra straps that rub or irritate the site. Protect the skin around your port from seat belts. Place a soft pad over your chest if needed. Bathe or shower as told by your health care provider. The site may get wet as long as you are not actively receiving an infusion. Return to your normal activities as told by your health care provider. Ask your health care provider what activities are safe for you. Carry a medical alert card or wear a medical alert bracelet at all times. This will let health care providers know that you have an implanted port in case of an emergency. Get help right away if: You have redness, swelling, or pain at the port site. You have fluid or blood coming from your port site. You have pus or a bad smell coming from the port site. You have a fever. Summary Implanted ports are usually placed in the chest for long-term IV access. Follow instructions from your health care provider about flushing the port and changing bandages (dressings). Take care of the area around your port by avoiding clothing that puts pressure on the area, and by watching for signs of infection. Protect the skin around your port from seat belts. Place a soft pad over your chest if needed. Get help right away if you have a fever or you have redness, swelling, pain, drainage, or a bad smell at the port site. This information is not intended to replace advice given to you by your health care provider. Make sure you discuss any questions you have with your health care provider. Document Revised: 08/06/2020 Document Reviewed: 10/01/2019 Elsevier Patient Education  2022 [employer]  

## 2021-03-11 NOTE — Progress Notes (Signed)
Hematology and Oncology Follow Up Visit  Eddie Hernandez [id number] [birth year] 65 y.o. 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     Interim History:  Eddie Hernandez is back for treatment.  Unfortunately, he is just having a tough time with treatment.  Even though he has responded nicely, he just is miserable after treatment.  He is weak.  He has no stamina.  His quality of life is just not that great right now.  Is not eating much.  He has no appetite.  I will try him on some Marinol (5 mg p.o. twice daily) to see if this may help.  I would not have to drop the oxaliplatin.  I really think that this might be the source of the issues that he is having.  He is not can get treated today.  Again, I really think that we will have to make an adjustment to his protocol.  There has been no fever.  He has had no obvious bleeding.  He has had no cough.  He has had no leg swelling.  He gets IV fluids on occasion.  Currently, I would have to say that his performance status is ECOG 2.     Medications:  Current Outpatient Medications:    ACCU-CHEK GUIDE test strip, TEST UPTO 4 TIMES A DAY, Disp: 100 strip, Rfl: 11   acetaminophen (TYLENOL) 500 MG tablet, Take 1,000 mg by mouth every 6 (six) hours as needed., Disp: , Rfl:    AMBULATORY NON FORMULARY MEDICATION, Medication Name: Glucometer and strips to test up to once a day. Dx diabetes, Disp: 1 Units, Rfl: 0   ARIPiprazole (ABILIFY) 5 MG tablet, TAKE ONE TABLET (5 MG TOTAL) BY MOUTH DAILY, Disp: 30 tablet, Rfl: 3   atorvastatin (LIPITOR) 40 MG tablet, Take 1 tablet (40 mg total) by mouth at bedtime. Needs labs, Disp: 90 tablet, Rfl: 3   baclofen (LIORESAL) 10 MG tablet, Take 0.5 tablets  (5 mg total) by mouth 3 (three) times daily., Disp: 30 each, Rfl: 0   blood glucose meter kit and supplies, Dispense based on patient and insurance preference. Use up to four times daily as directed. 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 Start the day after chemotherapy for 2 days. Take with food., Disp: 30 tablet, Rfl: 1   dicyclomine (BENTYL) 10 MG capsule, TAKE 1 CAPSULE (10 MG TOTAL) BY MOUTH 3 (THREE) TIMES DAILY BEFORE MEALS., Disp: 270 capsule, Rfl: 1   diphenoxylate-atropine (LOMOTIL) 2.5-0.025 MG tablet, TAKE 2 TABLETS BY MOUTH 4 (FOUR) TIMES DAILY AS NEEDED FOR DIARRHEA OR LOOSE STOOLS., Disp: 100 tablet, Rfl: 0   doxycycline (VIBRAMYCIN) 100 MG capsule, Take 1 capsule (100 mg total) by mouth 2 (two) times daily., Disp: 20 capsule, Rfl: 0   dronabinol (MARINOL) 5 MG capsule, Take 1 capsule (5 mg total) by mouth 2 (two) times daily before lunch and supper., Disp: 60 capsule, Rfl: 0   DULoxetine (CYMBALTA) 60 MG capsule, Take 60 mg by mouth daily., Disp: , Rfl:    eszopiclone (LUNESTA) 2 MG TABS tablet,  Take 1 tablet (2 mg total) by mouth at bedtime as needed., Disp: 30 tablet, Rfl: 5   KLOR-CON M20 20 MEQ tablet, TAKE 2 TABLETS BY MOUTH 2 TIMES DAILY., Disp: 360 tablet, Rfl: 1   levothyroxine (SYNTHROID) 150 MCG tablet, TAKE 1 TABLET BY MOUTH DAILY BEFORE BREAKFAST., Disp: 90 tablet, Rfl: 1   lidocaine-prilocaine (EMLA) cream, Apply to affected area once, Disp: 30 g, Rfl: 3   lisinopril (ZESTRIL) 20 MG tablet, Take 20 mg by mouth daily., Disp: , Rfl:    lubiprostone (AMITIZA) 8 MCG capsule, TAKE 1 CAPSULE (8 MCG TOTAL) BY MOUTH 2 (TWO) TIMES DAILY WITH A MEAL., Disp: 180 capsule, Rfl: 2   NUVIGIL 250 MG tablet, Take 250 mg by mouth daily., Disp: , Rfl:    ondansetron (ZOFRAN) 8 MG tablet, Take 1 tablet (8 mg total) by  mouth 2 (two) times daily as needed for refractory nausea / vomiting. Start on day 3 after chemotherapy., Disp: 30 tablet, Rfl: 1   pantoprazole (PROTONIX) 40 MG tablet, Take 1 tablet by mouth daily., Disp: , Rfl:    pentoxifylline (TRENTAL) 400 MG CR tablet, Take by mouth., Disp: , Rfl:    prednisoLONE acetate (PRED FORTE) 1 % ophthalmic suspension, 1 drop 2 (two) times daily., Disp: , Rfl:    pregabalin (LYRICA) 200 MG capsule, Take 1 capsule by mouth 3 (three) times daily., Disp: , Rfl:    prochlorperazine (COMPAZINE) 10 MG tablet, Take 1 tablet (10 mg total) by mouth every 6 (six) hours as needed (Nausea or vomiting)., Disp: 30 tablet, Rfl: 1   SODIUM FLUORIDE 5000 PPM 1.1 % PSTE, SMARTSIG:Sparingly Topical Every Night, Disp: , Rfl:   Current Facility-Administered Medications:    dexamethasone (DECADRON) injection 10 mg, 10 mg, Intravenous, Once, Hosey Burmester, Rudell Cobb, MD  Allergies: No Known Allergies  Past Medical History, Surgical history, Social history, and Family History were reviewed and updated.  Review of Systems: Review of Systems  Constitutional: Negative.   HENT:  Negative.    Eyes: Negative.   Respiratory: Negative.    Cardiovascular: Negative.   Gastrointestinal: Negative.   Endocrine: Negative.   Genitourinary: Negative.    Musculoskeletal: Negative.   Skin: Negative.   Neurological: Negative.   Hematological: Negative.   Psychiatric/Behavioral: Negative.     Physical Exam:  height is 6' (1.829 m) and weight is 200 lb (90.7 kg). His oral temperature is 97.9 F (36.6 C). His blood pressure is 127/45 (abnormal) and his pulse is 90. His respiration is 20 and oxygen saturation is 100%.   Wt Readings from Last 3 Encounters:  03/11/21 200 lb (90.7 kg)  02/18/21 201 lb (91.2 kg)  01/28/21 199 lb 1.9 oz (90.3 kg)    Physical Exam Vitals reviewed.  HENT:     Head: Normocephalic and atraumatic.  Eyes:     Pupils: Pupils are equal, round, and reactive to light.   Cardiovascular:     Rate and Rhythm: Normal rate and regular rhythm.     Heart sounds: Normal heart sounds.  Pulmonary:     Effort: Pulmonary effort is normal.     Breath sounds: Normal breath sounds.  Abdominal:     General: Bowel sounds are normal.     Palpations: Abdomen is soft.  Musculoskeletal:        General: No tenderness or deformity. Normal range of motion.     Cervical back: Normal range of motion.  Lymphadenopathy:     Cervical: No cervical adenopathy.  Skin:  General: Skin is warm and dry.     Findings: No erythema or rash.  Neurological:     Mental Status: He is alert and oriented to person, place, and time.  Psychiatric:        Behavior: Behavior normal.        Thought Content: Thought content normal.        Judgment: Judgment normal.     Lab Results  Component Value Date   WBC 6.1 03/11/2021   HGB 10.4 (L) 03/11/2021   HCT 31.3 (L) 03/11/2021   MCV 95.4 03/11/2021   PLT 220 03/11/2021     Chemistry      Component Value Date/Time   NA 133 (L) 03/11/2021 0905   K 3.6 03/11/2021 0905   CL 101 03/11/2021 0905   CO2 25 03/11/2021 0905   BUN <5 (L) 03/11/2021 0905   CREATININE 0.87 03/11/2021 0905   CREATININE 0.96 09/05/2020 0000      Component Value Date/Time   CALCIUM 8.8 (L) 03/11/2021 0905   ALKPHOS 119 03/11/2021 0905   AST 32 03/11/2021 0905   ALT 18 03/11/2021 0905   BILITOT 0.4 03/11/2021 0905      Impression and Plan: Eddie Hernandez is a very nice 65 year old white male.  He has metastatic adenocarcinoma of the GE junction..  He has birthday yesterday.  I just wish that he would have felt better for his birthday.  Again, we will hold his treatment this week.  Maybe we will be able to do treatment next week.  I went to stop the oxaliplatin.  Hopefully, with the HER2 positivity, the Herceptin and the nivolumab will be helpful.  We will treat him next week.  I will plan to see him back 3 weeks after that.  Hopefully he will be feeling  better.  If not, we may have to make a actual change of protocol.  [name], MD 10/12/20221:54 PM

## 2021-03-11 NOTE — Telephone Encounter (Signed)
-----   Message from Eddie Napoleon, MD sent at 03/11/2021  3:13 PM EDT ----- Please call him and tell him that the iron level is quite low.  He needs IV iron.  His thyroid level is doing fine.  Please set him up with IV iron this week.  Eddie Hernandez

## 2021-03-12 ENCOUNTER — Ambulatory Visit: Payer: BC Managed Care – PPO | Admitting: Hematology & Oncology

## 2021-03-12 ENCOUNTER — Ambulatory Visit: Payer: BC Managed Care – PPO

## 2021-03-12 ENCOUNTER — Other Ambulatory Visit: Payer: BC Managed Care – PPO

## 2021-03-12 ENCOUNTER — Telehealth: Payer: Self-pay

## 2021-03-12 NOTE — Telephone Encounter (Signed)
Spoke with pt who states he has body aches, chills, no appetite, nausea. Pt took his temp earlier and it was 99.3 so he took ES Tylenol and rechecked temp after an hour and it was 98.3. Declined sour throat, congestion, cough, vomiting or diarrhea. Symptoms started when he woke up this AM. Pt tested negative for  Covid. Pt has received the two series moderna vaccines and then 2 boosters. Pt has had 3 bottles of water today and only has emptied bladder once.   Spoke with Dr Marin Olp and he recommends IV Iron x 1 dose unless he is running a fever. Pt is already scheduled for tomorrow @ 1030. Advised pt to call in if running a fever or feeling too bad to come in. Pt agreed.

## 2021-03-13 ENCOUNTER — Other Ambulatory Visit: Payer: Self-pay | Admitting: *Deleted

## 2021-03-13 ENCOUNTER — Inpatient Hospital Stay: Payer: BC Managed Care – PPO

## 2021-03-13 ENCOUNTER — Other Ambulatory Visit: Payer: Self-pay

## 2021-03-13 VITALS — BP 95/62 | HR 100 | Temp 97.5°F | Resp 18

## 2021-03-13 DIAGNOSIS — C169 Malignant neoplasm of stomach, unspecified: Secondary | ICD-10-CM

## 2021-03-13 DIAGNOSIS — C160 Malignant neoplasm of cardia: Secondary | ICD-10-CM

## 2021-03-13 DIAGNOSIS — D500 Iron deficiency anemia secondary to blood loss (chronic): Secondary | ICD-10-CM

## 2021-03-13 MED ORDER — HEPARIN SOD (PORK) LOCK FLUSH 100 UNIT/ML IV SOLN
500.0000 [IU] | Freq: Once | INTRAVENOUS | Status: AC | PRN
  Administered 2021-03-13: 500 [IU]

## 2021-03-13 MED ORDER — SODIUM CHLORIDE 0.9% FLUSH: 10.0000 mL | Freq: Once | INTRAVENOUS | Status: DC | PRN

## 2021-03-13 MED ORDER — SODIUM CHLORIDE 0.9% FLUSH: 3.0000 mL | Freq: Once | INTRAVENOUS | Status: DC | PRN

## 2021-03-13 MED ORDER — SODIUM CHLORIDE 0.9 % IV SOLN
200.0000 mg | Freq: Once | INTRAVENOUS | Status: AC
  Administered 2021-03-13: 200 mg via INTRAVENOUS
  Filled 2021-03-13: qty 200

## 2021-03-13 MED ORDER — ALTEPLASE 2 MG IJ SOLR: 2.0000 mg | Freq: Once | INTRAMUSCULAR | Status: DC | PRN

## 2021-03-13 MED ORDER — HEPARIN SOD (PORK) LOCK FLUSH 100 UNIT/ML IV SOLN: 250.0000 [IU] | Freq: Once | INTRAVENOUS | Status: DC | PRN

## 2021-03-13 MED ORDER — HEPARIN SOD (PORK) LOCK FLUSH 100 UNIT/ML IV SOLN: 500.0000 [IU] | Freq: Once | INTRAVENOUS | Status: DC | PRN

## 2021-03-13 MED ORDER — SODIUM CHLORIDE 0.9 % IV SOLN: Freq: Once | INTRAVENOUS | Status: AC

## 2021-03-13 MED ORDER — SODIUM CHLORIDE 0.9% FLUSH
10.0000 mL | Freq: Once | INTRAVENOUS | Status: AC | PRN
  Administered 2021-03-13: 10 mL

## 2021-03-13 MED ORDER — SODIUM CHLORIDE 0.9 % IV SOLN: Freq: Once | INTRAVENOUS | Status: DC

## 2021-03-13 NOTE — Patient Instructions (Signed)

## 2021-03-16 ENCOUNTER — Other Ambulatory Visit: Payer: Self-pay | Admitting: *Deleted

## 2021-03-16 ENCOUNTER — Encounter: Payer: Self-pay | Admitting: *Deleted

## 2021-03-16 ENCOUNTER — Inpatient Hospital Stay: Payer: BC Managed Care – PPO

## 2021-03-16 ENCOUNTER — Other Ambulatory Visit: Payer: Self-pay

## 2021-03-16 VITALS — BP 116/36 | HR 84 | Temp 98.7°F | Resp 17

## 2021-03-16 DIAGNOSIS — D500 Iron deficiency anemia secondary to blood loss (chronic): Secondary | ICD-10-CM

## 2021-03-16 DIAGNOSIS — C169 Malignant neoplasm of stomach, unspecified: Secondary | ICD-10-CM

## 2021-03-16 DIAGNOSIS — C160 Malignant neoplasm of cardia: Secondary | ICD-10-CM | POA: Diagnosis not present

## 2021-03-16 DIAGNOSIS — R197 Diarrhea, unspecified: Secondary | ICD-10-CM

## 2021-03-16 DIAGNOSIS — R112 Nausea with vomiting, unspecified: Secondary | ICD-10-CM

## 2021-03-16 DIAGNOSIS — R110 Nausea: Secondary | ICD-10-CM

## 2021-03-16 MED ORDER — KETOROLAC TROMETHAMINE 15 MG/ML IJ SOLN
30.0000 mg | Freq: Once | INTRAMUSCULAR | Status: AC
  Administered 2021-03-16: 30 mg via INTRAVENOUS
  Filled 2021-03-16: qty 2

## 2021-03-16 MED ORDER — HEPARIN SOD (PORK) LOCK FLUSH 100 UNIT/ML IV SOLN
500.0000 [IU] | Freq: Once | INTRAVENOUS | Status: AC | PRN
  Administered 2021-03-16: 500 [IU]

## 2021-03-16 MED ORDER — ONDANSETRON HCL 4 MG/2ML IJ SOLN: 8.0000 mg | Freq: Once | INTRAMUSCULAR | Status: DC

## 2021-03-16 MED ORDER — DIPHENOXYLATE-ATROPINE 2.5-0.025 MG PO TABS
2.0000 | ORAL_TABLET | Freq: Once | ORAL | Status: AC
  Administered 2021-03-16: 2 via ORAL
  Filled 2021-03-16: qty 2

## 2021-03-16 MED ORDER — LORAZEPAM 2 MG/ML IJ SOLN
0.5000 mg | Freq: Once | INTRAMUSCULAR | Status: AC
  Administered 2021-03-16: 0.5 mg via INTRAVENOUS
  Filled 2021-03-16: qty 1

## 2021-03-16 MED ORDER — SODIUM CHLORIDE 0.9% FLUSH
10.0000 mL | Freq: Once | INTRAVENOUS | Status: AC | PRN
  Administered 2021-03-16: 10 mL

## 2021-03-16 MED ORDER — SODIUM CHLORIDE 0.9 % IV SOLN: Freq: Once | INTRAVENOUS | Status: DC

## 2021-03-16 MED ORDER — SODIUM CHLORIDE 0.9 % IV SOLN: Freq: Once | INTRAVENOUS | Status: AC

## 2021-03-16 MED ORDER — SODIUM CHLORIDE 0.9 % IV SOLN: 8.0000 mg | Freq: Once | INTRAVENOUS | Status: DC

## 2021-03-16 NOTE — Progress Notes (Signed)
I informed the patient that he had an ECHO scheduled for Monday, October 24th, at [place], at [facility]. He needs to go to the main entrance and then to admitting. He verbalized understanding.

## 2021-03-16 NOTE — Addendum Note (Signed)
Addended by: Carolynne Edouard B on: 03/16/2021 12:10 PM   Modules accepted: Orders

## 2021-03-16 NOTE — Patient Instructions (Signed)

## 2021-03-18 ENCOUNTER — Inpatient Hospital Stay: Payer: BC Managed Care – PPO

## 2021-03-18 ENCOUNTER — Other Ambulatory Visit: Payer: Self-pay

## 2021-03-18 DIAGNOSIS — C160 Malignant neoplasm of cardia: Secondary | ICD-10-CM

## 2021-03-18 DIAGNOSIS — Z95828 Presence of other vascular implants and grafts: Secondary | ICD-10-CM

## 2021-03-18 DIAGNOSIS — D500 Iron deficiency anemia secondary to blood loss (chronic): Secondary | ICD-10-CM

## 2021-03-18 LAB — CMP (CANCER CENTER ONLY)
ALT: 14 U/L (ref 0–44)
AST: 34 U/L (ref 15–41)
Albumin: 3.4 g/dL — ABNORMAL LOW (ref 3.5–5.0)
Alkaline Phosphatase: 170 U/L — ABNORMAL HIGH (ref 38–126)
Anion gap: 7 (ref 5–15)
BUN: 7 mg/dL — ABNORMAL LOW (ref 8–23)
CO2: 23 mmol/L (ref 22–32)
Calcium: 8.7 mg/dL — ABNORMAL LOW (ref 8.9–10.3)
Chloride: 100 mmol/L (ref 98–111)
Creatinine: 0.83 mg/dL (ref 0.61–1.24)
GFR, Estimated: >60 mL/min (ref >60)
Glucose, Bld: 143 mg/dL — ABNORMAL HIGH (ref 70–99)
Potassium: 3.5 mmol/L (ref 3.5–5.1)
Sodium: 130 mmol/L — ABNORMAL LOW (ref 135–145)
Total Bilirubin: 0.6 mg/dL (ref 0.3–1.2)
Total Protein: 5.9 g/dL — ABNORMAL LOW (ref 6.5–8.1)

## 2021-03-18 LAB — CBC WITH DIFFERENTIAL (CANCER CENTER ONLY)
Abs Immature Granulocytes: 0.03 10*3/uL (ref 0.00–0.07)
Basophils Absolute: 0.1 10*3/uL (ref 0.0–0.1)
Basophils Relative: 1 %
Eosinophils Absolute: 0.3 10*3/uL (ref 0.0–0.5)
Eosinophils Relative: 7 %
HCT: 30.7 % — ABNORMAL LOW (ref 39.0–52.0)
Hemoglobin: 10.4 g/dL — ABNORMAL LOW (ref 13.0–17.0)
Immature Granulocytes: 1 %
Lymphocytes Relative: 16 %
Lymphs Abs: 0.8 10*3/uL (ref 0.7–4.0)
MCH: 31.6 pg (ref 26.0–34.0)
MCHC: 33.9 g/dL (ref 30.0–36.0)
MCV: 93.3 fL (ref 80.0–100.0)
Monocytes Absolute: 1 10*3/uL (ref 0.1–1.0)
Monocytes Relative: 23 %
Neutro Abs: 2.4 10*3/uL (ref 1.7–7.7)
Neutrophils Relative %: 52 %
Platelet Count: 239 10*3/uL (ref 150–400)
RBC: 3.29 MIL/uL — ABNORMAL LOW (ref 4.22–5.81)
RDW: 14.9 % (ref 11.5–15.5)
WBC Count: 4.6 10*3/uL (ref 4.0–10.5)
nRBC: 0 % (ref 0.0–0.2)

## 2021-03-18 LAB — TSH: TSH: 2.299 u[IU]/mL (ref 0.320–4.118)

## 2021-03-18 MED ORDER — HEPARIN SOD (PORK) LOCK FLUSH 100 UNIT/ML IV SOLN
500.0000 [IU] | Freq: Once | INTRAVENOUS | Status: AC
Start: 2021-03-18 — End: 2021-03-18
  Administered 2021-03-18: 500 [IU] via INTRAVENOUS

## 2021-03-18 MED ORDER — SODIUM CHLORIDE 0.9% FLUSH
10.0000 mL | Freq: Once | INTRAVENOUS | Status: AC
  Administered 2021-03-18: 10 mL via INTRAVENOUS

## 2021-03-18 MED ORDER — SODIUM CHLORIDE 0.9 % IV SOLN
Freq: Once | INTRAVENOUS | Status: AC
Start: 2021-03-18 — End: 2021-03-18

## 2021-03-18 NOTE — Progress Notes (Signed)
Reviewed labs with patient. Pt c/o severe fatigue and wanting to delay treatment by one week to gain strength. Pt aware his labs are eligible for treatment. Reviewed labs and pt request with Dr. Marin Olp who agrees to delay treatment for one week. Pt to receive IVF today and return in one week for labs and treatment after seeing Lottie Dawson NP. Pt aware of plan and agreeable. Pt aware that if he begins to have any problems to call clinic. Pt verbalized understanding and had no further questions.

## 2021-03-18 NOTE — Patient Instructions (Signed)

## 2021-03-18 NOTE — Patient Instructions (Signed)

## 2021-03-19 LAB — T4: T4, Total: 12.1 ug/dL — ABNORMAL HIGH (ref 4.5–12.0)

## 2021-03-20 ENCOUNTER — Other Ambulatory Visit (HOSPITAL_COMMUNITY): Payer: BC Managed Care – PPO

## 2021-03-20 ENCOUNTER — Inpatient Hospital Stay: Payer: BC Managed Care – PPO

## 2021-03-22 ENCOUNTER — Other Ambulatory Visit: Payer: Self-pay | Admitting: Hematology & Oncology

## 2021-03-22 DIAGNOSIS — K589 Irritable bowel syndrome without diarrhea: Secondary | ICD-10-CM

## 2021-03-23 ENCOUNTER — Ambulatory Visit (HOSPITAL_COMMUNITY)
Admission: RE | Admit: 2021-03-23 | Discharge: 2021-03-23 | Disposition: A | Discharge status: Home / Self Care | Payer: BC Managed Care – PPO | Source: Ambulatory Visit | Attending: Hematology & Oncology | Admitting: Hematology & Oncology

## 2021-03-23 ENCOUNTER — Encounter: Payer: Self-pay | Admitting: Hematology & Oncology

## 2021-03-23 DIAGNOSIS — Z0189 Encounter for other specified special examinations: Secondary | ICD-10-CM | POA: Diagnosis not present

## 2021-03-23 DIAGNOSIS — C169 Malignant neoplasm of stomach, unspecified: Secondary | ICD-10-CM | POA: Diagnosis not present

## 2021-03-23 DIAGNOSIS — Z01810 Encounter for preprocedural cardiovascular examination: Secondary | ICD-10-CM | POA: Diagnosis present

## 2021-03-23 DIAGNOSIS — E039 Hypothyroidism, unspecified: Secondary | ICD-10-CM | POA: Diagnosis not present

## 2021-03-23 DIAGNOSIS — E785 Hyperlipidemia, unspecified: Secondary | ICD-10-CM | POA: Diagnosis not present

## 2021-03-23 DIAGNOSIS — D500 Iron deficiency anemia secondary to blood loss (chronic): Secondary | ICD-10-CM | POA: Diagnosis not present

## 2021-03-23 DIAGNOSIS — C160 Malignant neoplasm of cardia: Secondary | ICD-10-CM

## 2021-03-23 DIAGNOSIS — I10 Essential (primary) hypertension: Secondary | ICD-10-CM | POA: Diagnosis not present

## 2021-03-23 LAB — ECHOCARDIOGRAM LIMITED
Area-P 1/2: 3.95 cm2
Calc EF: 41.6 %
S' Lateral: 3.9 cm
Single Plane A2C EF: 39.7 %
Single Plane A4C EF: 40.9 %

## 2021-03-23 NOTE — Telephone Encounter (Signed)
Refill request received for Lomotil. Last OV 03/11/21 and last refilled 01/21/21 #100. Please advise, thank you.

## 2021-03-23 NOTE — Progress Notes (Signed)
Echocardiogram 2D Echocardiogram has been performed.  Eddie Hernandez M 03/23/2021, 9:55 AM

## 2021-03-24 ENCOUNTER — Telehealth: Payer: Self-pay

## 2021-03-24 ENCOUNTER — Encounter: Payer: Self-pay | Admitting: Hematology & Oncology

## 2021-03-24 NOTE — Telephone Encounter (Signed)
Responded via MyChart.

## 2021-03-24 NOTE — Telephone Encounter (Signed)
-----   Message from [name], MD sent at 03/24/2021 10:34 AM EDT ----- Call - the heart is pumping like a champ!!!  Eddie Hernandez

## 2021-03-25 ENCOUNTER — Inpatient Hospital Stay: Payer: BC Managed Care – PPO

## 2021-03-25 ENCOUNTER — Other Ambulatory Visit: Payer: Self-pay

## 2021-03-25 ENCOUNTER — Encounter: Payer: Self-pay | Admitting: *Deleted

## 2021-03-25 ENCOUNTER — Encounter: Payer: Self-pay | Admitting: Family

## 2021-03-25 ENCOUNTER — Telehealth: Payer: Self-pay | Admitting: *Deleted

## 2021-03-25 ENCOUNTER — Inpatient Hospital Stay (HOSPITAL_BASED_OUTPATIENT_CLINIC_OR_DEPARTMENT_OTHER): Payer: BC Managed Care – PPO | Admitting: Family

## 2021-03-25 VITALS — BP 120/55 | HR 110 | Resp 16

## 2021-03-25 VITALS — BP 117/54 | HR 139 | Resp 16 | Wt 183.1 lb

## 2021-03-25 DIAGNOSIS — D500 Iron deficiency anemia secondary to blood loss (chronic): Secondary | ICD-10-CM

## 2021-03-25 DIAGNOSIS — C160 Malignant neoplasm of cardia: Secondary | ICD-10-CM | POA: Diagnosis not present

## 2021-03-25 DIAGNOSIS — R110 Nausea: Secondary | ICD-10-CM

## 2021-03-25 DIAGNOSIS — E038 Other specified hypothyroidism: Secondary | ICD-10-CM

## 2021-03-25 DIAGNOSIS — E032 Hypothyroidism due to medicaments and other exogenous substances: Secondary | ICD-10-CM

## 2021-03-25 DIAGNOSIS — R634 Abnormal weight loss: Secondary | ICD-10-CM | POA: Diagnosis not present

## 2021-03-25 LAB — CBC WITH DIFFERENTIAL (CANCER CENTER ONLY)
Abs Immature Granulocytes: 0.04 10*3/uL (ref 0.00–0.07)
Basophils Absolute: 0.1 10*3/uL (ref 0.0–0.1)
Basophils Relative: 1 %
Eosinophils Absolute: 1 10*3/uL — ABNORMAL HIGH (ref 0.0–0.5)
Eosinophils Relative: 13 %
HCT: 33.9 % — ABNORMAL LOW (ref 39.0–52.0)
Hemoglobin: 11.2 g/dL — ABNORMAL LOW (ref 13.0–17.0)
Immature Granulocytes: 1 %
Lymphocytes Relative: 13 %
Lymphs Abs: 1 10*3/uL (ref 0.7–4.0)
MCH: 31 pg (ref 26.0–34.0)
MCHC: 33 g/dL (ref 30.0–36.0)
MCV: 93.9 fL (ref 80.0–100.0)
Monocytes Absolute: 1 10*3/uL (ref 0.1–1.0)
Monocytes Relative: 13 %
Neutro Abs: 4.5 10*3/uL (ref 1.7–7.7)
Neutrophils Relative %: 59 %
Platelet Count: 296 10*3/uL (ref 150–400)
RBC: 3.61 MIL/uL — ABNORMAL LOW (ref 4.22–5.81)
RDW: 14.6 % (ref 11.5–15.5)
WBC Count: 7.7 10*3/uL (ref 4.0–10.5)
nRBC: 0 % (ref 0.0–0.2)

## 2021-03-25 LAB — CMP (CANCER CENTER ONLY)
ALT: 13 U/L (ref 0–44)
AST: 37 U/L (ref 15–41)
Albumin: 3.7 g/dL (ref 3.5–5.0)
Alkaline Phosphatase: 218 U/L — ABNORMAL HIGH (ref 38–126)
Anion gap: 9 (ref 5–15)
BUN: 6 mg/dL — ABNORMAL LOW (ref 8–23)
CO2: 24 mmol/L (ref 22–32)
Calcium: 9.5 mg/dL (ref 8.9–10.3)
Chloride: 99 mmol/L (ref 98–111)
Creatinine: 0.85 mg/dL (ref 0.61–1.24)
GFR, Estimated: >60 mL/min (ref >60)
Glucose, Bld: 104 mg/dL — ABNORMAL HIGH (ref 70–99)
Potassium: 4 mmol/L (ref 3.5–5.1)
Sodium: 132 mmol/L — ABNORMAL LOW (ref 135–145)
Total Bilirubin: 0.6 mg/dL (ref 0.3–1.2)
Total Protein: 6.4 g/dL — ABNORMAL LOW (ref 6.5–8.1)

## 2021-03-25 LAB — IRON AND TIBC
Iron: 51 ug/dL (ref 42–163)
Saturation Ratios: 19 % — ABNORMAL LOW (ref 20–55)
TIBC: 274 ug/dL (ref 202–409)
UIBC: 223 ug/dL (ref 117–376)

## 2021-03-25 LAB — FERRITIN: Ferritin: 260 ng/mL (ref 24–336)

## 2021-03-25 LAB — RETICULOCYTES
Immature Retic Fract: 18.1 % — ABNORMAL HIGH (ref 2.3–15.9)
RBC.: 3.56 MIL/uL — ABNORMAL LOW (ref 4.22–5.81)
Retic Count, Absolute: 113.6 10*3/uL (ref 19.0–186.0)
Retic Ct Pct: 3.2 % — ABNORMAL HIGH (ref 0.4–3.1)

## 2021-03-25 LAB — TSH: TSH: 3.553 u[IU]/mL (ref 0.320–4.118)

## 2021-03-25 MED ORDER — SODIUM CHLORIDE 0.9 % IV SOLN
10.0000 mg | Freq: Once | INTRAVENOUS | Status: AC
  Administered 2021-03-25: 10 mg via INTRAVENOUS
  Filled 2021-03-25: qty 10

## 2021-03-25 MED ORDER — LORAZEPAM 0.5 MG PO TABS: 0.5000 mg | ORAL_TABLET | Freq: Two times a day (BID) | ORAL | 0 refills | Status: AC | PRN

## 2021-03-25 MED ORDER — DEXAMETHASONE 4 MG PO TABS: 4.0000 mg | ORAL_TABLET | Freq: Two times a day (BID) | ORAL | 1 refills | Status: AC

## 2021-03-25 MED ORDER — PALONOSETRON HCL INJECTION 0.25 MG/5ML
0.2500 mg | Freq: Once | INTRAVENOUS | Status: AC
  Administered 2021-03-25: 0.25 mg via INTRAVENOUS
  Filled 2021-03-25: qty 5

## 2021-03-25 MED ORDER — SODIUM CHLORIDE 0.9 % IV SOLN: Freq: Once | INTRAVENOUS | Status: AC

## 2021-03-25 NOTE — Progress Notes (Signed)
Patient continues to feel poorly. Treatment will continue to be held. Will have him return next week to attempt treatment.   Oncology Nurse Navigator Documentation  Oncology Nurse Navigator Flowsheets 03/25/2021  Abnormal Finding Date -  Confirmed Diagnosis Date -  Diagnosis Status -  Phase of Treatment -  Chemotherapy Actual Start Date: -  Chemotherapy Expected End Date: -  Navigator Follow Up Date: 04/03/2021  Navigator Follow Up Reason: Follow-up Appointment;Chemotherapy  Navigator Restaurant manager, [employer] food Encounter Type Appt/Treatment Plan Review  Telephone -  Treatment Initiated Date -  Patient Visit Type MedOnc  Treatment Phase Active Tx  Barriers/Navigation Needs Coordination of Care;Education  Education -  Interventions None Required  Acuity Level 2-Minimal Needs (1-2 Barriers Identified)  Coordination of Care -  Education Method -  Support Groups/Services Friends and Family  Time Spent with Patient 15

## 2021-03-25 NOTE — Patient Instructions (Signed)

## 2021-03-25 NOTE — Progress Notes (Signed)
Hematology and Oncology Follow Up Visit  Eddie Hernandez [id number] 04-18-1956 65 y.o. 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   Interim History:  Eddie Hernandez is here today with his wife for follow-up and treatment.  He states that he feels worse this week than he did last week. He is is fatigued, has no appetite and has bouts of nausea without vomiting despite taking the antiemetics.  He had 1 day of diarrhea last week where he had 4-5 episodes of loose stool. This resolved once he took Lomotil. He has had some intermittent lower left back and pelvic pain. This comes and goes.  He notes occasional dizziness with poor balance. Thankfully he has not had any falls or syncopal episodes. He uses a Corporate investment banker when ambulating for added support.  He has mild SOB with exertion and takes a break to rest as needed.  He has had some issues with brain fog and short term memory recall.  He has neuropathy in his feet. He also states that he has a muscle twitching his arms and legs which is not new.  He had 1 episodes of left chest discomfort a few weeks ago. None since then. His ECHO this week showed an EF of 55-60%.  As mentioned above, he has no appetite and has not been hydrating well at home. He had been taking the Marinol off and on. His weight is down 11 lbs since his last visit at 183 lbs.  No fever, chills, cough, rash, palpitations or changes in bladder habits.   ECOG Performance Status: 1 - Symptomatic but completely ambulatory  Medications:  Allergies as of 03/25/2021   No Known Allergies      Medication List        Accurate as of March 25, 2021  8:22 AM. If you have any questions, ask your nurse or doctor.           Accu-Chek Guide test strip Generic drug: glucose blood TEST UPTO 4 TIMES A DAY   acetaminophen 500 MG tablet Commonly known as: TYLENOL Take 1,000 mg by mouth every 6 (six) hours as needed.   AMBULATORY NON FORMULARY MEDICATION Medication Name: Glucometer and strips to test up to once a day. Dx diabetes   ARIPiprazole 5 MG tablet Commonly known as: ABILIFY TAKE ONE TABLET (5 MG TOTAL) BY MOUTH DAILY   atorvastatin 40 MG tablet Commonly known as: LIPITOR Take 1 tablet (40 mg total) by mouth at bedtime. Needs labs   baclofen 10 MG tablet Commonly known as: LIORESAL Take 0.5 tablets (5 mg total) by mouth 3 (three) times daily.   blood glucose meter kit and supplies Dispense based on patient and insurance preference. Use up to four times daily as directed. DX: E11.8   cilostazol 100 MG tablet Commonly known as: PLETAL Take 100 mg by mouth 2 (two) times daily.   dexamethasone 4 MG tablet Commonly known as: DECADRON Take 2 tablets (8 mg total) by mouth daily. Start the day after chemotherapy for 2 days. Take with food.   dicyclomine 10 MG capsule Commonly known as: BENTYL TAKE 1 CAPSULE (10 MG TOTAL) BY MOUTH 3 (THREE) TIMES DAILY BEFORE MEALS.   diphenoxylate-atropine 2.5-0.025 MG tablet Commonly known as: LOMOTIL TAKE  2 TABLETS BY MOUTH 4 (FOUR) TIMES DAILY AS NEEDED FOR DIARRHEA OR LOOSE STOOLS.   doxycycline 100 MG capsule Commonly known as: VIBRAMYCIN Take 1 capsule (100 mg total) by mouth 2 (two) times daily.   dronabinol 5 MG capsule Commonly known as: MARINOL Take 1 capsule (5 mg total) by mouth 2 (two) times daily before lunch and supper.   DULoxetine 60 MG capsule Commonly known as: CYMBALTA Take 60 mg by mouth daily.   eszopiclone 2 MG Tabs tablet Commonly known as: LUNESTA Take 1 tablet (2 mg total) by mouth at bedtime as needed.   Klor-Con M20 20 MEQ tablet Generic drug: potassium chloride SA TAKE 2 TABLETS BY MOUTH 2 TIMES DAILY.    levothyroxine 150 MCG tablet Commonly known as: SYNTHROID TAKE 1 TABLET BY MOUTH DAILY BEFORE BREAKFAST.   lidocaine-prilocaine cream Commonly known as: EMLA Apply to affected area once   lisinopril 20 MG tablet Commonly known as: ZESTRIL Take 20 mg by mouth daily.   lubiprostone 8 MCG capsule Commonly known as: AMITIZA TAKE 1 CAPSULE (8 MCG TOTAL) BY MOUTH 2 (TWO) TIMES DAILY WITH A MEAL.   Nuvigil 250 MG tablet Generic drug: Armodafinil Take 250 mg by mouth daily.   ondansetron 8 MG tablet Commonly known as: Zofran Take 1 tablet (8 mg total) by mouth 2 (two) times daily as needed for refractory nausea / vomiting. Start on day 3 after chemotherapy.   pantoprazole 40 MG tablet Commonly known as: PROTONIX Take 1 tablet by mouth daily.   pentoxifylline 400 MG CR tablet Commonly known as: TRENTAL Take by mouth.   prednisoLONE acetate 1 % ophthalmic suspension Commonly known as: PRED FORTE 1 drop 2 (two) times daily.   pregabalin 200 MG capsule Commonly known as: LYRICA Take 1 capsule by mouth 3 (three) times daily.   prochlorperazine 10 MG tablet Commonly known as: COMPAZINE Take 1 tablet (10 mg total) by mouth every 6 (six) hours as needed (Nausea or vomiting).   Sodium Fluoride 5000 PPM 1.1 % Pste Generic drug: Sodium Fluoride SMARTSIG:Sparingly Topical Every Night        Allergies: No Known Allergies  Past Medical History, Surgical history, Social history, and Family History were reviewed and updated.  Review of Systems: All other 10 point review of systems is negative.   Physical Exam:  vitals were not taken for this visit.   Wt Readings from Last 3 Encounters:  03/18/21 194 lb 6.4 oz (88.2 kg)  03/11/21 200 lb (90.7 kg)  02/18/21 201 lb (91.2 kg)    Ocular: Sclerae unicteric, pupils equal, round and reactive to light Ear-nose-throat: Oropharynx clear, dentition fair Lymphatic: No cervical or supraclavicular adenopathy Lungs no rales or  rhonchi, good excursion bilaterally Heart regular rate and rhythm, no murmur appreciated Abd soft, nontender, positive bowel sounds MSK no focal spinal tenderness, no joint edema Neuro: non-focal, well-oriented, appropriate affect Breasts: Deferred   Lab Results  Component Value Date   WBC 4.6 03/18/2021   HGB 10.4 (L) 03/18/2021   HCT 30.7 (L) 03/18/2021   MCV 93.3 03/18/2021   PLT 239 03/18/2021   Lab Results  Component Value Date   FERRITIN 61 03/11/2021   IRON 46 03/11/2021   TIBC 373 03/11/2021   UIBC 327 03/11/2021   IRONPCTSAT 12 (L) 03/11/2021   Lab Results  Component Value Date   RETICCTPCT 3.9 (H) 03/11/2021   RBC 3.29 (L) 03/18/2021   No results found for: KPAFRELGTCHN, LAMBDASER, KAPLAMBRATIO No results found for:  IGGSERUM, IGA, IGMSERUM No results found for: TOTALPROTELP, ALBUMINELP, A1GS, A2GS, BETS, BETA2SER, GAMS, MSPIKE, SPEI   Chemistry      Component Value Date/Time   NA 130 (L) 03/18/2021 1034   K 3.5 03/18/2021 1034   CL 100 03/18/2021 1034   CO2 23 03/18/2021 1034   BUN 7 (L) 03/18/2021 1034   CREATININE 0.83 03/18/2021 1034   CREATININE 0.96 09/05/2020 0000      Component Value Date/Time   CALCIUM 8.7 (L) 03/18/2021 1034   ALKPHOS 170 (H) 03/18/2021 1034   AST 34 03/18/2021 1034   ALT 14 03/18/2021 1034   BILITOT 0.6 03/18/2021 1034       Impression and Plan: Eddie Hernandez is a very pleasant 65 yo gentleman with metastatic adenocarcinoma of the GE junction, HER2 positive.  He is still having a rough time with fatigue, loss of appetite, 11 lb weight loss and nausea.  We held treatment today and gave IV fluids, an antiemetic and steroid.  I spoke with Lattie Haw in pharmacy and will send him home on Decadron 4 mg PO BID as well as Ativan 0.5 mg PO every 12 hours as needed for nausea. The patient and his wife verbalized understanding and agreement with the plan. Potential side effects discussed as well.  We will see him back next week to reassess.   They were instructed to contact our office with any questions or concerns and to get to the ED in the event of an emergency.  Greater than 50% of the patient visit was spent counseling and coordinating care.   Lottie Dawson, NP 10/26/20228:22 AM

## 2021-03-25 NOTE — Patient Instructions (Signed)

## 2021-03-25 NOTE — Telephone Encounter (Signed)
Per 03/25/21 los - called and gave upcoming appointments - requesting callback to confirm

## 2021-03-26 ENCOUNTER — Encounter: Payer: Self-pay | Admitting: Hematology & Oncology

## 2021-03-26 LAB — T4: T4, Total: 13.1 ug/dL — ABNORMAL HIGH (ref 4.5–12.0)

## 2021-03-27 ENCOUNTER — Inpatient Hospital Stay: Payer: BC Managed Care – PPO

## 2021-03-31 ENCOUNTER — Inpatient Hospital Stay: Payer: BC Managed Care – PPO

## 2021-03-31 ENCOUNTER — Encounter: Payer: Self-pay | Admitting: Hematology & Oncology

## 2021-03-31 ENCOUNTER — Other Ambulatory Visit: Payer: Self-pay

## 2021-03-31 ENCOUNTER — Ambulatory Visit: Payer: BC Managed Care – PPO

## 2021-03-31 ENCOUNTER — Telehealth: Payer: Self-pay | Admitting: *Deleted

## 2021-03-31 ENCOUNTER — Inpatient Hospital Stay: Payer: BC Managed Care – PPO | Attending: Hematology & Oncology

## 2021-03-31 ENCOUNTER — Inpatient Hospital Stay (HOSPITAL_BASED_OUTPATIENT_CLINIC_OR_DEPARTMENT_OTHER): Payer: BC Managed Care – PPO | Admitting: Hematology & Oncology

## 2021-03-31 VITALS — BP 113/67 | HR 78 | Temp 98.2°F | Resp 18 | Wt 180.2 lb

## 2021-03-31 DIAGNOSIS — Z79899 Other long term (current) drug therapy: Secondary | ICD-10-CM | POA: Insufficient documentation

## 2021-03-31 DIAGNOSIS — Z5189 Encounter for other specified aftercare: Secondary | ICD-10-CM | POA: Insufficient documentation

## 2021-03-31 DIAGNOSIS — C160 Malignant neoplasm of cardia: Secondary | ICD-10-CM | POA: Diagnosis not present

## 2021-03-31 DIAGNOSIS — R634 Abnormal weight loss: Secondary | ICD-10-CM

## 2021-03-31 DIAGNOSIS — T451X5A Adverse effect of antineoplastic and immunosuppressive drugs, initial encounter: Secondary | ICD-10-CM | POA: Diagnosis not present

## 2021-03-31 DIAGNOSIS — D701 Agranulocytosis secondary to cancer chemotherapy: Secondary | ICD-10-CM | POA: Diagnosis not present

## 2021-03-31 DIAGNOSIS — D500 Iron deficiency anemia secondary to blood loss (chronic): Secondary | ICD-10-CM

## 2021-03-31 DIAGNOSIS — Z5111 Encounter for antineoplastic chemotherapy: Secondary | ICD-10-CM | POA: Insufficient documentation

## 2021-03-31 DIAGNOSIS — Z5112 Encounter for antineoplastic immunotherapy: Secondary | ICD-10-CM | POA: Diagnosis not present

## 2021-03-31 DIAGNOSIS — E032 Hypothyroidism due to medicaments and other exogenous substances: Secondary | ICD-10-CM

## 2021-03-31 DIAGNOSIS — R110 Nausea: Secondary | ICD-10-CM

## 2021-03-31 LAB — CMP (CANCER CENTER ONLY)
ALT: 23 U/L (ref 0–44)
AST: 24 U/L (ref 15–41)
Albumin: 4 g/dL (ref 3.5–5.0)
Alkaline Phosphatase: 151 U/L — ABNORMAL HIGH (ref 38–126)
Anion gap: 8 (ref 5–15)
BUN: 19 mg/dL (ref 8–23)
CO2: 27 mmol/L (ref 22–32)
Calcium: 9.4 mg/dL (ref 8.9–10.3)
Chloride: 102 mmol/L (ref 98–111)
Creatinine: 0.9 mg/dL (ref 0.61–1.24)
GFR, Estimated: >60 mL/min (ref >60)
Glucose, Bld: 123 mg/dL — ABNORMAL HIGH (ref 70–99)
Potassium: 3.1 mmol/L — ABNORMAL LOW (ref 3.5–5.1)
Sodium: 137 mmol/L (ref 135–145)
Total Bilirubin: 0.4 mg/dL (ref 0.3–1.2)
Total Protein: 6.3 g/dL — ABNORMAL LOW (ref 6.5–8.1)

## 2021-03-31 LAB — CBC WITH DIFFERENTIAL (CANCER CENTER ONLY)
Abs Immature Granulocytes: 0.14 10*3/uL — ABNORMAL HIGH (ref 0.00–0.07)
Basophils Absolute: 0 10*3/uL (ref 0.0–0.1)
Basophils Relative: 0 %
Eosinophils Absolute: 0.2 10*3/uL (ref 0.0–0.5)
Eosinophils Relative: 2 %
HCT: 33 % — ABNORMAL LOW (ref 39.0–52.0)
Hemoglobin: 10.9 g/dL — ABNORMAL LOW (ref 13.0–17.0)
Immature Granulocytes: 1 %
Lymphocytes Relative: 17 %
Lymphs Abs: 1.8 10*3/uL (ref 0.7–4.0)
MCH: 31.2 pg (ref 26.0–34.0)
MCHC: 33 g/dL (ref 30.0–36.0)
MCV: 94.6 fL (ref 80.0–100.0)
Monocytes Absolute: 1 10*3/uL (ref 0.1–1.0)
Monocytes Relative: 9 %
Neutro Abs: 7.2 10*3/uL (ref 1.7–7.7)
Neutrophils Relative %: 71 %
Platelet Count: 300 10*3/uL (ref 150–400)
RBC: 3.49 MIL/uL — ABNORMAL LOW (ref 4.22–5.81)
RDW: 14.7 % (ref 11.5–15.5)
WBC Count: 10.4 10*3/uL (ref 4.0–10.5)
nRBC: 0 % (ref 0.0–0.2)

## 2021-03-31 LAB — RETICULOCYTES
Immature Retic Fract: 28.6 % — ABNORMAL HIGH (ref 2.3–15.9)
RBC.: 3.44 MIL/uL — ABNORMAL LOW (ref 4.22–5.81)
Retic Count, Absolute: 90.1 10*3/uL (ref 19.0–186.0)
Retic Ct Pct: 2.6 % (ref 0.4–3.1)

## 2021-03-31 LAB — PREALBUMIN: Prealbumin: 24.3 mg/dL (ref 18–38)

## 2021-03-31 LAB — TSH: TSH: 0.456 u[IU]/mL (ref 0.320–4.118)

## 2021-03-31 MED ORDER — NIVOLUMAB CHEMO INJECTION 100 MG/10ML
480.0000 mg | Freq: Once | INTRAVENOUS | Status: DC
  Filled 2021-03-31: qty 48

## 2021-03-31 MED ORDER — DEXTROSE 5 % IV SOLN: Freq: Once | INTRAVENOUS | Status: DC

## 2021-03-31 MED ORDER — TRASTUZUMAB-ANNS CHEMO 150 MG IV SOLR
4.0000 mg/kg | Freq: Once | INTRAVENOUS | Status: AC
  Administered 2021-03-31: 357 mg via INTRAVENOUS
  Filled 2021-03-31: qty 17

## 2021-03-31 MED ORDER — SODIUM CHLORIDE 0.9 % IV SOLN
10.0000 mg | Freq: Once | INTRAVENOUS | Status: AC
  Administered 2021-03-31: 10 mg via INTRAVENOUS
  Filled 2021-03-31: qty 10

## 2021-03-31 MED ORDER — DEXTROSE 5 % IV SOLN: Freq: Once | INTRAVENOUS | Status: AC

## 2021-03-31 MED ORDER — IRON SUCROSE 20 MG/ML IV SOLN
200.0000 mg | Freq: Once | INTRAVENOUS | Status: AC
  Administered 2021-03-31: 200 mg via INTRAVENOUS
  Filled 2021-03-31: qty 200

## 2021-03-31 MED ORDER — FLUOROURACIL CHEMO INJECTION 2.5 GM/50ML
320.0000 mg/m2 | Freq: Once | INTRAVENOUS | Status: AC
  Administered 2021-03-31: 700 mg via INTRAVENOUS
  Filled 2021-03-31: qty 14

## 2021-03-31 MED ORDER — LEUCOVORIN CALCIUM INJECTION 350 MG
400.0000 mg/m2 | Freq: Once | INTRAVENOUS | Status: AC
  Administered 2021-03-31: 864 mg via INTRAVENOUS
  Filled 2021-03-31: qty 43.2

## 2021-03-31 MED ORDER — SODIUM CHLORIDE 0.9 % IV SOLN: Freq: Once | INTRAVENOUS | Status: AC

## 2021-03-31 MED ORDER — DIPHENHYDRAMINE HCL 25 MG PO CAPS
50.0000 mg | ORAL_CAPSULE | Freq: Once | ORAL | Status: AC
  Administered 2021-03-31: 50 mg via ORAL

## 2021-03-31 MED ORDER — SODIUM CHLORIDE 0.9 % IV SOLN: Freq: Once | INTRAVENOUS | Status: DC

## 2021-03-31 MED ORDER — PALONOSETRON HCL INJECTION 0.25 MG/5ML
0.2500 mg | Freq: Once | INTRAVENOUS | Status: AC
  Administered 2021-03-31: 0.25 mg via INTRAVENOUS

## 2021-03-31 MED ORDER — ACETAMINOPHEN 325 MG PO TABS
650.0000 mg | ORAL_TABLET | Freq: Once | ORAL | Status: AC
  Administered 2021-03-31: 650 mg via ORAL

## 2021-03-31 MED ORDER — SODIUM CHLORIDE 0.9 % IV SOLN
1920.0000 mg/m2 | INTRAVENOUS | Status: DC
  Administered 2021-03-31: 4150 mg via INTRAVENOUS
  Filled 2021-03-31: qty 83

## 2021-03-31 NOTE — Telephone Encounter (Signed)
Kn[place] to sched a P2P in that [place] was denied.  Dr Marin Olp discussed situation with pharmacist at [facility].  When patient started FOLFOX Opdivo HER 2 status not back yet so Opdivo was initiated.  Since then HER 2 status came back as positive.  Will wait to hear response.

## 2021-03-31 NOTE — Progress Notes (Signed)
Hematology and Oncology Follow Up Visit  ULMER DEGEN [id number] Jun 29, 1955 65 y.o. 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  Current Therapy:   FOLFOX/Nivolumab/Herceptin -- s/p cycle #8-- start on 10/22/2020 --given every 21-day cycles --oxaliplatin dropped on 03/11/2021 Neulasta 6 mg subcu post chemotherapy for neutropenia. IV iron given as indicated --Venofer given on 03/13/2021     Interim History:  Mr. Mccaskey is back for treatment.  We gave him a couple weeks off.  He is feeling better.  His weight is still down.  We will start him on some Marinol.  He is eating pretty well.  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  We stopped the oxaliplatin as I thought that was causing a lot of his issues.  We will have to see how he responds not having the oxaliplatin.  He has had no problems with diarrhea.  He is doing exercises at home.  His sister is a physical therapist down to [facility] and she deals with cancer patients.  I think he has a wonderful protocol that he is using try to help with his strength and balance.  He has had no cough.  There is been no bleeding.  I think he is getting some IV iron.  When we last saw him, his iron saturation was 19%.  We will go ahead and give him a dose of iron today.    I am glad that his quality life is doing little bit better.  Overall, I would say performance status right now is ECOG 1.     Medications:  Current Outpatient Medications:    ACCU-CHEK GUIDE test strip, TEST UPTO 4 TIMES A DAY, Disp: 100 strip, Rfl: 11   acetaminophen (TYLENOL) 500 MG tablet, Take 1,000 mg by mouth every 6 (six) hours as needed., Disp: , Rfl:    AMBULATORY NON FORMULARY MEDICATION, Medication Name: Glucometer and strips to test up to once a day. Dx diabetes, Disp: 1 Units, Rfl: 0   ARIPiprazole  (ABILIFY) 5 MG tablet, TAKE ONE TABLET (5 MG TOTAL) BY MOUTH DAILY, Disp: 30 tablet, Rfl: 3   atorvastatin (LIPITOR) 40 MG tablet, Take 1 tablet (40 mg total) by mouth at bedtime. Needs labs, Disp: 90 tablet, Rfl: 3   baclofen (LIORESAL) 10 MG tablet, Take 0.5 tablets (5 mg total) by mouth 3 (three) times daily., Disp: 30 each, Rfl: 0   blood glucose meter kit and supplies, Dispense based on patient and insurance preference. Use up to four times daily as directed. DX: E11.8, Disp: 1 each, Rfl: 0   cilostazol (PLETAL) 100 MG tablet, Take 100 mg by mouth 2 (two) times daily., Disp: , Rfl:    dexamethasone (DECADRON) 4 MG tablet, Take 1 tablet (4 mg total) by mouth 2 (two) times daily with a meal., Disp: 60 tablet, Rfl: 1   dicyclomine (BENTYL) 10 MG capsule, TAKE 1 CAPSULE (10 MG TOTAL) BY MOUTH 3 (THREE) TIMES DAILY BEFORE MEALS., Disp: 270 capsule, Rfl: 1   diphenoxylate-atropine (LOMOTIL) 2.5-0.025 MG tablet, TAKE 2 TABLETS BY MOUTH 4 (FOUR) TIMES DAILY AS NEEDED FOR DIARRHEA OR LOOSE STOOLS., Disp: 100 tablet, Rfl: 0   dronabinol (MARINOL) 5 MG capsule, Take 1 capsule (5 mg total) by mouth 2 (two) times daily before lunch and supper., Disp: 60 capsule, Rfl: 0   DULoxetine (CYMBALTA) 60  MG capsule, Take 60 mg by mouth daily., Disp: , Rfl:    eszopiclone (LUNESTA) 2 MG TABS tablet, Take 1 tablet (2 mg total) by mouth at bedtime as needed., Disp: 30 tablet, Rfl: 5   KLOR-CON M20 20 MEQ tablet, TAKE 2 TABLETS BY MOUTH 2 TIMES DAILY., Disp: 360 tablet, Rfl: 1   levothyroxine (SYNTHROID) 150 MCG tablet, TAKE 1 TABLET BY MOUTH DAILY BEFORE BREAKFAST., Disp: 90 tablet, Rfl: 1   lidocaine-prilocaine (EMLA) cream, Apply to affected area once, Disp: 30 g, Rfl: 3   lisinopril (ZESTRIL) 20 MG tablet, Take 20 mg by mouth daily., Disp: , Rfl:    LORazepam (ATIVAN) 0.5 MG tablet, Take 1 tablet (0.5 mg total) by mouth every 12 (twelve) hours as needed for anxiety., Disp: 30 tablet, Rfl: 0   NUVIGIL 250 MG tablet,  Take 250 mg by mouth daily., Disp: , Rfl:    ondansetron (ZOFRAN) 8 MG tablet, Take 1 tablet (8 mg total) by mouth 2 (two) times daily as needed for refractory nausea / vomiting. Start on day 3 after chemotherapy., Disp: 30 tablet, Rfl: 1   pantoprazole (PROTONIX) 40 MG tablet, Take 1 tablet by mouth daily., Disp: , Rfl:    pentoxifylline (TRENTAL) 400 MG CR tablet, Take by mouth., Disp: , Rfl:    pregabalin (LYRICA) 200 MG capsule, Take 1 capsule by mouth 3 (three) times daily., Disp: , Rfl:    prochlorperazine (COMPAZINE) 10 MG tablet, Take 1 tablet (10 mg total) by mouth every 6 (six) hours as needed (Nausea or vomiting)., Disp: 30 tablet, Rfl: 1   SODIUM FLUORIDE 5000 PPM 1.1 % PSTE, SMARTSIG:Sparingly Topical Every Night, Disp: , Rfl:    dexamethasone (DECADRON) 4 MG tablet, Take 2 tablets (8 mg total) by mouth daily. Start the day after chemotherapy for 2 days. Take with food. (Patient not taking: Reported on 03/31/2021), Disp: 30 tablet, Rfl: 1   doxycycline (VIBRAMYCIN) 100 MG capsule, Take 1 capsule (100 mg total) by mouth 2 (two) times daily., Disp: 20 capsule, Rfl: 0   prednisoLONE acetate (PRED FORTE) 1 % ophthalmic suspension, 1 drop 2 (two) times daily., Disp: , Rfl:   Current Facility-Administered Medications:    dexamethasone (DECADRON) injection 10 mg, 10 mg, Intravenous, Once, Felicita Nuncio, Rudell Cobb, MD  Allergies: No Known Allergies  Past Medical History, Surgical history, Social history, and Family History were reviewed and updated.  Review of Systems: Review of Systems  Constitutional: Negative.   HENT:  Negative.    Eyes: Negative.   Respiratory: Negative.    Cardiovascular: Negative.   Gastrointestinal: Negative.   Endocrine: Negative.   Genitourinary: Negative.    Musculoskeletal: Negative.   Skin: Negative.   Neurological: Negative.   Hematological: Negative.   Psychiatric/Behavioral: Negative.     Physical Exam:  weight is 180 lb 4 oz (81.8 kg). His oral  temperature is 98.2 F (36.8 C). His blood pressure is 113/67 and his pulse is 78. His respiration is 18 and oxygen saturation is 100%.   Wt Readings from Last 3 Encounters:  03/31/21 180 lb 4 oz (81.8 kg)  03/31/21 180 lb 0.6 oz (81.7 kg)  03/25/21 183 lb 1.9 oz (83.1 kg)    Physical Exam Vitals reviewed.  HENT:     Head: Normocephalic and atraumatic.  Eyes:     Pupils: Pupils are equal, round, and reactive to light.  Cardiovascular:     Rate and Rhythm: Normal rate and regular rhythm.     Heart sounds:  Normal heart sounds.  Pulmonary:     Effort: Pulmonary effort is normal.     Breath sounds: Normal breath sounds.  Abdominal:     General: Bowel sounds are normal.     Palpations: Abdomen is soft.  Musculoskeletal:        General: No tenderness or deformity. Normal range of motion.     Cervical back: Normal range of motion.  Lymphadenopathy:     Cervical: No cervical adenopathy.  Skin:    General: Skin is warm and dry.     Findings: No erythema or rash.  Neurological:     Mental Status: He is alert and oriented to person, place, and time.  Psychiatric:        Behavior: Behavior normal.        Thought Content: Thought content normal.        Judgment: Judgment normal.     Lab Results  Component Value Date   WBC 10.4 03/31/2021   HGB 10.9 (L) 03/31/2021   HCT 33.0 (L) 03/31/2021   MCV 94.6 03/31/2021   PLT 300 03/31/2021     Chemistry      Component Value Date/Time   NA 137 03/31/2021 0850   K 3.1 (L) 03/31/2021 0850   CL 102 03/31/2021 0850   CO2 27 03/31/2021 0850   BUN 19 03/31/2021 0850   CREATININE 0.90 03/31/2021 0850   CREATININE 0.96 09/05/2020 0000      Component Value Date/Time   CALCIUM 9.4 03/31/2021 0850   ALKPHOS 151 (H) 03/31/2021 0850   AST 24 03/31/2021 0850   ALT 23 03/31/2021 0850   BILITOT 0.4 03/31/2021 0850      Impression and Plan: Mr. Hing is a very nice 65 year old white male.  He has metastatic adenocarcinoma of the GE  junction.Marland Kitchen  His last PET scan was done back in August.  He had a very nice response.  Hopefully, he will continue to respond with stopping the oxaliplatin.  I want his quality of life to be better.  Again, we will give him some IV iron today.  We will check another PET scan on him.  We will get this done in a couple weeks.  Again, he looks a lot better.  Hopefully, he will do better with treatment now that the oxaliplatin has been stopped.     [name], MD 11/1/20229:48 AM

## 2021-03-31 NOTE — Telephone Encounter (Signed)
Received a call from [employer] manager) of [facility] stating that they are unable to overturn the decision on Opdivo on patient.  The first [place] treatment is covered from May17th but the others remain uncovered.  Recommended starting the appeal process.  Ross Ludwig notified to start the appeals process

## 2021-04-01 ENCOUNTER — Inpatient Hospital Stay: Payer: BC Managed Care – PPO | Admitting: Dietician

## 2021-04-01 ENCOUNTER — Telehealth: Payer: Self-pay | Admitting: Dietician

## 2021-04-01 ENCOUNTER — Encounter: Payer: Self-pay | Admitting: *Deleted

## 2021-04-01 LAB — IRON AND TIBC
Iron: 50 ug/dL (ref 42–163)
Saturation Ratios: 16 % — ABNORMAL LOW (ref 20–55)
TIBC: 319 ug/dL (ref 202–409)
UIBC: 269 ug/dL (ref 117–376)

## 2021-04-01 LAB — T4: T4, Total: 15.5 ug/dL — ABNORMAL HIGH (ref 4.5–12.0)

## 2021-04-01 LAB — FERRITIN: Ferritin: 107 ng/mL (ref 24–336)

## 2021-04-01 NOTE — Telephone Encounter (Signed)
Nutrition Follow-up:  Patient receiving FOLFOX/Nivolumab/Herceptin with Neulasta support for adenocarcinoma of GE junction. Oxaliplatin discontinued 10/12 due to side effects.   Spoke with patient via telephone. He reports feeling "much better" after 3 week treatment break. Patient reports his appetite has improved, he is taking Marinol twice/day. Patient denies altered taste, nausea, vomiting. He reports constipation, but had a small bowel movement last night. Patient reports usually having loose stools for a few days after treatment. He plans to eat a banana if he begins to have watery stools. This continues to work well for him. Patient reports starting an exercise program this week, he is working with his sister-in-law (retired PT). Patient is drinking a protein drink or CIB daily. He tried [employer] milk but did not care for it, says it was too thick. He reports frequent snacking in between meals (cheese, crackers, cookies), states he "gets the munchies something awful" on appetite stimulant. Yesterday he ate 2 breakfast burritos from [employer] for breakfast, had a bagel during infusion, BLT with plant-based bacon for dinner.    Medications: Ativan, Lomotil, Marinol, Klor-con  Labs: 11/1 - Glucose 123, K 3.1  Anthropometrics: Weight 180 lb 4 oz on 11/1 decreased 1.6% (3 lbs) from 183 lb 1.9 oz on 10/26 and 7.2% (14 lbs) from 194 lb 6.4 oz on 10/19. This is significant.   NUTRITION DIAGNOSIS: Inadequate oral intake ongoing   INTERVENTION:  Encouraged high calorie, high protein snacks in between meals Suggested patient have bedtime snack  Continue drinking CIB and Boost supplement, recommend patient to 2 supplements daily Encouraged activity as able  Continue taking appetite stimulant (Marinol) as prescribed per MD Patient has contact information    MONITORING, EVALUATION, GOAL: weight trends, intake   NEXT VISIT: Tuesday December 6 via telephone (pt aware)

## 2021-04-01 NOTE — Progress Notes (Signed)
Patient will need PET scan prior to his next appointment. PET scheduled for 04/13/2021. Patient is here tomorrow for pump dc. Will review appointment calendar with patient and provide patient will PET education when in the office.   Oncology Nurse Navigator Documentation  Oncology Nurse Navigator Flowsheets 04/01/2021  Abnormal Finding Date -  Confirmed Diagnosis Date -  Diagnosis Status -  Phase of Treatment -  Chemotherapy Actual Start Date: -  Chemotherapy Expected End Date: -  Navigator Follow Up Date: 04/13/2021  Navigator Follow Up Reason: Scan Review  Navigator Location [facility]  Navigator Encounter Type Appt/Treatment Plan Review  Telephone -  Treatment Initiated Date -  Patient Visit Type MedOnc  Treatment Phase Active Tx  Barriers/Navigation Needs Coordination of Care;Education  Education -  Interventions Coordination of Care  Acuity Level 2-Minimal Needs (1-2 Barriers Identified)  Coordination of Care Radiology  Education Method Written  Support Groups/Services Friends and Family  Time Spent with Patient 30

## 2021-04-02 ENCOUNTER — Inpatient Hospital Stay: Payer: BC Managed Care – PPO

## 2021-04-02 ENCOUNTER — Other Ambulatory Visit: Payer: Self-pay

## 2021-04-02 VITALS — BP 128/58 | HR 88 | Temp 98.1°F | Resp 17

## 2021-04-02 DIAGNOSIS — C160 Malignant neoplasm of cardia: Secondary | ICD-10-CM

## 2021-04-02 DIAGNOSIS — Z5112 Encounter for antineoplastic immunotherapy: Secondary | ICD-10-CM | POA: Diagnosis not present

## 2021-04-02 MED ORDER — HEPARIN SOD (PORK) LOCK FLUSH 100 UNIT/ML IV SOLN
500.0000 [IU] | Freq: Once | INTRAVENOUS | Status: AC | PRN
  Administered 2021-04-02: 500 [IU]

## 2021-04-02 MED ORDER — SODIUM CHLORIDE 0.9% FLUSH
10.0000 mL | INTRAVENOUS | Status: DC | PRN
  Administered 2021-04-02: 10 mL

## 2021-04-02 MED ORDER — PEGFILGRASTIM-JMDB 6 MG/0.6ML ~~LOC~~ SOSY
6.0000 mg | PREFILLED_SYRINGE | Freq: Once | SUBCUTANEOUS | Status: AC
  Administered 2021-04-02: 6 mg via SUBCUTANEOUS
  Filled 2021-04-02: qty 0.6

## 2021-04-02 NOTE — Patient Instructions (Signed)

## 2021-04-03 ENCOUNTER — Inpatient Hospital Stay: Payer: BC Managed Care – PPO

## 2021-04-03 ENCOUNTER — Inpatient Hospital Stay: Payer: BC Managed Care – PPO | Admitting: Hematology & Oncology

## 2021-04-08 ENCOUNTER — Encounter: Payer: Self-pay | Admitting: Hematology & Oncology

## 2021-04-13 ENCOUNTER — Ambulatory Visit (HOSPITAL_COMMUNITY)
Admission: RE | Admit: 2021-04-13 | Discharge: 2021-04-13 | Disposition: A | Discharge status: Home / Self Care | Payer: BC Managed Care – PPO | Source: Ambulatory Visit | Attending: Hematology & Oncology | Admitting: Hematology & Oncology

## 2021-04-13 ENCOUNTER — Other Ambulatory Visit: Payer: Self-pay

## 2021-04-13 DIAGNOSIS — C160 Malignant neoplasm of cardia: Secondary | ICD-10-CM | POA: Diagnosis present

## 2021-04-13 LAB — GLUCOSE, CAPILLARY: Glucose-Capillary: 95 mg/dL (ref 70–99)

## 2021-04-13 MED ORDER — FLUDEOXYGLUCOSE F - 18 (FDG) INJECTION
9.0000 | Freq: Once | INTRAVENOUS | Status: AC
  Administered 2021-04-13: 9 via INTRAVENOUS

## 2021-04-14 ENCOUNTER — Telehealth: Payer: Self-pay

## 2021-04-14 ENCOUNTER — Encounter: Payer: Self-pay | Admitting: *Deleted

## 2021-04-14 NOTE — Telephone Encounter (Signed)
Patient called stating his numbness in his hands are worsening and just wanted to let us know. Called patient back and informed him since the oxaliplatin was just stopped recently it could still be in his system and hopefully will improve, informed him per Gillian Shields to continue taking his lyrica and since he sees MD next week and if it isnt better they could discuss other medications to help. Pt verbalized understanding and denies any other questions or concerns at this time.

## 2021-04-14 NOTE — Progress Notes (Signed)
Oncology Nurse Navigator Documentation  Oncology Nurse Navigator Flowsheets 04/14/2021  Abnormal Finding Date -  Confirmed Diagnosis Date -  Diagnosis Status -  Phase of Treatment -  Chemotherapy Actual Start Date: -  Chemotherapy Expected End Date: -  Navigator Follow Up Date: 04/20/2021  Navigator Follow Up Reason: Follow-up Appointment  Navigator Location [facility]  Navigator Encounter Type Scan Review  Telephone -  Treatment Initiated Date -  Patient Visit Type MedOnc  Treatment Phase Active Tx  Barriers/Navigation Needs Coordination of Care;Education  Education -  Interventions None Required  Acuity Level 2-Minimal Needs (1-2 Barriers Identified)  Coordination of Care -  Education Method -  Support Groups/Services Friends and Family  Time Spent with Patient 15

## 2021-04-15 ENCOUNTER — Other Ambulatory Visit: Payer: BC Managed Care – PPO

## 2021-04-15 ENCOUNTER — Ambulatory Visit: Payer: BC Managed Care – PPO

## 2021-04-15 ENCOUNTER — Ambulatory Visit: Payer: BC Managed Care – PPO | Admitting: Hematology & Oncology

## 2021-04-20 ENCOUNTER — Inpatient Hospital Stay: Payer: BC Managed Care – PPO

## 2021-04-20 ENCOUNTER — Encounter: Payer: Self-pay | Admitting: *Deleted

## 2021-04-20 ENCOUNTER — Encounter: Payer: Self-pay | Admitting: Hematology & Oncology

## 2021-04-20 ENCOUNTER — Inpatient Hospital Stay (HOSPITAL_BASED_OUTPATIENT_CLINIC_OR_DEPARTMENT_OTHER): Payer: BC Managed Care – PPO | Admitting: Hematology & Oncology

## 2021-04-20 ENCOUNTER — Other Ambulatory Visit: Payer: Self-pay

## 2021-04-20 VITALS — BP 143/72 | HR 80 | Temp 98.0°F | Resp 18 | Wt 192.0 lb

## 2021-04-20 DIAGNOSIS — C160 Malignant neoplasm of cardia: Secondary | ICD-10-CM

## 2021-04-20 DIAGNOSIS — Z5112 Encounter for antineoplastic immunotherapy: Secondary | ICD-10-CM | POA: Diagnosis not present

## 2021-04-20 LAB — IRON AND TIBC
Iron: 71 ug/dL (ref 42–163)
Saturation Ratios: 19 % — ABNORMAL LOW (ref 20–55)
TIBC: 381 ug/dL (ref 202–409)
UIBC: 310 ug/dL (ref 117–376)

## 2021-04-20 LAB — CBC WITH DIFFERENTIAL (CANCER CENTER ONLY)
Abs Immature Granulocytes: 0.44 10*3/uL — ABNORMAL HIGH (ref 0.00–0.07)
Basophils Absolute: 0 10*3/uL (ref 0.0–0.1)
Basophils Relative: 0 %
Eosinophils Absolute: 0.1 10*3/uL (ref 0.0–0.5)
Eosinophils Relative: 1 %
HCT: 34.7 % — ABNORMAL LOW (ref 39.0–52.0)
Hemoglobin: 11.6 g/dL — ABNORMAL LOW (ref 13.0–17.0)
Immature Granulocytes: 4 %
Lymphocytes Relative: 11 %
Lymphs Abs: 1.2 10*3/uL (ref 0.7–4.0)
MCH: 32.3 pg (ref 26.0–34.0)
MCHC: 33.4 g/dL (ref 30.0–36.0)
MCV: 96.7 fL (ref 80.0–100.0)
Monocytes Absolute: 1.1 10*3/uL — ABNORMAL HIGH (ref 0.1–1.0)
Monocytes Relative: 10 %
Neutro Abs: 8.7 10*3/uL — ABNORMAL HIGH (ref 1.7–7.7)
Neutrophils Relative %: 74 %
Platelet Count: 203 10*3/uL (ref 150–400)
RBC: 3.59 MIL/uL — ABNORMAL LOW (ref 4.22–5.81)
RDW: 17.6 % — ABNORMAL HIGH (ref 11.5–15.5)
WBC Count: 11.6 10*3/uL — ABNORMAL HIGH (ref 4.0–10.5)
nRBC: 0 % (ref 0.0–0.2)

## 2021-04-20 LAB — CMP (CANCER CENTER ONLY)
ALT: 23 U/L (ref 0–44)
AST: 21 U/L (ref 15–41)
Albumin: 4 g/dL (ref 3.5–5.0)
Alkaline Phosphatase: 99 U/L (ref 38–126)
Anion gap: 9 (ref 5–15)
BUN: 22 mg/dL (ref 8–23)
CO2: 28 mmol/L (ref 22–32)
Calcium: 9.5 mg/dL (ref 8.9–10.3)
Chloride: 98 mmol/L (ref 98–111)
Creatinine: 0.86 mg/dL (ref 0.61–1.24)
GFR, Estimated: >60 mL/min (ref >60)
Glucose, Bld: 163 mg/dL — ABNORMAL HIGH (ref 70–99)
Potassium: 4 mmol/L (ref 3.5–5.1)
Sodium: 135 mmol/L (ref 135–145)
Total Bilirubin: 0.3 mg/dL (ref 0.3–1.2)
Total Protein: 6.1 g/dL — ABNORMAL LOW (ref 6.5–8.1)

## 2021-04-20 LAB — RETICULOCYTES
Immature Retic Fract: 17.6 % — ABNORMAL HIGH (ref 2.3–15.9)
RBC.: 3.58 MIL/uL — ABNORMAL LOW (ref 4.22–5.81)
Retic Count, Absolute: 116.4 10*3/uL (ref 19.0–186.0)
Retic Ct Pct: 3.3 % — ABNORMAL HIGH (ref 0.4–3.1)

## 2021-04-20 LAB — TSH: TSH: 0.248 u[IU]/mL — ABNORMAL LOW (ref 0.320–4.118)

## 2021-04-20 LAB — FERRITIN: Ferritin: 98 ng/mL (ref 24–336)

## 2021-04-20 NOTE — Progress Notes (Signed)
Hematology and Oncology Follow Up Visit  OJANI BERENSON [id number] 10-14-1955 65 y.o. 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  Current Therapy:   FOLFOX/Nivolumab/Herceptin -- s/p cycle #8-- start on 10/22/2020 --given every 21-day cycles --oxaliplatin dropped on 03/11/2021 -- d/c on 04/20/21 for progression Enhertu -- start cycle #1 on 04/27/2021 IV iron given as indicated --Venofer given on 03/31/2021     Interim History:  Mr. Callegari is back for follow-up.  Unfortunately, he is now beginning to progress.  Some of this may have to do with the fact that we dropped the oxaliplatin because of neuropathy.  On his PET scan that had done last week, he had increased activity in the stomach.  Also noted was some increased activity in the left upper paratracheal lymph node.  There is also activity in a retrocaval lymph node.  I think that we have to make a treatment change.  Again, his tumor is HER2 positive.  As such, I think that we can consider him for  Enhertu.  I think this would be reasonable.  He should be able to tolerate this.  Hopefully, his neuropathy will improve.  Is going have a nice Thanksgiving.  A sister is coming down from [place].  Her niece is flying from [place].  He has had neuropathy.  I think some of this is from diabetes.  I am sure that the oxaliplatin did not help.  He has had no fever.  He has had no obvious bleeding.  His last iron studies back in early November showed a ferritin of 107 with iron saturation of 16%.    Overall, I would say performance status right now is ECOG 1.     Medications:  Current Outpatient Medications:    ACCU-CHEK GUIDE test strip, TEST UPTO 4 TIMES A DAY, Disp: 100 strip, Rfl: 11   acetaminophen (TYLENOL) 500 MG tablet, Take 1,000 mg by mouth every 6 (six) hours as needed., Disp: , Rfl:    AMBULATORY NON FORMULARY MEDICATION,  Medication Name: Glucometer and strips to test up to once a day. Dx diabetes, Disp: 1 Units, Rfl: 0   ARIPiprazole (ABILIFY) 5 MG tablet, TAKE ONE TABLET (5 MG TOTAL) BY MOUTH DAILY, Disp: 30 tablet, Rfl: 3   atorvastatin (LIPITOR) 40 MG tablet, Take 1 tablet (40 mg total) by mouth at bedtime. Needs labs, Disp: 90 tablet, Rfl: 3   baclofen (LIORESAL) 10 MG tablet, Take 0.5 tablets (5 mg total) by mouth 3 (three) times daily., Disp: 30 each, Rfl: 0   blood glucose meter kit and supplies, Dispense based on patient and insurance preference. Use up to four times daily as directed. 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 Start the day after chemotherapy for 2 days. Take with food. (Patient not taking: Reported on 03/31/2021), Disp: 30 tablet, Rfl: 1   dexamethasone (DECADRON) 4 MG tablet, Take 1 tablet (4 mg total) by mouth 2 (two) times daily with a meal., Disp: 60 tablet, Rfl: 1   dicyclomine (BENTYL) 10 MG capsule, TAKE 1 CAPSULE (10 MG TOTAL) BY MOUTH 3 (THREE) TIMES DAILY BEFORE MEALS., Disp: 270 capsule, Rfl: 1   diphenoxylate-atropine (LOMOTIL) 2.5-0.025 MG tablet, TAKE 2 TABLETS BY MOUTH  4 (FOUR) TIMES DAILY AS NEEDED FOR DIARRHEA OR LOOSE STOOLS., Disp: 100 tablet, Rfl: 0   doxycycline (VIBRAMYCIN) 100 MG capsule, Take 1 capsule (100 mg total) by mouth 2 (two) times daily., Disp: 20 capsule, Rfl: 0   dronabinol (MARINOL) 5 MG capsule, Take 1 capsule (5 mg total) by mouth 2 (two) times daily before lunch and supper., Disp: 60 capsule, Rfl: 0   DULoxetine (CYMBALTA) 60 MG capsule, Take 60 mg by mouth daily., Disp: , Rfl:    eszopiclone (LUNESTA) 2 MG TABS tablet, Take 1 tablet (2 mg total) by mouth at bedtime as needed., Disp: 30 tablet, Rfl: 5   KLOR-CON M20 20 MEQ tablet, TAKE 2 TABLETS BY MOUTH 2 TIMES DAILY., Disp: 360 tablet,  Rfl: 1   levothyroxine (SYNTHROID) 150 MCG tablet, TAKE 1 TABLET BY MOUTH DAILY BEFORE BREAKFAST., Disp: 90 tablet, Rfl: 1   lidocaine-prilocaine (EMLA) cream, Apply to affected area once, Disp: 30 g, Rfl: 3   lisinopril (ZESTRIL) 20 MG tablet, Take 20 mg by mouth daily., Disp: , Rfl:    LORazepam (ATIVAN) 0.5 MG tablet, Take 1 tablet (0.5 mg total) by mouth every 12 (twelve) hours as needed for anxiety., Disp: 30 tablet, Rfl: 0   NUVIGIL 250 MG tablet, Take 250 mg by mouth daily., Disp: , Rfl:    ondansetron (ZOFRAN) 8 MG tablet, Take 1 tablet (8 mg total) by mouth 2 (two) times daily as needed for refractory nausea / vomiting. Start on day 3 after chemotherapy., Disp: 30 tablet, Rfl: 1   pantoprazole (PROTONIX) 40 MG tablet, Take 1 tablet by mouth daily., Disp: , Rfl:    pentoxifylline (TRENTAL) 400 MG CR tablet, Take by mouth., Disp: , Rfl:    prednisoLONE acetate (PRED FORTE) 1 % ophthalmic suspension, 1 drop 2 (two) times daily., Disp: , Rfl:    pregabalin (LYRICA) 200 MG capsule, Take 1 capsule by mouth 3 (three) times daily., Disp: , Rfl:    prochlorperazine (COMPAZINE) 10 MG tablet, Take 1 tablet (10 mg total) by mouth every 6 (six) hours as needed (Nausea or vomiting)., Disp: 30 tablet, Rfl: 1   SODIUM FLUORIDE 5000 PPM 1.1 % PSTE, SMARTSIG:Sparingly Topical Every Night, Disp: , Rfl:   Current Facility-Administered Medications:    dexamethasone (DECADRON) injection 10 mg, 10 mg, Intravenous, Once, Maud Rubendall, Rudell Cobb, MD  Allergies: No Known Allergies  Past Medical History, Surgical history, Social history, and Family History were reviewed and updated.  Review of Systems: Review of Systems  Constitutional: Negative.   HENT:  Negative.    Eyes: Negative.   Respiratory: Negative.    Cardiovascular: Negative.   Gastrointestinal: Negative.   Endocrine: Negative.   Genitourinary: Negative.    Musculoskeletal: Negative.   Skin: Negative.   Neurological: Negative.   Hematological:  Negative.   Psychiatric/Behavioral: Negative.     Physical Exam:  vitals were not taken for this visit.   Wt Readings from Last 3 Encounters:  03/31/21 180 lb 4 oz (81.8 kg)  03/31/21 180 lb 0.6 oz (81.7 kg)  03/25/21 183 lb 1.9 oz (83.1 kg)    Physical Exam Vitals reviewed.  HENT:     Head: Normocephalic and atraumatic.  Eyes:     Pupils: Pupils are equal, round, and reactive to light.  Cardiovascular:     Rate and Rhythm: Normal rate and regular rhythm.     Heart sounds: Normal heart sounds.  Pulmonary:     Effort: Pulmonary effort is normal.  Breath sounds: Normal breath sounds.  Abdominal:     General: Bowel sounds are normal.     Palpations: Abdomen is soft.  Musculoskeletal:        General: No tenderness or deformity. Normal range of motion.     Cervical back: Normal range of motion.  Lymphadenopathy:     Cervical: No cervical adenopathy.  Skin:    General: Skin is warm and dry.     Findings: No erythema or rash.  Neurological:     Mental Status: He is alert and oriented to person, place, and time.  Psychiatric:        Behavior: Behavior normal.        Thought Content: Thought content normal.        Judgment: Judgment normal.     Lab Results  Component Value Date   WBC 10.4 03/31/2021   HGB 10.9 (L) 03/31/2021   HCT 33.0 (L) 03/31/2021   MCV 94.6 03/31/2021   PLT 300 03/31/2021     Chemistry      Component Value Date/Time   NA 137 03/31/2021 0850   K 3.1 (L) 03/31/2021 0850   CL 102 03/31/2021 0850   CO2 27 03/31/2021 0850   BUN 19 03/31/2021 0850   CREATININE 0.90 03/31/2021 0850   CREATININE 0.96 09/05/2020 0000      Component Value Date/Time   CALCIUM 9.4 03/31/2021 0850   ALKPHOS 151 (H) 03/31/2021 0850   AST 24 03/31/2021 0850   ALT 23 03/31/2021 0850   BILITOT 0.4 03/31/2021 0850      Impression and Plan: Mr. Budnick is a very nice 65 year old white male.  He has metastatic adenocarcinoma of the GE junction.Marland Kitchen  He initially had a  response to treatment.  Unfortunately, I do think because of toxicity of treatment, even with dose reduction, we are wise to make a change.  The PET scan really is not all that bad..  I do think that Enhertu would not be a bad idea for him.  I think he could tolerate this.  I think that side effect profile would be very manageable.  We will go ahead and plan to start next week.  At least, he will be able to have a wonderful Thanksgiving with his family.  I know this is important for him.  Will plan to come to get him back to see [place] the day of his second cycle of treatment.  We are probably will give him 3 cycles of Enhertu and then repeat the PET scan.     [name], MD 11/21/20228:15 AM

## 2021-04-20 NOTE — Progress Notes (Signed)
Due to progression, patient will not be treated today. He will begin a new treatment regimen next week.   Oncology Nurse Navigator Documentation  Oncology Nurse Navigator Flowsheets 04/20/2021  Abnormal Finding Date -  Confirmed Diagnosis Date -  Diagnosis Status -  Phase of Treatment -  Chemotherapy Actual Start Date: -  Chemotherapy Expected End Date: -  Navigator Follow Up Date: 05/20/2021  Navigator Follow Up Reason: Follow-up Appointment;Chemotherapy  Navigator Location [facility]  Navigator Encounter Type Treatment;Appt/Treatment Plan Review  Telephone -  Treatment Initiated Date -  Patient Visit Type MedOnc  Treatment Phase Active Tx  Barriers/Navigation Needs Coordination of Care;Education  Education -  Interventions Psycho-Social Support  Acuity Level 2-Minimal Needs (1-2 Barriers Identified)  Coordination of Care -  Education Method -  Support Groups/Services Friends and Family  Time Spent with Patient 15

## 2021-04-20 NOTE — Progress Notes (Signed)
Pharmacist Chemotherapy Monitoring - Initial Assessment    Anticipated start date: 04/27/21   The following has been reviewed per standard work regarding the patient's treatment regimen: The patient's diagnosis, treatment plan and drug doses, and organ/hematologic function Lab orders and baseline tests specific to treatment regimen  The treatment plan start date, drug sequencing, and pre-medications Prior authorization status  Patient's documented medication list, including drug-drug interaction screen and prescriptions for anti-emetics and supportive care specific to the treatment regimen The drug concentrations, fluid compatibility, administration routes, and timing of the medications to be used The patient's access for treatment and lifetime cumulative dose history, if applicable  The patient's medication allergies and previous infusion related reactions, if applicable   Changes made to treatment plan:  N/A  Follow up needed:  Pending authorization for treatment    Aviendha Azbell, Jacqlyn Larsen, [facility], 04/20/2021  1:28 PM

## 2021-04-20 NOTE — Patient Instructions (Signed)

## 2021-04-21 ENCOUNTER — Other Ambulatory Visit: Payer: Self-pay

## 2021-04-21 ENCOUNTER — Other Ambulatory Visit: Payer: BLUE CROSS/BLUE SHIELD

## 2021-04-21 ENCOUNTER — Ambulatory Visit: Payer: Self-pay | Admitting: Family

## 2021-04-21 ENCOUNTER — Ambulatory Visit: Payer: Self-pay

## 2021-04-21 LAB — T4: T4, Total: 6.6 ug/dL (ref 4.5–12.0)

## 2021-04-22 ENCOUNTER — Other Ambulatory Visit: Payer: Self-pay | Admitting: *Deleted

## 2021-04-22 ENCOUNTER — Inpatient Hospital Stay: Payer: BC Managed Care – PPO

## 2021-04-22 DIAGNOSIS — C160 Malignant neoplasm of cardia: Secondary | ICD-10-CM

## 2021-04-24 ENCOUNTER — Other Ambulatory Visit: Payer: Self-pay | Admitting: Hematology & Oncology

## 2021-04-25 ENCOUNTER — Encounter: Payer: Self-pay | Admitting: Hematology & Oncology

## 2021-04-27 ENCOUNTER — Inpatient Hospital Stay: Payer: BC Managed Care – PPO

## 2021-04-27 ENCOUNTER — Other Ambulatory Visit: Payer: Self-pay

## 2021-04-27 ENCOUNTER — Other Ambulatory Visit: Payer: Self-pay | Admitting: Hematology & Oncology

## 2021-04-27 VITALS — BP 124/60 | HR 73

## 2021-04-27 DIAGNOSIS — C160 Malignant neoplasm of cardia: Secondary | ICD-10-CM

## 2021-04-27 DIAGNOSIS — Z5112 Encounter for antineoplastic immunotherapy: Secondary | ICD-10-CM | POA: Diagnosis not present

## 2021-04-27 LAB — CBC WITH DIFFERENTIAL (CANCER CENTER ONLY)
Abs Immature Granulocytes: 0.16 10*3/uL — ABNORMAL HIGH (ref 0.00–0.07)
Basophils Absolute: 0 10*3/uL (ref 0.0–0.1)
Basophils Relative: 0 %
Eosinophils Absolute: 0.1 10*3/uL (ref 0.0–0.5)
Eosinophils Relative: 1 %
HCT: 33.5 % — ABNORMAL LOW (ref 39.0–52.0)
Hemoglobin: 11.4 g/dL — ABNORMAL LOW (ref 13.0–17.0)
Immature Granulocytes: 2 %
Lymphocytes Relative: 13 %
Lymphs Abs: 1.2 10*3/uL (ref 0.7–4.0)
MCH: 32.9 pg (ref 26.0–34.0)
MCHC: 34 g/dL (ref 30.0–36.0)
MCV: 96.5 fL (ref 80.0–100.0)
Monocytes Absolute: 0.8 10*3/uL (ref 0.1–1.0)
Monocytes Relative: 8 %
Neutro Abs: 7.3 10*3/uL (ref 1.7–7.7)
Neutrophils Relative %: 76 %
Platelet Count: 167 10*3/uL (ref 150–400)
RBC: 3.47 MIL/uL — ABNORMAL LOW (ref 4.22–5.81)
RDW: 17.3 % — ABNORMAL HIGH (ref 11.5–15.5)
WBC Count: 9.5 10*3/uL (ref 4.0–10.5)
nRBC: 0 % (ref 0.0–0.2)

## 2021-04-27 LAB — COMPREHENSIVE METABOLIC PANEL
ALT: 29 U/L (ref 0–44)
AST: 27 U/L (ref 15–41)
Albumin: 3.8 g/dL (ref 3.5–5.0)
Alkaline Phosphatase: 91 U/L (ref 38–126)
Anion gap: 13 (ref 5–15)
BUN: 17 mg/dL (ref 8–23)
CO2: 26 mmol/L (ref 22–32)
Calcium: 9.3 mg/dL (ref 8.9–10.3)
Chloride: 95 mmol/L — ABNORMAL LOW (ref 98–111)
Creatinine, Ser: 0.92 mg/dL (ref 0.61–1.24)
GFR, Estimated: >60 mL/min (ref >60)
Glucose, Bld: 188 mg/dL — ABNORMAL HIGH (ref 70–99)
Potassium: 3.7 mmol/L (ref 3.5–5.1)
Sodium: 134 mmol/L — ABNORMAL LOW (ref 135–145)
Total Bilirubin: 0.4 mg/dL (ref 0.3–1.2)
Total Protein: 5.8 g/dL — ABNORMAL LOW (ref 6.5–8.1)

## 2021-04-27 MED ORDER — SODIUM CHLORIDE 0.9% FLUSH
10.0000 mL | INTRAVENOUS | Status: DC | PRN
  Administered 2021-04-27: 12:00:00 10 mL

## 2021-04-27 MED ORDER — PALONOSETRON HCL INJECTION 0.25 MG/5ML
0.2500 mg | Freq: Once | INTRAVENOUS | Status: AC
  Administered 2021-04-27: 09:00:00 0.25 mg via INTRAVENOUS
  Filled 2021-04-27: qty 5

## 2021-04-27 MED ORDER — HEPARIN SOD (PORK) LOCK FLUSH 100 UNIT/ML IV SOLN
500.0000 [IU] | Freq: Once | INTRAVENOUS | Status: AC | PRN
  Administered 2021-04-27: 12:00:00 500 [IU]

## 2021-04-27 MED ORDER — LIDOCAINE-PRILOCAINE 2.5-2.5 % EX CREA: TOPICAL_CREAM | CUTANEOUS | 3 refills | Status: AC

## 2021-04-27 MED ORDER — DIPHENHYDRAMINE HCL 25 MG PO CAPS
50.0000 mg | ORAL_CAPSULE | Freq: Once | ORAL | Status: AC
  Administered 2021-04-27: 09:00:00 50 mg via ORAL
  Filled 2021-04-27: qty 2

## 2021-04-27 MED ORDER — PROCHLORPERAZINE MALEATE 10 MG PO TABS: 10.0000 mg | ORAL_TABLET | Freq: Four times a day (QID) | ORAL | 1 refills | Status: AC | PRN

## 2021-04-27 MED ORDER — SODIUM CHLORIDE 0.9 % IV SOLN
10.0000 mg | Freq: Once | INTRAVENOUS | Status: AC
  Administered 2021-04-27: 09:00:00 10 mg via INTRAVENOUS
  Filled 2021-04-27: qty 10

## 2021-04-27 MED ORDER — ACETAMINOPHEN 325 MG PO TABS
650.0000 mg | ORAL_TABLET | Freq: Once | ORAL | Status: AC
  Administered 2021-04-27: 09:00:00 650 mg via ORAL
  Filled 2021-04-27: qty 2

## 2021-04-27 MED ORDER — DEXTROSE 5 % IV SOLN: Freq: Once | INTRAVENOUS | Status: AC

## 2021-04-27 MED ORDER — ONDANSETRON HCL 8 MG PO TABS: 8.0000 mg | ORAL_TABLET | Freq: Two times a day (BID) | ORAL | 1 refills | Status: AC | PRN

## 2021-04-27 MED ORDER — FAM-TRASTUZUMAB DERUXTECAN-NXKI CHEMO 100 MG IV SOLR
6.4000 mg/kg | Freq: Once | INTRAVENOUS | Status: AC
  Administered 2021-04-27: 10:00:00 558 mg via INTRAVENOUS
  Filled 2021-04-27: qty 27.9

## 2021-04-27 MED ORDER — DEXAMETHASONE 4 MG PO TABS: 8.0000 mg | ORAL_TABLET | Freq: Every day | ORAL | 1 refills | Status: AC

## 2021-04-27 MED ORDER — DULOXETINE HCL 60 MG PO CPEP
60.0000 mg | ORAL_CAPSULE | Freq: Two times a day (BID) | ORAL | 1 refills | Status: DC
Start: 2021-04-27 — End: 2021-04-27

## 2021-04-27 NOTE — Progress Notes (Signed)
Patient complained of increase in neuropathy in hands, states he takes his lyrica 3 times a day and cymbalta once a day, has taken his cymbalta twice a day and it helped. Discussed with MD who gave verbal orders to increase cymbalta 60mg  to twice daily. Patient aware and new rx sent to pharmacy

## 2021-04-27 NOTE — Patient Instructions (Signed)
[facility]  Discharge Instructions: Thank you for choosing [facility] to provide your oncology and hematology care.   If you have a lab appointment with the Cancer Center, please go directly to the Cancer Center and check in at the registration area.  Wear comfortable clothing and clothing appropriate for easy access to any Portacath or PICC line.   We strive to give you quality time with your provider. You may need to reschedule your appointment if you arrive late (15 or more minutes).  Arriving late affects you and other patients whose appointments are after yours.  Also, if you miss three or more appointments without notifying the office, you may be dismissed from the clinic at the provider's discretion.      For prescription refill requests, have your pharmacy contact our office and allow 72 hours for refills to be completed.    Today you received the following chemotherapy and/or immunotherapy agents Enhurtu      To help prevent nausea and vomiting after your treatment, we encourage you to take your nausea medication as directed.  BELOW ARE SYMPTOMS THAT SHOULD BE REPORTED IMMEDIATELY: *FEVER GREATER THAN 100.4 F (38 C) OR HIGHER *CHILLS OR SWEATING *NAUSEA AND VOMITING THAT IS NOT CONTROLLED WITH YOUR NAUSEA MEDICATION *UNUSUAL SHORTNESS OF BREATH *UNUSUAL BRUISING OR BLEEDING *URINARY PROBLEMS (pain or burning when urinating, or frequent urination) *BOWEL PROBLEMS (unusual diarrhea, constipation, pain near the anus) TENDERNESS IN MOUTH AND THROAT WITH OR WITHOUT PRESENCE OF ULCERS (sore throat, sores in mouth, or a toothache) UNUSUAL RASH, SWELLING OR PAIN  UNUSUAL VAGINAL DISCHARGE OR ITCHING   Items with * indicate a potential emergency and should be followed up as soon as possible or go to the Emergency Department if any problems should occur.  Please show the CHEMOTHERAPY ALERT CARD or IMMUNOTHERAPY ALERT CARD at check-in to the  Emergency Department and triage nurse. Should you have questions after your visit or need to cancel or reschedule your appointment, please contact  [facility]  [phone] and follow the prompts.  Office hours are 8:00 a.m. to 4:30 p.m. Monday - Friday. Please note that voicemails left after 4:00 p.m. may not be returned until the following business day.  We are closed weekends and major holidays. You have access to a nurse at all times for urgent questions. Please call the main number to the clinic [phone] and follow the prompts.  For any non-urgent questions, you may also contact your provider using MyChart. We now offer e-Visits for anyone 18 and older to request care online for non-urgent symptoms. For details visit [email or web address].   Also download the MyChart app! Go to the app store, search "MyChart", open the app, select , and log in with your MyChart username and password.  Due to Covid, a mask is required upon entering the hospital/clinic. If you do not have a mask, one will be given to you upon arrival. For doctor visits, patients may have 1 support person aged 18 or older with them. For treatment visits, patients cannot have anyone with them due to current Covid guidelines and our immunocompromised population.  

## 2021-04-28 ENCOUNTER — Telehealth: Payer: Self-pay

## 2021-04-28 ENCOUNTER — Encounter: Payer: Self-pay | Admitting: Hematology & Oncology

## 2021-04-28 NOTE — Telephone Encounter (Signed)
Received call from patient reporting that he awoke today with BLE edema. Pt started a new treatment yesterday, Enhertu. Lower extremity swelling is a less common side effect of this treatment. Pt denies redness, swelling, pain.   Per Dr Marin Olp, elevate and continue to monitor at this time. Pt aware to notify our office if symptoms worsen, if legs become red, warm or if swelling is unilateral, or if he becomes SOB. Eddie Hernandez verbalizes understanding using teachback. dph

## 2021-04-30 ENCOUNTER — Ambulatory Visit: Payer: BLUE CROSS/BLUE SHIELD | Admitting: Family Medicine

## 2021-05-05 ENCOUNTER — Telehealth: Payer: Self-pay | Admitting: Dietician

## 2021-05-05 ENCOUNTER — Inpatient Hospital Stay: Payer: Medicare Other | Attending: Hematology & Oncology | Admitting: Dietician

## 2021-05-05 DIAGNOSIS — Z79899 Other long term (current) drug therapy: Secondary | ICD-10-CM | POA: Insufficient documentation

## 2021-05-05 DIAGNOSIS — C160 Malignant neoplasm of cardia: Secondary | ICD-10-CM | POA: Insufficient documentation

## 2021-05-05 DIAGNOSIS — Z7952 Long term (current) use of systemic steroids: Secondary | ICD-10-CM | POA: Insufficient documentation

## 2021-05-05 DIAGNOSIS — R252 Cramp and spasm: Secondary | ICD-10-CM | POA: Insufficient documentation

## 2021-05-05 DIAGNOSIS — Z5112 Encounter for antineoplastic immunotherapy: Secondary | ICD-10-CM | POA: Insufficient documentation

## 2021-05-05 DIAGNOSIS — D509 Iron deficiency anemia, unspecified: Secondary | ICD-10-CM | POA: Insufficient documentation

## 2021-05-05 NOTE — Telephone Encounter (Signed)
Nutrition Follow-up:  Patient with progression. 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   Spoke with patient via telephone. He reports tolerating new treatment well, denies nutrition impact symptoms. Patient reports appetite is "too good." He is eating a variety of foods and gaining weight. He is taking appetite stimulant. Patient continues drinking CIB and Ensure. Patient has been walking daily at [place], recalls 1.75 miles this morning. He continues working with his sister-in-law (retired PT) weekly. Patient reports improved strength and balance. Patient reports worsening neuropathy, reports fingertips hurt constantly. Patient reports MD recently increased Cymbalta, this has helped some.    Medications: Marinol  Labs: 11/28 - Na 134, Glucose 188  Anthropometrics: Last weight 192 lb on 11/21 increased from 180 lb 4 oz on 11/1  10/26 - 183 lb 1.9 oz 10/19 - 194 lb 6.4 oz    NUTRITION DIAGNOSIS: Inadequate oral intake improved    INTERVENTION:  Continue eating high calorie, high protein foods for weight maintenance Continue drinking 1-2 Ensure Plus/equivalent daily - will mail coupons If available, will provide one complimentary case of Ensure on 12/21 - noted in infusion appointment Encouraged activity as able Patient has contact information     MONITORING, EVALUATION, GOAL: weight trends, intake   NEXT VISIT: Thursday, January 12 via telephone (pt aware)

## 2021-05-20 ENCOUNTER — Other Ambulatory Visit: Payer: Self-pay

## 2021-05-20 ENCOUNTER — Inpatient Hospital Stay: Payer: Medicare Other

## 2021-05-20 ENCOUNTER — Encounter: Payer: Self-pay | Admitting: *Deleted

## 2021-05-20 ENCOUNTER — Telehealth: Payer: Self-pay

## 2021-05-20 ENCOUNTER — Inpatient Hospital Stay (HOSPITAL_BASED_OUTPATIENT_CLINIC_OR_DEPARTMENT_OTHER): Payer: Medicare Other | Admitting: Hematology & Oncology

## 2021-05-20 ENCOUNTER — Encounter: Payer: Self-pay | Admitting: Hematology & Oncology

## 2021-05-20 VITALS — BP 128/70 | HR 76 | Resp 17

## 2021-05-20 VITALS — BP 149/60 | HR 69 | Temp 97.9°F | Resp 17 | Ht 72.0 in | Wt 202.0 lb

## 2021-05-20 DIAGNOSIS — Z5112 Encounter for antineoplastic immunotherapy: Secondary | ICD-10-CM | POA: Diagnosis present

## 2021-05-20 DIAGNOSIS — Z7952 Long term (current) use of systemic steroids: Secondary | ICD-10-CM | POA: Diagnosis not present

## 2021-05-20 DIAGNOSIS — D500 Iron deficiency anemia secondary to blood loss (chronic): Secondary | ICD-10-CM

## 2021-05-20 DIAGNOSIS — C160 Malignant neoplasm of cardia: Secondary | ICD-10-CM

## 2021-05-20 DIAGNOSIS — R252 Cramp and spasm: Secondary | ICD-10-CM | POA: Diagnosis not present

## 2021-05-20 DIAGNOSIS — D509 Iron deficiency anemia, unspecified: Secondary | ICD-10-CM | POA: Diagnosis not present

## 2021-05-20 DIAGNOSIS — Z79899 Other long term (current) drug therapy: Secondary | ICD-10-CM | POA: Diagnosis not present

## 2021-05-20 LAB — CBC WITH DIFFERENTIAL (CANCER CENTER ONLY)
Abs Immature Granulocytes: 0.08 10*3/uL — ABNORMAL HIGH (ref 0.00–0.07)
Basophils Absolute: 0 10*3/uL (ref 0.0–0.1)
Basophils Relative: 0 %
Eosinophils Absolute: 0.1 10*3/uL (ref 0.0–0.5)
Eosinophils Relative: 1 %
HCT: 33.8 % — ABNORMAL LOW (ref 39.0–52.0)
Hemoglobin: 11.4 g/dL — ABNORMAL LOW (ref 13.0–17.0)
Immature Granulocytes: 1 %
Lymphocytes Relative: 10 %
Lymphs Abs: 0.6 10*3/uL — ABNORMAL LOW (ref 0.7–4.0)
MCH: 32.2 pg (ref 26.0–34.0)
MCHC: 33.7 g/dL (ref 30.0–36.0)
MCV: 95.5 fL (ref 80.0–100.0)
Monocytes Absolute: 0.7 10*3/uL (ref 0.1–1.0)
Monocytes Relative: 12 %
Neutro Abs: 4.7 10*3/uL (ref 1.7–7.7)
Neutrophils Relative %: 76 %
Platelet Count: 207 10*3/uL (ref 150–400)
RBC: 3.54 MIL/uL — ABNORMAL LOW (ref 4.22–5.81)
RDW: 16.6 % — ABNORMAL HIGH (ref 11.5–15.5)
WBC Count: 6.2 10*3/uL (ref 4.0–10.5)
nRBC: 0 % (ref 0.0–0.2)

## 2021-05-20 LAB — CMP (CANCER CENTER ONLY)
ALT: 34 U/L (ref 0–44)
AST: 25 U/L (ref 15–41)
Albumin: 3.7 g/dL (ref 3.5–5.0)
Alkaline Phosphatase: 63 U/L (ref 38–126)
Anion gap: 8 (ref 5–15)
BUN: 18 mg/dL (ref 8–23)
CO2: 26 mmol/L (ref 22–32)
Calcium: 9.6 mg/dL (ref 8.9–10.3)
Chloride: 99 mmol/L (ref 98–111)
Creatinine: 0.89 mg/dL (ref 0.61–1.24)
GFR, Estimated: >60 mL/min (ref >60)
Glucose, Bld: 142 mg/dL — ABNORMAL HIGH (ref 70–99)
Potassium: 4.2 mmol/L (ref 3.5–5.1)
Sodium: 133 mmol/L — ABNORMAL LOW (ref 135–145)
Total Bilirubin: 0.3 mg/dL (ref 0.3–1.2)
Total Protein: 6 g/dL — ABNORMAL LOW (ref 6.5–8.1)

## 2021-05-20 LAB — IRON AND IRON BINDING CAPACITY (CC-WL,HP ONLY)
Iron: 49 ug/dL (ref 45–182)
Saturation Ratios: 14 % — ABNORMAL LOW (ref 17.9–39.5)
TIBC: 349 ug/dL (ref 250–450)
UIBC: 300 ug/dL (ref 117–376)

## 2021-05-20 LAB — FERRITIN: Ferritin: 150 ng/mL (ref 24–336)

## 2021-05-20 MED ORDER — PALONOSETRON HCL INJECTION 0.25 MG/5ML
0.2500 mg | Freq: Once | INTRAVENOUS | Status: AC
  Administered 2021-05-20: 09:00:00 0.25 mg via INTRAVENOUS
  Filled 2021-05-20: qty 5

## 2021-05-20 MED ORDER — ACETAMINOPHEN 325 MG PO TABS
650.0000 mg | ORAL_TABLET | Freq: Once | ORAL | Status: AC
  Administered 2021-05-20: 09:00:00 650 mg via ORAL
  Filled 2021-05-20: qty 2

## 2021-05-20 MED ORDER — SODIUM CHLORIDE 0.9 % IV SOLN
10.0000 mg | Freq: Once | INTRAVENOUS | Status: AC
  Administered 2021-05-20: 09:00:00 10 mg via INTRAVENOUS
  Filled 2021-05-20: qty 10

## 2021-05-20 MED ORDER — FAM-TRASTUZUMAB DERUXTECAN-NXKI CHEMO 100 MG IV SOLR
6.4000 mg/kg | Freq: Once | INTRAVENOUS | Status: AC
  Administered 2021-05-20: 10:00:00 558 mg via INTRAVENOUS
  Filled 2021-05-20: qty 27.9

## 2021-05-20 MED ORDER — ORPHENADRINE CITRATE ER 100 MG PO TB12: 100.0000 mg | ORAL_TABLET | Freq: Every day | ORAL | 2 refills | Status: AC

## 2021-05-20 MED ORDER — SODIUM CHLORIDE 0.9% FLUSH
10.0000 mL | INTRAVENOUS | Status: DC | PRN
  Administered 2021-05-20: 11:00:00 10 mL

## 2021-05-20 MED ORDER — HEPARIN SOD (PORK) LOCK FLUSH 100 UNIT/ML IV SOLN
500.0000 [IU] | Freq: Once | INTRAVENOUS | Status: AC | PRN
  Administered 2021-05-20: 11:00:00 500 [IU]

## 2021-05-20 MED ORDER — DIPHENHYDRAMINE HCL 25 MG PO CAPS
25.0000 mg | ORAL_CAPSULE | Freq: Once | ORAL | Status: AC
  Administered 2021-05-20: 09:00:00 25 mg via ORAL
  Filled 2021-05-20: qty 1

## 2021-05-20 MED ORDER — DEXTROSE 5 % IV SOLN: Freq: Once | INTRAVENOUS | Status: AC

## 2021-05-20 NOTE — Patient Instructions (Signed)

## 2021-05-20 NOTE — Progress Notes (Signed)
Hematology and Oncology Follow Up Visit  REFUJIO HAYMER [id number] March 18, 1956 65 y.o. 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  Current Therapy:   FOLFOX/Nivolumab/Herceptin -- s/p cycle #8-- start on 10/22/2020 --given every 21-day cycles --oxaliplatin dropped on 03/11/2021 -- d/c on 04/20/21 for progression Enhertu -- s/p cycle #1 -- start on 04/27/2021 IV iron given as indicated --Venofer given on 03/31/2021     Interim History:  Mr. Mesa is back for follow-up.  He tolerated first cycle of Enhertu quite nicely.  He felt well.  He had a wonderful Thanksgiving.  He is eating well.  He is having no problems with nausea or vomiting.  He is having no diarrhea.  There is no cough or shortness of breath.  He has had no fever.  I am just happy that he is doing well.  Seems like the neuropathy is not as bad.  He does have leg cramps at nighttime.  We will will try him on some Norflex to see if this might help.  He has had no leg swelling.  There has been no rashes.  Overall, I would say his performance status is ECOG 0.      Medications:  Current Outpatient Medications:    ACCU-CHEK GUIDE test strip, TEST UPTO 4 TIMES A DAY, Disp: 100 strip, Rfl: 11   acetaminophen (TYLENOL) 500 MG tablet, Take 1,000 mg by mouth every 6 (six) hours as needed., Disp: , Rfl:    AMBULATORY NON FORMULARY MEDICATION, Medication Name: Glucometer and strips to test up to once a day. Dx diabetes, Disp: 1 Units, Rfl: 0   ARIPiprazole (ABILIFY) 5 MG tablet, TAKE ONE TABLET (5 MG TOTAL) BY MOUTH DAILY, Disp: 30 tablet, Rfl: 3   atorvastatin (LIPITOR) 40 MG tablet, Take 1 tablet (40 mg total) by mouth at bedtime. Needs labs, Disp: 90 tablet, Rfl: 3   baclofen (LIORESAL) 10 MG tablet, Take 0.5 tablets (5 mg total) by mouth 3 (three) times daily., Disp: 30 each, Rfl: 0   blood glucose meter kit and supplies, Dispense  based on patient and insurance preference. Use up to four times daily as directed. DX: E11.8, Disp: 1 each, Rfl: 0   cilostazol (PLETAL) 100 MG tablet, Take 100 mg by mouth 2 (two) times daily., Disp: , Rfl:    dexamethasone (DECADRON) 4 MG tablet, Take 1 tablet (4 mg total) by mouth 2 (two) times daily with a meal., Disp: 60 tablet, Rfl: 1   dexamethasone (DECADRON) 4 MG tablet, Take 2 tablets (8 mg total) by mouth daily. Start the day after chemotherapy for 2 days., Disp: 30 tablet, Rfl: 1   dicyclomine (BENTYL) 10 MG capsule, TAKE 1 CAPSULE (10 MG TOTAL) BY MOUTH 3 (THREE) TIMES DAILY BEFORE MEALS., Disp: 270 capsule, Rfl: 1   diphenoxylate-atropine (LOMOTIL) 2.5-0.025 MG tablet, TAKE 2 TABLETS BY MOUTH 4 (FOUR) TIMES DAILY AS NEEDED FOR DIARRHEA OR LOOSE STOOLS., Disp: 100 tablet, Rfl: 0   dronabinol (MARINOL) 5 MG capsule, TAKE 1 CAPSULE (5 MG TOTAL) BY MOUTH 2 (TWO) TIMES DAILY BEFORE LUNCH AND SUPPER., Disp: 60 capsule, Rfl: 0   DULoxetine (CYMBALTA) 60 MG capsule, TAKE 1 CAPSULE BY MOUTH TWICE A DAY, Disp: 120 capsule, Rfl: 1   eszopiclone (LUNESTA) 2 MG TABS tablet, Take 1 tablet (2 mg total) by mouth at bedtime as needed., Disp: 30 tablet, Rfl: 5   KLOR-CON M20 20 MEQ tablet, TAKE  2 TABLETS BY MOUTH 2 TIMES DAILY., Disp: 360 tablet, Rfl: 1   levothyroxine (SYNTHROID) 150 MCG tablet, TAKE 1 TABLET BY MOUTH DAILY BEFORE BREAKFAST., Disp: 90 tablet, Rfl: 1   lidocaine-prilocaine (EMLA) cream, Apply to affected area once, Disp: 30 g, Rfl: 3   lisinopril (ZESTRIL) 20 MG tablet, Take 20 mg by mouth daily., Disp: , Rfl:    LORazepam (ATIVAN) 0.5 MG tablet, Take 1 tablet (0.5 mg total) by mouth every 12 (twelve) hours as needed for anxiety., Disp: 30 tablet, Rfl: 0   mirtazapine (REMERON SOL-TAB) 15 MG disintegrating tablet, Take 15 mg by mouth at bedtime., Disp: , Rfl:    NUVIGIL 250 MG tablet, Take 250 mg by mouth daily., Disp: , Rfl:    ondansetron (ZOFRAN) 8 MG tablet, Take 1 tablet (8 mg  total) by mouth 2 (two) times daily as needed for refractory nausea / vomiting. Start on day 3 after chemo., Disp: 30 tablet, Rfl: 1   pantoprazole (PROTONIX) 40 MG tablet, Take 1 tablet by mouth daily., Disp: , Rfl:    pregabalin (LYRICA) 200 MG capsule, Take 1 capsule by mouth 3 (three) times daily., Disp: , Rfl:    prochlorperazine (COMPAZINE) 10 MG tablet, Take 1 tablet (10 mg total) by mouth every 6 (six) hours as needed (Nausea or vomiting)., Disp: 30 tablet, Rfl: 1   RESTASIS 0.05 % ophthalmic emulsion, , Disp: , Rfl:    SODIUM FLUORIDE 5000 PPM 1.1 % PSTE, SMARTSIG:Sparingly Topical Every Night, Disp: , Rfl:    pentoxifylline (TRENTAL) 400 MG CR tablet, Take by mouth. (Patient not taking: Reported on 05/20/2021), Disp: , Rfl:    prednisoLONE acetate (PRED FORTE) 1 % ophthalmic suspension, 1 drop 2 (two) times daily., Disp: , Rfl:   Current Facility-Administered Medications:    dexamethasone (DECADRON) injection 10 mg, 10 mg, Intravenous, Once, Menucha Dicesare, Rudell Cobb, MD  Allergies: No Known Allergies  Past Medical History, Surgical history, Social history, and Family History were reviewed and updated.  Review of Systems: Review of Systems  Constitutional: Negative.   HENT:  Negative.    Eyes: Negative.   Respiratory: Negative.    Cardiovascular: Negative.   Gastrointestinal: Negative.   Endocrine: Negative.   Genitourinary: Negative.    Musculoskeletal: Negative.   Skin: Negative.   Neurological: Negative.   Hematological: Negative.   Psychiatric/Behavioral: Negative.     Physical Exam:  vitals were not taken for this visit.   Wt Readings from Last 3 Encounters:  04/20/21 192 lb (87.1 kg)  03/31/21 180 lb 4 oz (81.8 kg)  03/31/21 180 lb 0.6 oz (81.7 kg)    Physical Exam Vitals reviewed.  HENT:     Head: Normocephalic and atraumatic.  Eyes:     Pupils: Pupils are equal, round, and reactive to light.  Cardiovascular:     Rate and Rhythm: Normal rate and regular rhythm.      Heart sounds: Normal heart sounds.  Pulmonary:     Effort: Pulmonary effort is normal.     Breath sounds: Normal breath sounds.  Abdominal:     General: Bowel sounds are normal.     Palpations: Abdomen is soft.  Musculoskeletal:        General: No tenderness or deformity. Normal range of motion.     Cervical back: Normal range of motion.  Lymphadenopathy:     Cervical: No cervical adenopathy.  Skin:    General: Skin is warm and dry.     Findings: No erythema or  rash.  Neurological:     Mental Status: He is alert and oriented to person, place, and time.  Psychiatric:        Behavior: Behavior normal.        Thought Content: Thought content normal.        Judgment: Judgment normal.     Lab Results  Component Value Date   WBC 9.5 04/27/2021   HGB 11.4 (L) 04/27/2021   HCT 33.5 (L) 04/27/2021   MCV 96.5 04/27/2021   PLT 167 04/27/2021     Chemistry      Component Value Date/Time   NA 134 (L) 04/27/2021 0747   K 3.7 04/27/2021 0747   CL 95 (L) 04/27/2021 0747   CO2 26 04/27/2021 0747   BUN 17 04/27/2021 0747   CREATININE 0.92 04/27/2021 0747   CREATININE 0.86 04/20/2021 0813   CREATININE 0.96 09/05/2020 0000      Component Value Date/Time   CALCIUM 9.3 04/27/2021 0747   ALKPHOS 91 04/27/2021 0747   AST 27 04/27/2021 0747   AST 21 04/20/2021 0813   ALT 29 04/27/2021 0747   ALT 23 04/20/2021 0813   BILITOT 0.4 04/27/2021 0747   BILITOT 0.3 04/20/2021 0813      Impression and Plan: Mr. Kretschmer is a very nice 65 year old white male.  He has metastatic adenocarcinoma of the GE junction..  We initially treated him with FOLFOX.  He did well with this.  He did have some toxicity with the oxaliplatin.  He then began to progress.  We switched over to Enhertu as his tumor is HER2 positive.  We will go second cycle of treatment.  I probably would do 4 cycles of treatment and then rescan him.  I am just happy that his quality of life is doing better.  I know he will  enjoy Christmas.  He did show me his new truck.  I cannot believe it is 65 years old.  It looks so good.  We will plan to get him back in 3 weeks.   [name], MD 12/21/20228:43 AM

## 2021-05-20 NOTE — Addendum Note (Signed)
Addended by: [name] on: 05/20/2021 09:35 AM   Modules accepted: Orders

## 2021-05-20 NOTE — Telephone Encounter (Signed)
-----   Message from [name], MD sent at 05/20/2021  1:32 PM EST ----- Please call and let him know that the iron is on the lower side.  We probably need to give him a dose of IV iron to make sure his blood gets a little better over the holidays.  Thanks.  Laurey Arrow

## 2021-05-20 NOTE — Telephone Encounter (Signed)
Called and informed patient of lab results, patient verbalized understanding and denies any questions or concerns at this time. Message sent to schedulers to set up 1 dose of Venofer per Dr Marin Olp.

## 2021-05-20 NOTE — Patient Instructions (Signed)
[facility]  Discharge Instructions: Thank you for choosing [facility] to provide your oncology and hematology care.   If you have a lab appointment with the Cancer Center, please go directly to the Cancer Center and check in at the registration area.  Wear comfortable clothing and clothing appropriate for easy access to any Portacath or PICC line.   We strive to give you quality time with your provider. You may need to reschedule your appointment if you arrive late (15 or more minutes).  Arriving late affects you and other patients whose appointments are after yours.  Also, if you miss three or more appointments without notifying the office, you may be dismissed from the clinic at the provider's discretion.      For prescription refill requests, have your pharmacy contact our office and allow 72 hours for refills to be completed.    Today you received the following chemotherapy and/or immunotherapy agents Enhertu      To help prevent nausea and vomiting after your treatment, we encourage you to take your nausea medication as directed.  BELOW ARE SYMPTOMS THAT SHOULD BE REPORTED IMMEDIATELY: *FEVER GREATER THAN 100.4 F (38 C) OR HIGHER *CHILLS OR SWEATING *NAUSEA AND VOMITING THAT IS NOT CONTROLLED WITH YOUR NAUSEA MEDICATION *UNUSUAL SHORTNESS OF BREATH *UNUSUAL BRUISING OR BLEEDING *URINARY PROBLEMS (pain or burning when urinating, or frequent urination) *BOWEL PROBLEMS (unusual diarrhea, constipation, pain near the anus) TENDERNESS IN MOUTH AND THROAT WITH OR WITHOUT PRESENCE OF ULCERS (sore throat, sores in mouth, or a toothache) UNUSUAL RASH, SWELLING OR PAIN  UNUSUAL VAGINAL DISCHARGE OR ITCHING   Items with * indicate a potential emergency and should be followed up as soon as possible or go to the Emergency Department if any problems should occur.  Please show the CHEMOTHERAPY ALERT CARD or IMMUNOTHERAPY ALERT CARD at check-in to the  Emergency Department and triage nurse. Should you have questions after your visit or need to cancel or reschedule your appointment, please contact [facility]  [phone] and follow the prompts.  Office hours are 8:00 a.m. to 4:30 p.m. Monday - Friday. Please note that voicemails left after 4:00 p.m. may not be returned until the following business day.  We are closed weekends and major holidays. You have access to a nurse at all times for urgent questions. Please call the main number to the clinic [phone] and follow the prompts.  For any non-urgent questions, you may also contact your provider using MyChart. We now offer e-Visits for anyone 18 and older to request care online for non-urgent symptoms. For details visit [email or web address].   Also download the MyChart app! Go to the app store, search "MyChart", open the app, select [facility], and log in with your MyChart username and password.  Due to Covid, a mask is required upon entering the hospital/clinic. If you do not have a mask, one will be given to you upon arrival. For doctor visits, patients may have 1 support person aged 18 or older with them. For treatment visits, patients cannot have anyone with them due to current Covid guidelines and our immunocompromised population.  

## 2021-05-21 ENCOUNTER — Encounter: Payer: Self-pay | Admitting: Hematology & Oncology

## 2021-05-21 ENCOUNTER — Telehealth: Payer: Self-pay | Admitting: Hematology & Oncology

## 2021-05-21 NOTE — Telephone Encounter (Signed)
Scheduled appt per 12/21 los and per staff message with RN Noah Delaine. Unable to reach patient. Left message for patient that appts where made.

## 2021-05-21 NOTE — Progress Notes (Signed)
Oncology Nurse Navigator Documentation  Oncology Nurse Navigator Flowsheets 05/20/2021  Abnormal Finding Date -  Confirmed Diagnosis Date -  Diagnosis Status -  Phase of Treatment -  Chemotherapy Actual Start Date: -  Chemotherapy Expected End Date: -  Navigator Follow Up Date: 06/09/2021  Navigator Follow Up Reason: Follow-up Appointment;Chemotherapy  Navigator Location [facility]  Navigator Encounter Type Treatment;Appt/Treatment Plan Review  Telephone -  Treatment Initiated Date -  Patient Visit Type MedOnc  Treatment Phase Active Tx  Barriers/Navigation Needs Coordination of Care;Education  Education -  Interventions Psycho-Social Support  Acuity Level 2-Minimal Needs (1-2 Barriers Identified)  Coordination of Care -  Education Method -  Support Groups/Services Friends and Family  Time Spent with Patient 15

## 2021-05-26 ENCOUNTER — Ambulatory Visit (INDEPENDENT_AMBULATORY_CARE_PROVIDER_SITE_OTHER): Payer: Medicare Other | Admitting: Family Medicine

## 2021-05-26 ENCOUNTER — Other Ambulatory Visit: Payer: Self-pay

## 2021-05-26 ENCOUNTER — Encounter: Payer: Self-pay | Admitting: Family Medicine

## 2021-05-26 VITALS — BP 142/70 | HR 70 | Resp 18 | Ht 72.0 in | Wt 200.0 lb

## 2021-05-26 DIAGNOSIS — E039 Hypothyroidism, unspecified: Secondary | ICD-10-CM | POA: Diagnosis not present

## 2021-05-26 DIAGNOSIS — I70213 Atherosclerosis of native arteries of extremities with intermittent claudication, bilateral legs: Secondary | ICD-10-CM

## 2021-05-26 DIAGNOSIS — T451X5A Adverse effect of antineoplastic and immunosuppressive drugs, initial encounter: Secondary | ICD-10-CM | POA: Diagnosis not present

## 2021-05-26 DIAGNOSIS — G620 Drug-induced polyneuropathy: Secondary | ICD-10-CM | POA: Diagnosis not present

## 2021-05-26 DIAGNOSIS — E118 Type 2 diabetes mellitus with unspecified complications: Secondary | ICD-10-CM | POA: Diagnosis not present

## 2021-05-26 LAB — POCT GLYCOSYLATED HEMOGLOBIN (HGB A1C): HbA1c, POC (controlled diabetic range): 6.8 % (ref 0.0–7.0)

## 2021-05-26 NOTE — Progress Notes (Signed)
Established Patient Office Visit  Subjective:  Patient ID: Eddie Hernandez, male    DOB: 1956/04/04  Age: 65 y.o. MRN: [id number]  CC:  Chief Complaint  Patient presents with   Diabetes    Follow up    Foot Numbness    Bilateral     HPI Eddie Hernandez presents for   Diabetes - no hypoglycemic events. No wounds or sores that are not healing well. No increased thirst or urination. Checking glucose at home. Taking medications as prescribed without any side effects.  Has initial chemotherapy treatment he says was really bad he lost a significant amount of weight and basically was sick in bed all the time then they started to space his regimen some.  But recently changed it and he is actually been feeling better and has put weight back on.  He also has bilateral foot numbness from chemotherapy now he says are completely gone.  He still has some pain and is currently on Cymbalta and I just increased his dose to twice a day so he is really just started it.  He is already on Lyrica 3 times a day.  He has been also experiencing a lot of leg cramps.  He also started a CBD cream recently which she has felt has been helpful.  6 now starting to get some numbness and sharp pains in his hands.  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   GERD (gastroesophageal reflux disease)    Hyperlipidemia    Hypertension    Hypothyroidism    Iron deficiency anemia due to chronic blood loss 10/15/2020   PAD (peripheral artery disease) (HCC)    Peripheral neuropathy    small fiber   Prostate pain    Stroke [facility])    Per CT scan - Old - pt was not aware    Tremor     Past Surgical History:  Procedure Laterality Date   brain stimulator removed     COLONOSCOPY     DEEP BRAIN STIMULATOR PLACEMENT  01-30-08   tremors   ILIAC ARTERY STENT     x2   IR IMAGING GUIDED PORT INSERTION   10/21/2020    Family History  Problem Relation Age of Onset   Stroke Father    Alcoholism Father    Cancer Sister        Lung    Colon cancer Neg Hx    Colon polyps Neg Hx    Esophageal cancer Neg Hx    Rectal cancer Neg Hx    Stomach cancer Neg Hx     Social History   Socioeconomic History   Marital status: Married    Spouse name: Eddie Hernandez    Number of children: Not on file   Years of education: Not on file   Highest education level: Not on file  Occupational History   Occupation: works in Scientist, research (medical).      Comment: Eddie Hernandez  Tobacco Use   Smoking status: Former    Packs/day: 1.00    Years: 45.00    Pack years: 45.00    Types: Cigarettes    Quit date: 09/08/2017    Years since quitting: 3.7   Smokeless tobacco: Never  Vaping Use   Vaping Use: Never used  Substance and Sexual Activity   Alcohol use: No  Alcohol/week: 0.0 standard drinks    Comment: hx of EtOH abuse   Drug use: No   Sexual activity: Not on file  Other Topics Concern   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 No active exercise.    Social Determinants of Health   Financial Resource Strain: Not on file  Food Insecurity: Not on file  Transportation Needs: Not on file  Physical Activity: Not on file  Stress: Not on file  Social Connections: Not on file  Intimate Partner Violence: Not on file    Outpatient Medications Prior to Visit  Medication Sig Dispense Refill   ACCU-CHEK GUIDE test strip TEST UPTO 4 TIMES A DAY 100 strip 11   acetaminophen (TYLENOL) 500 MG tablet Take 1,000 mg by mouth every 6 (six) hours as needed.     AMBULATORY NON FORMULARY MEDICATION Medication Name: Glucometer and strips to test up to once a day. Dx diabetes 1 Units 0   ARIPiprazole (ABILIFY) 5 MG tablet TAKE ONE TABLET (5 MG TOTAL) BY MOUTH DAILY 30 tablet 3   atorvastatin (LIPITOR) 40 MG tablet Take 1 tablet (40 mg total) by mouth at bedtime. Needs labs 90 tablet 3   blood  glucose meter kit and supplies Dispense based on patient and insurance preference. Use up to four times daily as directed. DX: E11.8 1 each 0   cilostazol (PLETAL) 100 MG tablet Take 100 mg by mouth 2 (two) times daily.     dexamethasone (DECADRON) 4 MG tablet Take 1 tablet (4 mg total) by mouth 2 (two) times daily with a meal. 60 tablet 1   dicyclomine (BENTYL) 10 MG capsule TAKE 1 CAPSULE (10 MG TOTAL) BY MOUTH 3 (THREE) TIMES DAILY BEFORE MEALS. 270 capsule 1   dronabinol (MARINOL) 5 MG capsule TAKE 1 CAPSULE (5 MG TOTAL) BY MOUTH 2 (TWO) TIMES DAILY BEFORE LUNCH AND SUPPER. 60 capsule 0   DULoxetine (CYMBALTA) 60 MG capsule TAKE 1 CAPSULE BY MOUTH TWICE A DAY 120 capsule 1   eszopiclone (LUNESTA) 2 MG TABS tablet Take 1 tablet (2 mg total) by mouth at bedtime as needed. (Patient taking differently: Take 2 mg by mouth at bedtime as needed. Patient takes 1 tablet at bedtime) 30 tablet 5   KLOR-CON M20 20 MEQ tablet TAKE 2 TABLETS BY MOUTH 2 TIMES DAILY. 360 tablet 1   levothyroxine (SYNTHROID) 150 MCG tablet TAKE 1 TABLET BY MOUTH DAILY BEFORE BREAKFAST. 90 tablet 1   lisinopril (ZESTRIL) 20 MG tablet Take 20 mg by mouth daily.     mirtazapine (REMERON SOL-TAB) 15 MG disintegrating tablet Take 15 mg by mouth at bedtime.     NUVIGIL 250 MG tablet Take 250 mg by mouth daily.     orphenadrine (NORFLEX) 100 MG tablet Take 1 tablet (100 mg total) by mouth at bedtime. 30 tablet 2   pregabalin (LYRICA) 200 MG capsule Take 1 capsule by mouth 3 (three) times daily.     prochlorperazine (COMPAZINE) 10 MG tablet Take 1 tablet (10 mg total) by mouth every 6 (six) hours as needed (Nausea or vomiting). 30 tablet 1   SODIUM FLUORIDE 5000 PPM 1.1 % PSTE SMARTSIG:Sparingly Topical Every Night     baclofen (LIORESAL) 10 MG tablet Take 0.5 tablets (5 mg total) by mouth 3 (three) times daily. 30 each 0   dexamethasone (DECADRON) 4 MG tablet Take 2 tablets (8 mg total) by mouth daily. Start the day after  chemotherapy  for 2 days. (Patient not taking: Reported on 05/26/2021) 30 tablet 1   diphenoxylate-atropine (LOMOTIL) 2.5-0.025 MG tablet TAKE 2 TABLETS BY MOUTH 4 (FOUR) TIMES DAILY AS NEEDED FOR DIARRHEA OR LOOSE STOOLS. (Patient not taking: Reported on 05/26/2021) 100 tablet 0   lidocaine-prilocaine (EMLA) cream Apply to affected area once 30 g 3   LORazepam (ATIVAN) 0.5 MG tablet Take 1 tablet (0.5 mg total) by mouth every 12 (twelve) hours as needed for anxiety. (Patient not taking: Reported on 05/26/2021) 30 tablet 0   ondansetron (ZOFRAN) 8 MG tablet Take 1 tablet (8 mg total) by mouth 2 (two) times daily as needed for refractory nausea / vomiting. Start on day 3 after chemo. (Patient not taking: Reported on 05/26/2021) 30 tablet 1   pantoprazole (PROTONIX) 40 MG tablet Take 1 tablet by mouth daily. (Patient not taking: Reported on 05/26/2021)     pentoxifylline (TRENTAL) 400 MG CR tablet Take by mouth. (Patient not taking: Reported on 05/20/2021)     prednisoLONE acetate (PRED FORTE) 1 % ophthalmic suspension 1 drop 2 (two) times daily.     RESTASIS 0.05 % ophthalmic emulsion      Facility-Administered Medications Prior to Visit  Medication Dose Route Frequency Provider Last Rate Last Admin   dexamethasone (DECADRON) injection 10 mg  10 mg Intravenous Once Ennever, Rudell Cobb, MD        No Known Allergies  ROS Review of Systems    Objective:    Physical Exam Constitutional:      Appearance: Normal appearance. He is well-developed.  HENT:     Head: Normocephalic and atraumatic.  Cardiovascular:     Rate and Rhythm: Normal rate and regular rhythm.     Heart sounds: Normal heart sounds.  Pulmonary:     Effort: Pulmonary effort is normal.     Breath sounds: Normal breath sounds.  Skin:    General: Skin is warm and dry.  Neurological:     Mental Status: He is alert and oriented to person, place, and time. Mental status is at baseline.  Psychiatric:        Behavior: Behavior normal.     There were no vitals taken for this visit. Wt Readings from Last 3 Encounters:  05/20/21 202 lb (91.6 kg)  04/20/21 192 lb (87.1 kg)  03/31/21 180 lb 4 oz (81.8 kg)     There are no preventive care reminders to display for this patient.   There are no preventive care reminders to display for this patient.  Lab Results  Component Value Date   TSH 0.248 (L) 04/20/2021   Lab Results  Component Value Date   WBC 6.2 05/20/2021   HGB 11.4 (L) 05/20/2021   HCT 33.8 (L) 05/20/2021   MCV 95.5 05/20/2021   PLT 207 05/20/2021   Lab Results  Component Value Date   NA 133 (L) 05/20/2021   K 4.2 05/20/2021   CO2 26 05/20/2021   GLUCOSE 142 (H) 05/20/2021   BUN 18 05/20/2021   CREATININE 0.89 05/20/2021   BILITOT 0.3 05/20/2021   ALKPHOS 63 05/20/2021   AST 25 05/20/2021   ALT 34 05/20/2021   PROT 6.0 (L) 05/20/2021   ALBUMIN 3.7 05/20/2021   CALCIUM 9.6 05/20/2021   ANIONGAP 8 05/20/2021   Lab Results  Component Value Date   CHOL 111 03/05/2020   Lab Results  Component Value Date   HDL 44 03/05/2020   Lab Results  Component Value Date   LDLCALC  50 03/05/2020   Lab Results  Component Value Date   TRIG 91 03/05/2020   Lab Results  Component Value Date   CHOLHDL 2.5 03/05/2020   Lab Results  Component Value Date   HGBA1C 6.8 05/26/2021      Assessment & Plan:   Problem List Items Addressed This Visit       Endocrine   Hypothyroidism    TSH looked good.  We will continue to keep an eye on it.      Controlled diabetes mellitus type 2 with complications (HCC) - Primary    A1c actually looks great at 6.8.  We will continue current regimen but he has been indulging a bit lately so just encouraged him to continue to make moderate but healthy choices.  He had and stay active he says he really would like to get his weight back down to 185.      Relevant Orders   POCT glycosylated hemoglobin (Hb A1C) (Completed)     Nervous and Auditory   Neuropathy  due to chemotherapeutic drug ([place])    They just increase the Cymbalta to twice a day I think it is worth at least trying it for 4 to 6 weeks to see if he notices any improvement.  If it is not helpful then he is okay to drop back down to once a day.  He is already on Lyrica 3 times a day.  Just reminded him that its not really can help with the numbness it is only can help with pain or discomfort he seems to be getting some improvement with the topical CBD cream which I think is reasonable to use and just recently added another medication for the cramps about 3 days ago and that seems to be helping as well.       No orders of the defined types were placed in this encounter.   Follow-up: Return in about 3 months (around 08/31/2021) for Diabetes follow-up.    Beatrice Lecher, MD

## 2021-05-26 NOTE — Assessment & Plan Note (Signed)
TSH looked good.  We will continue to keep an eye on it.

## 2021-05-26 NOTE — Assessment & Plan Note (Signed)
They just increase the Cymbalta to twice a day I think it is worth at least trying it for 4 to 6 weeks to see if he notices any improvement.  If it is not helpful then he is okay to drop back down to once a day.  He is already on Lyrica 3 times a day.  Just reminded him that its not really can help with the numbness it is only can help with pain or discomfort he seems to be getting some improvement with the topical CBD cream which I think is reasonable to use and just recently added another medication for the cramps about 3 days ago and that seems to be helping as well.

## 2021-05-26 NOTE — Assessment & Plan Note (Signed)
A1c actually looks great at 6.8.  We will continue current regimen but he has been indulging a bit lately so just encouraged him to continue to make moderate but healthy choices.  He had and stay active he says he really would like to get his weight back down to 185.

## 2021-06-01 ENCOUNTER — Other Ambulatory Visit: Payer: Self-pay

## 2021-06-01 ENCOUNTER — Emergency Department (HOSPITAL_COMMUNITY): Payer: Medicare Other

## 2021-06-01 ENCOUNTER — Emergency Department (HOSPITAL_COMMUNITY)
Admission: EM | Admit: 2021-06-01 | Discharge: 2021-06-01 | Disposition: A | Discharge status: Home / Self Care | Payer: Medicare Other | Attending: Emergency Medicine | Admitting: Emergency Medicine

## 2021-06-01 ENCOUNTER — Encounter (HOSPITAL_COMMUNITY): Payer: Self-pay

## 2021-06-01 DIAGNOSIS — Z5321 Procedure and treatment not carried out due to patient leaving prior to being seen by health care provider: Secondary | ICD-10-CM | POA: Insufficient documentation

## 2021-06-01 DIAGNOSIS — R0602 Shortness of breath: Secondary | ICD-10-CM | POA: Diagnosis present

## 2021-06-01 LAB — CBC WITH DIFFERENTIAL/PLATELET
Abs Immature Granulocytes: 0.1 10*3/uL — ABNORMAL HIGH (ref 0.00–0.07)
Basophils Absolute: 0 10*3/uL (ref 0.0–0.1)
Basophils Relative: 0 %
Eosinophils Absolute: 0 10*3/uL (ref 0.0–0.5)
Eosinophils Relative: 0 %
HCT: 34.9 % — ABNORMAL LOW (ref 39.0–52.0)
Hemoglobin: 11.7 g/dL — ABNORMAL LOW (ref 13.0–17.0)
Immature Granulocytes: 1 %
Lymphocytes Relative: 2 %
Lymphs Abs: 0.2 10*3/uL — ABNORMAL LOW (ref 0.7–4.0)
MCH: 31.7 pg (ref 26.0–34.0)
MCHC: 33.5 g/dL (ref 30.0–36.0)
MCV: 94.6 fL (ref 80.0–100.0)
Monocytes Absolute: 0.3 10*3/uL (ref 0.1–1.0)
Monocytes Relative: 3 %
Neutro Abs: 10.1 10*3/uL — ABNORMAL HIGH (ref 1.7–7.7)
Neutrophils Relative %: 94 %
Platelets: 197 10*3/uL (ref 150–400)
RBC: 3.69 MIL/uL — ABNORMAL LOW (ref 4.22–5.81)
RDW: 16.9 % — ABNORMAL HIGH (ref 11.5–15.5)
WBC: 10.8 10*3/uL — ABNORMAL HIGH (ref 4.0–10.5)
nRBC: 0 % (ref 0.0–0.2)

## 2021-06-01 LAB — COMPREHENSIVE METABOLIC PANEL
ALT: 39 U/L (ref 0–44)
AST: 30 U/L (ref 15–41)
Albumin: 3.5 g/dL (ref 3.5–5.0)
Alkaline Phosphatase: 82 U/L (ref 38–126)
Anion gap: 8 (ref 5–15)
BUN: 28 mg/dL — ABNORMAL HIGH (ref 8–23)
CO2: 22 mmol/L (ref 22–32)
Calcium: 9.1 mg/dL (ref 8.9–10.3)
Chloride: 101 mmol/L (ref 98–111)
Creatinine, Ser: 0.92 mg/dL (ref 0.61–1.24)
GFR, Estimated: >60 mL/min (ref >60)
Glucose, Bld: 246 mg/dL — ABNORMAL HIGH (ref 70–99)
Potassium: 4.2 mmol/L (ref 3.5–5.1)
Sodium: 131 mmol/L — ABNORMAL LOW (ref 135–145)
Total Bilirubin: 0.6 mg/dL (ref 0.3–1.2)
Total Protein: 6.5 g/dL (ref 6.5–8.1)

## 2021-06-01 NOTE — ED Provider Notes (Signed)
Emergency Medicine Provider Triage Evaluation Note  Eddie Hernandez , a 66 y.o. male  with hx of emphysema, GE junction carcinoma with most recent 05/20/2021, PAD and stroke also was evaluated in triage.  Pt complains of exertional SOB starting about 1 week ago and worsening since onset. Pt states that he normally walks about a mile to a mile and a half daily, but now he has become short of breath when he is walking around his home.  He denies recent illness and denies fevers, chills, cough, abdominal pain, N/V/D.   Review of Systems  Positive: SOB Negative: As above  Physical Exam  BP 130/79 (BP Location: Left Arm)    Pulse (!) 104    Temp 97.9 F (36.6 C) (Oral)    Resp 18    Ht 6' (1.829 m)    Wt 88.5 kg    SpO2 96%    BMI 26.45 kg/m  Gen:   Awake, no distress   Resp:  Normal effort, although he did a need a break walking from lobby to [facility] room  MSK:   Moves extremities without difficulty  Other:    Medical Decision Making  Medically screening exam initiated at 4:09 PM.  Appropriate orders placed.  Sigurd Sos was informed that the remainder of the evaluation will be completed by another provider, this initial triage assessment does not replace that evaluation, and the importance of remaining in the ED until their evaluation is complete.     Tonye Pearson, [place] 06/01/21 1613    Daleen Bo, MD 06/03/21 1147

## 2021-06-01 NOTE — ED Triage Notes (Signed)
Pt reports increasing SHOB and chest tightness over the past few days especially with exertion. Hx of esophageal cancer and most recent chemo treatment on 12/21.

## 2021-06-02 ENCOUNTER — Telehealth: Payer: Self-pay | Admitting: *Deleted

## 2021-06-02 ENCOUNTER — Other Ambulatory Visit: Payer: Self-pay | Admitting: Family

## 2021-06-02 ENCOUNTER — Telehealth: Payer: Self-pay | Admitting: General Practice

## 2021-06-02 DIAGNOSIS — R0602 Shortness of breath: Secondary | ICD-10-CM

## 2021-06-02 DIAGNOSIS — R059 Cough, unspecified: Secondary | ICD-10-CM

## 2021-06-02 DIAGNOSIS — D500 Iron deficiency anemia secondary to blood loss (chronic): Secondary | ICD-10-CM

## 2021-06-02 DIAGNOSIS — R000 Tachycardia, unspecified: Secondary | ICD-10-CM

## 2021-06-02 NOTE — Telephone Encounter (Signed)
Received a call from patient stating that he went to the ED yesterday with increasing SOB but was told it was a 12 hour wait so he left.  Reviewed chart and notes from ED with Dr Marin Olp. CXR was done and negative.  O2 sats were 96.  Dr Marin Olp states he will put in an order for CT angio.  Patient aware.

## 2021-06-02 NOTE — Telephone Encounter (Signed)
Transition Care Management Follow-up Telephone Call Date of discharge and from where: 06/01/21 from [facility] How have you been since you were released from the hospital? Still having shortness of breath, feeling very fatigue and tired. He has called the oncologist and they are going to schedule him for a CT scan. He states his Iron is very low and they are working on getting that approved. He did not want to go the ER today. If he is still having the same symptoms, he will [facility]. Any questions or concerns? No  Items Reviewed: Did the pt receive and understand the discharge instructions provided? Yes  Medications obtained and verified? Yes  Other? No  Any new allergies since your discharge? No  Dietary orders reviewed? Yes Do you have support at home? Yes   Home Care and Equipment/Supplies: Were home health services ordered? no  Functional Questionnaire: (I = Independent and D = Dependent) ADLs: I  Bathing/Dressing- I  Meal Prep- I  Eating- I  Maintaining continence- I  Transferring/Ambulation- I  Managing Meds- I  Follow up appointments reviewed:  PCP Hospital f/u appt confirmed? Yes  Scheduled to see Dr. Madilyn Fireman on 06/10/21 @ 0830. [facility] f/u appt confirmed? No   Are transportation arrangements needed? No  If their condition worsens, is the pt aware to call PCP or go to the Emergency Dept.? Yes Was the patient provided with contact information for the PCP's office or ED? Yes Was to pt encouraged to call back with questions or concerns? Yes

## 2021-06-02 NOTE — Telephone Encounter (Signed)
Transition Care Management Unsuccessful Follow-up Telephone Call  Date of discharge and from where:  06/01/21 from [facility]  Attempts:  1st Attempt  Reason for unsuccessful TCM follow-up call:  Left voice message

## 2021-06-03 ENCOUNTER — Other Ambulatory Visit: Payer: Self-pay

## 2021-06-03 ENCOUNTER — Encounter (HOSPITAL_BASED_OUTPATIENT_CLINIC_OR_DEPARTMENT_OTHER): Payer: Self-pay | Admitting: Emergency Medicine

## 2021-06-03 ENCOUNTER — Telehealth: Payer: Self-pay | Admitting: General Practice

## 2021-06-03 ENCOUNTER — Other Ambulatory Visit (HOSPITAL_BASED_OUTPATIENT_CLINIC_OR_DEPARTMENT_OTHER): Payer: Self-pay

## 2021-06-03 ENCOUNTER — Encounter: Payer: Self-pay | Admitting: Hematology & Oncology

## 2021-06-03 ENCOUNTER — Emergency Department (HOSPITAL_BASED_OUTPATIENT_CLINIC_OR_DEPARTMENT_OTHER): Payer: Medicare Other

## 2021-06-03 ENCOUNTER — Emergency Department (HOSPITAL_BASED_OUTPATIENT_CLINIC_OR_DEPARTMENT_OTHER)
Admission: EM | Admit: 2021-06-03 | Discharge: 2021-06-03 | Disposition: A | Discharge status: Home / Self Care | Payer: Medicare Other | Attending: Emergency Medicine | Admitting: Emergency Medicine

## 2021-06-03 DIAGNOSIS — Z20822 Contact with and (suspected) exposure to covid-19: Secondary | ICD-10-CM | POA: Insufficient documentation

## 2021-06-03 DIAGNOSIS — J189 Pneumonia, unspecified organism: Secondary | ICD-10-CM

## 2021-06-03 DIAGNOSIS — R0602 Shortness of breath: Secondary | ICD-10-CM | POA: Diagnosis present

## 2021-06-03 DIAGNOSIS — R079 Chest pain, unspecified: Secondary | ICD-10-CM | POA: Insufficient documentation

## 2021-06-03 DIAGNOSIS — Z79899 Other long term (current) drug therapy: Secondary | ICD-10-CM | POA: Diagnosis not present

## 2021-06-03 LAB — COMPREHENSIVE METABOLIC PANEL
ALT: 31 U/L (ref 0–44)
AST: 25 U/L (ref 15–41)
Albumin: 3.1 g/dL — ABNORMAL LOW (ref 3.5–5.0)
Alkaline Phosphatase: 70 U/L (ref 38–126)
Anion gap: 9 (ref 5–15)
BUN: 20 mg/dL (ref 8–23)
CO2: 22 mmol/L (ref 22–32)
Calcium: 8.4 mg/dL — ABNORMAL LOW (ref 8.9–10.3)
Chloride: 102 mmol/L (ref 98–111)
Creatinine, Ser: 0.81 mg/dL (ref 0.61–1.24)
GFR, Estimated: >60 mL/min (ref >60)
Glucose, Bld: 150 mg/dL — ABNORMAL HIGH (ref 70–99)
Potassium: 3.5 mmol/L (ref 3.5–5.1)
Sodium: 133 mmol/L — ABNORMAL LOW (ref 135–145)
Total Bilirubin: 0.3 mg/dL (ref 0.3–1.2)
Total Protein: 6.1 g/dL — ABNORMAL LOW (ref 6.5–8.1)

## 2021-06-03 LAB — CBC WITH DIFFERENTIAL/PLATELET
Abs Immature Granulocytes: 0.09 10*3/uL — ABNORMAL HIGH (ref 0.00–0.07)
Basophils Absolute: 0 10*3/uL (ref 0.0–0.1)
Basophils Relative: 0 %
Eosinophils Absolute: 0.1 10*3/uL (ref 0.0–0.5)
Eosinophils Relative: 1 %
HCT: 32.8 % — ABNORMAL LOW (ref 39.0–52.0)
Hemoglobin: 11.3 g/dL — ABNORMAL LOW (ref 13.0–17.0)
Immature Granulocytes: 1 %
Lymphocytes Relative: 6 %
Lymphs Abs: 0.4 10*3/uL — ABNORMAL LOW (ref 0.7–4.0)
MCH: 32.2 pg (ref 26.0–34.0)
MCHC: 34.5 g/dL (ref 30.0–36.0)
MCV: 93.4 fL (ref 80.0–100.0)
Monocytes Absolute: 0.4 10*3/uL (ref 0.1–1.0)
Monocytes Relative: 6 %
Neutro Abs: 5.7 10*3/uL (ref 1.7–7.7)
Neutrophils Relative %: 86 %
Platelets: 203 10*3/uL (ref 150–400)
RBC: 3.51 MIL/uL — ABNORMAL LOW (ref 4.22–5.81)
RDW: 16.9 % — ABNORMAL HIGH (ref 11.5–15.5)
WBC: 6.7 10*3/uL (ref 4.0–10.5)
nRBC: 0 % (ref 0.0–0.2)

## 2021-06-03 LAB — RESP PANEL BY RT-PCR (FLU A&B, COVID) ARPGX2
Influenza A by PCR: NEGATIVE
Influenza B by PCR: NEGATIVE
SARS Coronavirus 2 by RT PCR: NEGATIVE

## 2021-06-03 LAB — TROPONIN I (HIGH SENSITIVITY)
Troponin I (High Sensitivity): 5 ng/L (ref <18)
Troponin I (High Sensitivity): 4 ng/L (ref <18)

## 2021-06-03 MED ORDER — AZITHROMYCIN 250 MG PO TABS: 250.0000 mg | ORAL_TABLET | Freq: Every day | ORAL | 0 refills | Status: AC

## 2021-06-03 MED ORDER — IOHEXOL 350 MG/ML SOLN
100.0000 mL | Freq: Once | INTRAVENOUS | Status: AC | PRN
Start: 2021-06-03 — End: 2021-06-03
  Administered 2021-06-03: 100 mL via INTRAVENOUS

## 2021-06-03 MED ORDER — CEPHALEXIN 500 MG PO CAPS: 500.0000 mg | ORAL_CAPSULE | Freq: Three times a day (TID) | ORAL | 0 refills | Status: AC

## 2021-06-03 NOTE — ED Notes (Signed)
Patient transported to CT 

## 2021-06-03 NOTE — ED Provider Notes (Signed)
[facility] EMERGENCY DEPARTMENT Provider Note   CSN: [id number] Arrival date & time: 06/03/21  0428     History  Chief Complaint  Patient presents with   Shortness of Breath    Eddie Hernandez is a 66 y.o. male.  The history is provided by the patient and medical records.  Shortness of Breath KHRYSTIAN SCHAUF is a 66 y.o. male who presents to the Emergency Department complaining of sob.  He presents to the ED for evaluation of one week of sob.  Has two days of left central chest pain, pressure sensation , comes and goes.  Has significant DOE.    Had a fever to 100.6 one week ago.  Has a cough productive of occasional sputum.  No hemoptysis.    No nasal congestion, sore throat.  No N/V/D.  No lower extremity edema.    Went to [facility] two days ago and left without completing evaluation due to wait.      Home Medications Prior to Admission medications   Medication Sig Start Date End Date Taking? Authorizing Provider  ACCU-CHEK GUIDE test strip TEST UPTO 4 TIMES A DAY 02/23/21   Hali Marry, MD  acetaminophen (TYLENOL) 500 MG tablet Take 1,000 mg by mouth every 6 (six) hours as needed.    [provider]  AMBULATORY NON FORMULARY MEDICATION Medication Name: Glucometer and strips to test up to once a day. Dx diabetes 01/21/21   Hali Marry, MD  ARIPiprazole (ABILIFY) 5 MG tablet TAKE ONE TABLET (5 MG TOTAL) BY MOUTH DAILY 07/21/16   Hali Marry, MD  atorvastatin (LIPITOR) 40 MG tablet Take 1 tablet (40 mg total) by mouth at bedtime. Needs labs 02/23/21   Hali Marry, MD  blood glucose meter kit and supplies Dispense based on patient and insurance preference. Use up to four times daily as directed. DX: E11.8 01/30/21   Hali Marry, MD  cilostazol (PLETAL) 100 MG tablet Take 100 mg by mouth 2 (two) times daily. 09/03/20   [provider]  dexamethasone (DECADRON) 4 MG tablet Take 1 tablet (4 mg total) by mouth 2  (two) times daily with a meal. 03/25/21   Celso Amy, NP  dexamethasone (DECADRON) 4 MG tablet Take 2 tablets (8 mg total) by mouth daily. Start the day after chemotherapy for 2 days. Patient not taking: Reported on 05/26/2021 04/27/21   [name], MD  dicyclomine (BENTYL) 10 MG capsule TAKE 1 CAPSULE (10 MG TOTAL) BY MOUTH 3 (THREE) TIMES DAILY BEFORE MEALS. 01/23/21   Hali Marry, MD  diphenoxylate-atropine (LOMOTIL) 2.5-0.025 MG tablet TAKE 2 TABLETS BY MOUTH 4 (FOUR) TIMES DAILY AS NEEDED FOR DIARRHEA OR LOOSE STOOLS. Patient not taking: Reported on 05/26/2021 03/24/21   [name], MD  dronabinol (MARINOL) 5 MG capsule TAKE 1 CAPSULE (5 MG TOTAL) BY MOUTH 2 (TWO) TIMES DAILY BEFORE LUNCH AND SUPPER. 04/25/21   [name], MD  DULoxetine (CYMBALTA) 60 MG capsule TAKE 1 CAPSULE BY MOUTH TWICE A DAY 04/27/21   [name], MD  eszopiclone (LUNESTA) 2 MG TABS tablet Take 1 tablet (2 mg total) by mouth at bedtime as needed. Patient taking differently: Take 2 mg by mouth at bedtime as needed. Patient takes 1 tablet at bedtime 02/23/21   Hali Marry, MD  KLOR-CON M20 20 MEQ tablet TAKE 2 TABLETS BY MOUTH 2 TIMES DAILY. 03/02/21   [name], MD  levothyroxine (SYNTHROID) 150  MCG tablet TAKE 1 TABLET BY MOUTH DAILY BEFORE BREAKFAST. 03/02/21   [name], MD  lidocaine-prilocaine (EMLA) cream Apply to affected area once 04/27/21   [name], MD  lisinopril (ZESTRIL) 20 MG tablet Take 20 mg by mouth daily. 12/21/19   Powers, Elyse Jarvis, MD  LORazepam (ATIVAN) 0.5 MG tablet Take 1 tablet (0.5 mg total) by mouth every 12 (twelve) hours as needed for anxiety. Patient not taking: Reported on 05/26/2021 03/25/21   Celso Amy, NP  mirtazapine (REMERON SOL-TAB) 15 MG disintegrating tablet Take 15 mg by mouth at bedtime. 03/30/21   [provider]  NUVIGIL 250 MG tablet Take 250 mg by mouth daily. 03/18/15   [provider]   ondansetron (ZOFRAN) 8 MG tablet Take 1 tablet (8 mg total) by mouth 2 (two) times daily as needed for refractory nausea / vomiting. Start on day 3 after chemo. Patient not taking: Reported on 05/26/2021 04/27/21   [name], MD  orphenadrine (NORFLEX) 100 MG tablet Take 1 tablet (100 mg total) by mouth at bedtime. 05/20/21   [name], MD  pantoprazole (PROTONIX) 40 MG tablet Take 1 tablet by mouth daily. Patient not taking: Reported on 05/26/2021 08/29/20   [provider]  pentoxifylline (TRENTAL) 400 MG CR tablet Take by mouth. Patient not taking: Reported on 05/20/2021 08/07/19   [provider]  pregabalin (LYRICA) 200 MG capsule Take 1 capsule by mouth 3 (three) times daily. 07/24/19   [provider]  prochlorperazine (COMPAZINE) 10 MG tablet Take 1 tablet (10 mg total) by mouth every 6 (six) hours as needed (Nausea or vomiting). 04/27/21   [name], MD  SODIUM FLUORIDE 5000 PPM 1.1 % PSTE SMARTSIG:Sparingly Topical Every Night 08/28/20   [provider]      Allergies    Patient has no known allergies.    Review of Systems   Review of Systems  Respiratory:  Positive for shortness of breath.   All other systems reviewed and are negative.  Physical Exam Updated Vital Signs BP 118/74    Pulse 89    Temp 98 F (36.7 C) (Oral)    Resp 19    Ht 6' (1.829 m)    Wt 88.5 kg    SpO2 98%    BMI 26.45 kg/m  Physical Exam Vitals and nursing note reviewed.  Constitutional:      Appearance: He is well-developed.  HENT:     Head: Normocephalic and atraumatic.  Cardiovascular:     Rate and Rhythm: Normal rate and regular rhythm.     Heart sounds: No murmur heard. Pulmonary:     Effort: Pulmonary effort is normal. No respiratory distress.     Breath sounds: Normal breath sounds.  Abdominal:     Palpations: Abdomen is soft.     Tenderness: There is no abdominal tenderness. There is no guarding or rebound.  Musculoskeletal:         General: No swelling or tenderness.  Skin:    General: Skin is warm and dry.  Neurological:     Mental Status: He is alert and oriented to person, place, and time.  Psychiatric:        Behavior: Behavior normal.    ED Results / Procedures / Treatments   Labs (all labs ordered are listed, but only abnormal results are displayed) Labs Reviewed  CBC WITH DIFFERENTIAL/PLATELET - Abnormal; Notable for the following components:      Result Value  RBC 3.51 (*)    Hemoglobin 11.3 (*)    HCT 32.8 (*)    RDW 16.9 (*)    Lymphs Abs 0.4 (*)    Abs Immature Granulocytes 0.09 (*)    All other components within normal limits  COMPREHENSIVE METABOLIC PANEL - Abnormal; Notable for the following components:   Sodium 133 (*)    Glucose, Bld 150 (*)    Calcium 8.4 (*)    Total Protein 6.1 (*)    Albumin 3.1 (*)    All other components within normal limits  RESP PANEL BY RT-PCR (FLU A&B, COVID) [id number]  TROPONIN I (HIGH SENSITIVITY)    EKG None  Radiology DG Chest 2 View  Result Date: 06/01/2021 CLINICAL DATA:  Short of breath, chest tightness for several days, history of esophageal cancer EXAM: CHEST - 2 VIEW COMPARISON:  02/19/2021 FINDINGS: Frontal and lateral views of the chest demonstrate stable left chest wall port, tip overlying superior vena cava. Abandoned leads are seen within the right anterior chest unchanged. Cardiac silhouette is unremarkable. No acute airspace disease, effusion, or pneumothorax. There are no acute bony abnormalities. IMPRESSION: 1. No acute intrathoracic process. Electronically Signed   By: Randa Ngo M.D.   On: 06/01/2021 15:25    Procedures Procedures    Medications Ordered in ED Medications - No data to display  ED Course/ Medical Decision Making/ A&P                           Medical Decision Making  Pt with hx/o GE junction carcinoma currently undergoing chemotherapy here for evaluation of progressive SOB.  He is nontoxic appearing on evaluation  without respiratory distress.  CXR without acute disease.  He has associated CP, troponin negative.  Plan to obtain CTA to rule out PE given his active cancer and respiratory sxs.  Pt care transferred pending CTA.          Final Clinical Impression(s) / ED Diagnoses Final diagnoses:  None    Rx / DC Orders ED Discharge Orders     None         Quintella Reichert, MD 06/03/21 2247

## 2021-06-03 NOTE — ED Triage Notes (Signed)
SOB X 1 week worse last couple days, seen at [facility] and was told it would be an additional 12 hours pt left. Pt called PCP and oncologist wast told to go back to ED for reveal.

## 2021-06-03 NOTE — ED Provider Notes (Signed)
°  Physical Exam  BP 129/81    Pulse 73    Temp 98 F (36.7 C) (Oral)    Resp 20    Ht 6' (1.829 m)    Wt 88.5 kg    SpO2 98%    BMI 26.45 kg/m   Physical Exam  ED Course/Procedures     Procedures  MDM  Received care of patient from Dr. Ralene Bathe.  Please see her note for prior history, physical and care.  Briefly is a 66 year old male followed by Dr. Marin Olp who presents with concern for shortness of breath.  CT completed shows no evidence of pulmonary embolus.  Does show new bilateral groundglass and solid appearing nodules, and by clinical history do sound like they could be infectious or inflammatory.  Recommend treatment with antibiotics.  He is stable for outpatient treatment of his suspected lung infection.  Discussed need for close follow-up and repeat CT to exclude metastatic disease.       Gareth Morgan, MD 06/03/21 [phone]

## 2021-06-03 NOTE — Telephone Encounter (Signed)
Transition Care Management Follow-up Telephone Call Date of discharge and from where: 06/03/21 From [place] How have you been since you were released from the hospital? Patient was diagnosed with pneumonia and started antibiotics. Any questions or concerns? No  Items Reviewed: Did the pt receive and understand the discharge instructions provided? Yes  Medications obtained and verified? Yes  Other? No  Any new allergies since your discharge? No  Dietary orders reviewed? Yes Do you have support at home? Yes   Home Care and Equipment/Supplies: Were home health services ordered? no  Functional Questionnaire: (I = Independent and D = Dependent) ADLs: I  Bathing/Dressing- I  Meal Prep- I  Eating- I  Maintaining continence- I  Transferring/Ambulation- I  Managing Meds- I  Follow up appointments reviewed:  PCP Hospital f/u appt confirmed? Yes  Scheduled to see Dr. Madilyn Fireman on 06/10/21 @ 0830. [facility] f/u appt confirmed? No   Are transportation arrangements needed? No  If their condition worsens, is the pt aware to call PCP or go to the Emergency Dept.? Yes Was the patient provided with contact information for the PCP's office or ED? Yes Was to pt encouraged to call back with questions or concerns? Yes

## 2021-06-05 ENCOUNTER — Encounter: Payer: Self-pay | Admitting: Hematology & Oncology

## 2021-06-09 ENCOUNTER — Inpatient Hospital Stay: Payer: Medicare Other

## 2021-06-09 ENCOUNTER — Encounter: Payer: Self-pay | Admitting: *Deleted

## 2021-06-09 ENCOUNTER — Inpatient Hospital Stay: Payer: Medicare Other | Admitting: Hematology & Oncology

## 2021-06-09 NOTE — Progress Notes (Signed)
Patient was scheduled for followup and possible treatment today, however he was seen in the ED and then admitted to [facility]. Today's appointments have been cancelled. Will follow for post discharge needs and follow up.   Oncology Nurse Navigator Documentation  Oncology Nurse Navigator Flowsheets 06/09/2021  Abnormal Finding Date -  Confirmed Diagnosis Date -  Diagnosis Status -  Phase of Treatment -  Chemotherapy Actual Start Date: -  Chemotherapy Expected End Date: -  Navigator Follow Up Date: 06/15/2021  Navigator Follow Up Reason: Appointment Review  Navigator Location [facility]  Navigator Encounter Type Appt/Treatment Plan Review  Telephone -  Treatment Initiated Date -  Patient Visit Type MedOnc  Treatment Phase Active Tx  Barriers/Navigation Needs Coordination of Care;Education  Education -  Interventions None Required  Acuity Level 2-Minimal Needs (1-2 Barriers Identified)  Coordination of Care -  Education Method -  Support Groups/Services Friends and Family  Time Spent with Patient 15

## 2021-06-10 ENCOUNTER — Inpatient Hospital Stay: Payer: BLUE CROSS/BLUE SHIELD | Admitting: Family Medicine

## 2021-06-10 NOTE — Progress Notes (Deleted)
Established Patient Office Visit  Subjective:  Patient ID: Eddie Hernandez, male    DOB: August 01, 1955  Age: 66 y.o. MRN: [id number]  CC: No chief complaint on file.   HPI Eddie Hernandez presents for hospital f/u for SOB.    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   GERD (gastroesophageal reflux disease)    Hyperlipidemia    Hypertension    Hypothyroidism    Iron deficiency anemia due to chronic blood loss 10/15/2020   PAD (peripheral artery disease) (H[facility])    Peripheral neuropathy    small fiber   Prostate pain    Stroke [facility])    Per CT scan - Old - pt was not aware    Tremor     Past Surgical History:  Procedure Laterality Date   brain stimulator removed     COLONOSCOPY     DEEP BRAIN STIMULATOR PLACEMENT  01-30-08   tremors   ILIAC ARTERY STENT     x2   IR IMAGING GUIDED PORT INSERTION  10/21/2020    Family History  Problem Relation Age of Onset   Stroke Father    Alcoholism Father    Cancer Sister        Lung    Colon cancer Neg Hx    Colon polyps Neg Hx    Esophageal cancer Neg Hx    Rectal cancer Neg Hx    Stomach cancer Neg Hx     Social History   Socioeconomic History   Marital status: Married    Spouse name: Maudry Mayhew    Number of children: Not on file   Years of education: Not on file   Highest education level: Not on file  Occupational History   Occupation: works in Scientist, research (medical).      Comment: [name]  Tobacco Use   Smoking status: Former    Packs/day: 1.00    Years: 45.00    Pack years: 45.00    Types: Cigarettes    Quit date: 09/08/2017    Years since quitting: 3.7   Smokeless tobacco: Never  Vaping Use   Vaping Use: Never used  Substance and Sexual Activity   Alcohol use: No    Alcohol/week: 0.0 standard drinks    Comment: hx of EtOH abuse   Drug use: No   Sexual activity: Not on file  Other Topics Concern    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 No active exercise.    Social Determinants of Health   Financial Resource Strain: Not on file  Food Insecurity: Not on file  Transportation Needs: Not on file  Physical Activity: Not on file  Stress: Not on file  Social Connections: Not on file  Intimate Partner Violence: Not on file    Outpatient Medications Prior to Visit  Medication Sig Dispense Refill   ACCU-CHEK GUIDE test strip TEST UPTO 4 TIMES A DAY 100 strip 11   acetaminophen (TYLENOL) 500 MG tablet Take 1,000 mg by mouth every 6 (six) hours as needed.     AMBULATORY NON FORMULARY MEDICATION Medication Name: Glucometer and strips to test up to once a day. Dx diabetes 1 Units 0   ARIPiprazole (ABILIFY) 5 MG tablet TAKE ONE TABLET (5 MG TOTAL) BY MOUTH DAILY  30 tablet 3   atorvastatin (LIPITOR) 40 MG tablet Take 1 tablet (40 mg total) by mouth at bedtime. Needs labs 90 tablet 3   azithromycin (ZITHROMAX) 250 MG tablet Take 1 tablet (250 mg total) by mouth daily. Take first 2 tablets together, then 1 every day until finished. 6 tablet 0   blood glucose meter kit and supplies Dispense based on patient and insurance preference. Use up to four times daily as directed. DX: E11.8 1 each 0   cephALEXin (KEFLEX) 500 MG capsule Take 1 capsule (500 mg total) by mouth 3 (three) times daily for 7 days. 21 capsule 0   cilostazol (PLETAL) 100 MG tablet Take 100 mg by mouth 2 (two) times daily.     dexamethasone (DECADRON) 4 MG tablet Take 1 tablet (4 mg total) by mouth 2 (two) times daily with a meal. 60 tablet 1   dexamethasone (DECADRON) 4 MG tablet Take 2 tablets (8 mg total) by mouth daily. Start the day after chemotherapy for 2 days. (Patient not taking: Reported on 05/26/2021) 30 tablet 1   dicyclomine (BENTYL) 10 MG capsule TAKE 1 CAPSULE (10 MG TOTAL) BY MOUTH 3 (THREE) TIMES DAILY BEFORE MEALS. 270 capsule 1   diphenoxylate-atropine (LOMOTIL) 2.5-0.025  MG tablet TAKE 2 TABLETS BY MOUTH 4 (FOUR) TIMES DAILY AS NEEDED FOR DIARRHEA OR LOOSE STOOLS. (Patient not taking: Reported on 05/26/2021) 100 tablet 0   dronabinol (MARINOL) 5 MG capsule TAKE 1 CAPSULE (5 MG TOTAL) BY MOUTH 2 (TWO) TIMES DAILY BEFORE LUNCH AND SUPPER. 60 capsule 0   DULoxetine (CYMBALTA) 60 MG capsule TAKE 1 CAPSULE BY MOUTH TWICE A DAY 120 capsule 1   eszopiclone (LUNESTA) 2 MG TABS tablet Take 1 tablet (2 mg total) by mouth at bedtime as needed. (Patient taking differently: Take 2 mg by mouth at bedtime as needed. Patient takes 1 tablet at bedtime) 30 tablet 5   KLOR-CON M20 20 MEQ tablet TAKE 2 TABLETS BY MOUTH 2 TIMES DAILY. 360 tablet 1   levothyroxine (SYNTHROID) 150 MCG tablet TAKE 1 TABLET BY MOUTH DAILY BEFORE BREAKFAST. 90 tablet 1   lidocaine-prilocaine (EMLA) cream Apply to affected area once 30 g 3   lisinopril (ZESTRIL) 20 MG tablet Take 20 mg by mouth daily.     LORazepam (ATIVAN) 0.5 MG tablet Take 1 tablet (0.5 mg total) by mouth every 12 (twelve) hours as needed for anxiety. (Patient not taking: Reported on 05/26/2021) 30 tablet 0   mirtazapine (REMERON SOL-TAB) 15 MG disintegrating tablet Take 15 mg by mouth at bedtime.     NUVIGIL 250 MG tablet Take 250 mg by mouth daily.     ondansetron (ZOFRAN) 8 MG tablet Take 1 tablet (8 mg total) by mouth 2 (two) times daily as needed for refractory nausea / vomiting. Start on day 3 after chemo. (Patient not taking: Reported on 05/26/2021) 30 tablet 1   orphenadrine (NORFLEX) 100 MG tablet Take 1 tablet (100 mg total) by mouth at bedtime. 30 tablet 2   pantoprazole (PROTONIX) 40 MG tablet Take 1 tablet by mouth daily. (Patient not taking: Reported on 05/26/2021)     pentoxifylline (TRENTAL) 400 MG CR tablet Take by mouth. (Patient not taking: Reported on 05/20/2021)     pregabalin (LYRICA) 200 MG capsule Take 1 capsule by mouth 3 (three) times daily.     prochlorperazine (COMPAZINE) 10 MG tablet Take 1 tablet (10 mg total)  by mouth every 6 (six) hours as needed (Nausea or vomiting). 30 tablet  1   SODIUM FLUORIDE 5000 PPM 1.1 % PSTE SMARTSIG:Sparingly Topical Every Night     Facility-Administered Medications Prior to Visit  Medication Dose Route Frequency Provider Last Rate Last Admin   dexamethasone (DECADRON) injection 10 mg  10 mg Intravenous Once Ennever, Rudell Cobb, MD        No Known Allergies  ROS Review of Systems    Objective:    Physical Exam  There were no vitals taken for this visit. Wt Readings from Last 3 Encounters:  06/03/21 195 lb (88.5 kg)  06/01/21 195 lb (88.5 kg)  05/26/21 200 lb (90.7 kg)     Health Maintenance Due  Topic Date Due   OPHTHALMOLOGY EXAM  Never done    There are no preventive care reminders to display for this patient.  Lab Results  Component Value Date   TSH 0.248 (L) 04/20/2021   Lab Results  Component Value Date   WBC 6.7 06/03/2021   HGB 11.3 (L) 06/03/2021   HCT 32.8 (L) 06/03/2021   MCV 93.4 06/03/2021   PLT 203 06/03/2021   Lab Results  Component Value Date   NA 133 (L) 06/03/2021   K 3.5 06/03/2021   CO2 22 06/03/2021   GLUCOSE 150 (H) 06/03/2021   BUN 20 06/03/2021   CREATININE 0.81 06/03/2021   BILITOT 0.3 06/03/2021   ALKPHOS 70 06/03/2021   AST 25 06/03/2021   ALT 31 06/03/2021   PROT 6.1 (L) 06/03/2021   ALBUMIN 3.1 (L) 06/03/2021   CALCIUM 8.4 (L) 06/03/2021   ANIONGAP 9 06/03/2021   Lab Results  Component Value Date   CHOL 111 03/05/2020   Lab Results  Component Value Date   HDL 44 03/05/2020   Lab Results  Component Value Date   LDLCALC 50 03/05/2020   Lab Results  Component Value Date   TRIG 91 03/05/2020   Lab Results  Component Value Date   CHOLHDL 2.5 03/05/2020   Lab Results  Component Value Date   HGBA1C 6.8 05/26/2021      Assessment & Plan:   Problem List Items Addressed This Visit   None   No orders of the defined types were placed in this encounter.   Follow-up: No follow-ups on  file.    Beatrice Lecher, MD

## 2021-06-11 ENCOUNTER — Inpatient Hospital Stay: Payer: Medicare Other | Admitting: Dietician

## 2021-06-11 NOTE — Progress Notes (Signed)
Patient scheduled for nutrition follow-up via telephone. Patient is currently admitted to [facility] with pneumonia. Today's appointment has been cancelled. Will continue to follow for discharge planning and will reschedule as able.

## 2021-06-15 ENCOUNTER — Encounter: Payer: Self-pay | Admitting: *Deleted

## 2021-06-15 NOTE — Progress Notes (Signed)
Eddie Hernandez continues to be hospitalized in the [place]. Will continue to follow for post discharge needs and follow up.   Oncology Nurse Navigator Documentation  Oncology Nurse Navigator Flowsheets 06/15/2021  Abnormal Finding Date -  Confirmed Diagnosis Date -  Diagnosis Status -  Phase of Treatment -  Chemotherapy Actual Start Date: -  Chemotherapy Expected End Date: -  Navigator Follow Up Date: 06/23/2021  Navigator Follow Up Reason: Appointment Review  Navigator Location [facility]  Navigator Encounter Type Appt/Treatment Plan Review  Telephone -  Treatment Initiated Date -  Patient Visit Type MedOnc  Treatment Phase Active Tx  Barriers/Navigation Needs Coordination of Care;Education  Education -  Interventions None Required  Acuity Level 2-Minimal Needs (1-2 Barriers Identified)  Coordination of Care -  Education Method -  Support Groups/Services Friends and Family  Time Spent with Patient 15

## 2021-06-23 ENCOUNTER — Encounter: Payer: Self-pay | Admitting: *Deleted

## 2021-06-23 NOTE — Progress Notes (Signed)
Patient continues to be hospitalized in the [facility]. Will continue to follow for post discharge needs and follow up.   Oncology Nurse Navigator Documentation  Oncology Nurse Navigator Flowsheets 06/23/2021  Abnormal Finding Date -  Confirmed Diagnosis Date -  Diagnosis Status -  Phase of Treatment -  Chemotherapy Actual Start Date: -  Chemotherapy Expected End Date: -  Navigator Follow Up Date: 06/29/2021  Navigator Follow Up Reason: Appointment Review  Navigator Location [facility]  Navigator Encounter Type Appt/Treatment Plan Review  Telephone -  Treatment Initiated Date -  Patient Visit Type MedOnc  Treatment Phase Active Tx  Barriers/Navigation Needs Coordination of Care;Education  Education -  Interventions None Required  Acuity Level 2-Minimal Needs (1-2 Barriers Identified)  Coordination of Care -  Education Method -  Support Groups/Services Friends and Family  Time Spent with Patient 15

## 2021-06-25 ENCOUNTER — Telehealth: Payer: Self-pay | Admitting: Family Medicine

## 2021-06-25 NOTE — Telephone Encounter (Signed)
Patient called to cancel his appt on 1/27. Per patient been in the hospital 16 days. Would like Dr. Madilyn Fireman to give him a call at earliest convenience.Marland Kitchen

## 2021-06-25 NOTE — Telephone Encounter (Signed)
Called pt. He is in Step down. Was placed on BiPAP yesterday but doing a little better today.  Has been in the hospital for 16 days.  He is weak but has some day were he is walking. He had bronchoscopy and they are waiting for culture results.

## 2021-06-26 ENCOUNTER — Encounter: Payer: Self-pay | Admitting: *Deleted

## 2021-06-26 ENCOUNTER — Inpatient Hospital Stay: Payer: Medicare Other | Admitting: Family Medicine

## 2021-06-26 NOTE — Progress Notes (Signed)
Spoke to patient and his wife today. Patient remains hospitalized, going on week three. Per the wife he had a set back yesterday and required bipap support. Today he is back on 2Lnc. She states he has been treatment for double pneumonia and now they have added fungal coverage for suspected PCP. She states his CRP is elevated and he will likely go ome on oxygen. This morning the MD stated he would likely be admitted for awhile longer.   Spoke with patient and provided support.  All the above shared with Dr Marin Olp.  Oncology Nurse Navigator Documentation  Oncology Nurse Navigator Flowsheets 06/26/2021  Abnormal Finding Date -  Confirmed Diagnosis Date -  Diagnosis Status -  Phase of Treatment -  Chemotherapy Actual Start Date: -  Chemotherapy Expected End Date: -  Navigator Follow Up Date: 06/29/2021  Navigator Follow Up Reason: Appointment Review  Navigator Location [facility]  Navigator Encounter Type Telephone  Telephone Patient Update;Incoming Call  Treatment Initiated Date -  Patient Visit Type MedOnc  Treatment Phase Active Tx  Barriers/Navigation Needs Coordination of Care;Education  Education -  Interventions Psycho-Social Support  Acuity Level 2-Minimal Needs (1-2 Barriers Identified)  Coordination of Care -  Education Method -  Support Groups/Services Friends and Family  Time Spent with Patient 30

## 2021-06-29 ENCOUNTER — Encounter: Payer: Self-pay | Admitting: *Deleted

## 2021-06-29 NOTE — Progress Notes (Signed)
Patient continues to be hospitalized in the [facility]. Will continue to follow for post discharge needs and follow up.   Oncology Nurse Navigator Documentation  Oncology Nurse Navigator Flowsheets 06/29/2021  Abnormal Finding Date -  Confirmed Diagnosis Date -  Diagnosis Status -  Phase of Treatment -  Chemotherapy Actual Start Date: -  Chemotherapy Expected End Date: -  Navigator Follow Up Date: 07/07/2021  Navigator Follow Up Reason: Appointment Review  Navigator Location [facility]  Navigator Encounter Type Appt/Treatment Plan Review  Telephone -  Treatment Initiated Date -  Patient Visit Type MedOnc  Treatment Phase Active Tx  Barriers/Navigation Needs Coordination of Care;Education  Education -  Interventions None Required  Acuity Level 2-Minimal Needs (1-2 Barriers Identified)  Coordination of Care -  Education Method -  Support Groups/Services Friends and Family  Time Spent with Patient 15

## 2021-07-07 ENCOUNTER — Encounter: Payer: Self-pay | Admitting: *Deleted

## 2021-07-07 NOTE — Progress Notes (Signed)
Patient was discharged from [place] on 06/30/2021 to a [facility]. He did seek assessment in the ED on 07/05/2021 but was discharged back to SNF with an assumed COPD exacerbation. Per the discharge note from [facility] patient was continued on steroids and antibiotics post discharge. There is some suspicion that drug induced pneumonitis is also contributing to his respiratory status.   Reviewed with Dr Marin Olp. He would like to see patient in the office in 2 weeks. Message sent to scheduling.   Oncology Nurse Navigator Documentation  Oncology Nurse Navigator Flowsheets 07/07/2021  Abnormal Finding Date -  Confirmed Diagnosis Date -  Diagnosis Status -  Phase of Treatment -  Chemotherapy Actual Start Date: -  Chemotherapy Expected End Date: -  Navigator Follow Up Date: 07/21/2021  Navigator Follow Up Reason: Follow-up Appointment  Navigator Location [facility]  Navigator Encounter Type Appt/Treatment Plan Review  Telephone -  Treatment Initiated Date -  Patient Visit Type MedOnc  Treatment Phase Active Tx  Barriers/Navigation Needs Coordination of Care;Education  Education -  Interventions Coordination of Care  Acuity Level 2-Minimal Needs (1-2 Barriers Identified)  Coordination of Care Appts  Education Method -  Support Groups/Services Friends and Family  Time Spent with Patient 30

## 2021-07-08 ENCOUNTER — Encounter: Payer: Self-pay | Admitting: Hematology & Oncology

## 2021-07-10 ENCOUNTER — Inpatient Hospital Stay: Payer: Medicare Other | Admitting: Family Medicine

## 2021-07-10 ENCOUNTER — Other Ambulatory Visit: Payer: Self-pay | Admitting: Hematology & Oncology

## 2021-07-14 ENCOUNTER — Encounter: Payer: Self-pay | Admitting: Hematology & Oncology

## 2021-07-15 ENCOUNTER — Encounter: Payer: Self-pay | Admitting: *Deleted

## 2021-07-15 NOTE — Progress Notes (Signed)
Received a call from patient's wife, Eddie Hernandez. She is concerned as patient, who is at an inpatient rehab facility, took a turn on Saturday. She states up until that point that he was making progress and had started performing most his ADLs independently and the plan was for discharge this week. She states on Saturday he became lethargic and stopped eating. He hasn't eaten since Saturday and is mostly asleep. She states when she can wake him up, he appears oriented. He c/o pain to his Right Lung and Left Shoulder. His pulse is about 110s and an EKG the facility completed showed sinus tach. She had asked the facility to call EMS and bring him to the hospital but they stated his vital were normal and they would just send him back. She also reports that PT attempted to work with patient today and he was listed back as "max assist". She doesn't know what to do.  Spoke with Dr Marin Olp. He recommends having the patient brought to the ED for workup due to the significant status change.    Informed Eddie Hernandez about this recommendation. She will request EMS transfer.    Oncology Nurse Navigator Documentation  Oncology Nurse Navigator Flowsheets 07/15/2021  Abnormal Finding Date -  Confirmed Diagnosis Date -  Diagnosis Status -  Phase of Treatment -  Chemotherapy Actual Start Date: -  Chemotherapy Expected End Date: -  Navigator Follow Up Date: 07/21/2021  Navigator Follow Up Reason: Follow-up Appointment  Navigator Location [facility]  Navigator Encounter Type Telephone  Telephone Patient Update;Symptom Mgt;Incoming Call  Treatment Initiated Date -  Patient Visit Type MedOnc  Treatment Phase Active Tx  Barriers/Navigation Needs Coordination of Care;Education  Education Pain/ Symptom Management;Other  Interventions Education;Psycho-Social Support  Acuity Level 2-Minimal Needs (1-2 Barriers Identified)  Coordination of Care -  Education Method Verbal  Support Groups/Services Friends and Family  Time Spent  with Patient 64

## 2021-07-16 ENCOUNTER — Encounter: Payer: Self-pay | Admitting: *Deleted

## 2021-07-16 NOTE — Progress Notes (Signed)
Patient was brought to the ED and admitted for septic shock. Notified Dr Marin Olp of admission.   Attempted to call patient's wife, Maudry Mayhew, for update but unable to make contact. Message left on personal voicemail.   Oncology Nurse Navigator Documentation  Oncology Nurse Navigator Flowsheets 07/16/2021  Abnormal Finding Date -  Confirmed Diagnosis Date -  Diagnosis Status -  Phase of Treatment -  Chemotherapy Actual Start Date: -  Chemotherapy Expected End Date: -  Navigator Follow Up Date: 07/21/2021  Navigator Follow Up Reason: Appointment Review  Navigator Location [facility]  Navigator Encounter Type Appt/Treatment Plan Review;Telephone  Telephone Outgoing Call  Treatment Initiated Date -  Patient Visit Type MedOnc  Treatment Phase Active Tx  Barriers/Navigation Needs Coordination of Care;Education  Education -  Interventions None Required  Acuity Level 2-Minimal Needs (1-2 Barriers Identified)  Coordination of Care -  Education Method -  Support Groups/Services Friends and Family  Time Spent with Patient 15

## 2021-07-19 ENCOUNTER — Other Ambulatory Visit: Payer: Self-pay | Admitting: Family Medicine

## 2021-07-20 ENCOUNTER — Telehealth: Payer: Self-pay

## 2021-07-20 ENCOUNTER — Encounter: Payer: Self-pay | Admitting: Family Medicine

## 2021-07-21 ENCOUNTER — Inpatient Hospital Stay: Payer: Medicare Other

## 2021-07-21 ENCOUNTER — Encounter: Payer: Self-pay | Admitting: *Deleted

## 2021-07-21 ENCOUNTER — Inpatient Hospital Stay: Payer: Medicare Other | Admitting: Hematology & Oncology

## 2021-07-21 NOTE — Progress Notes (Signed)
Received notification that patient expired at [facility]. Condolence card mailed to patient's wife.   Oncology Nurse Navigator Documentation  Oncology Nurse Navigator Flowsheets 07/21/2021  Abnormal Finding Date -  Confirmed Diagnosis Date -  Diagnosis Status -  Phase of Treatment -  Chemotherapy Actual Start Date: -  Chemotherapy Expected End Date: -  Navigator Follow Up Date: -  Navigator Follow Up Reason: -  Navigation Complete Date: 07/21/2021  Post Navigation: Continue to Follow Patient? No  Reason Not Navigating Patient: Airline pilot Encounter Type Other:  Telephone -  Treatment Initiated Date -  Patient Visit Type -  Treatment Phase -  Barriers/Navigation Needs -  Education -  Interventions Psycho-Social Support  Acuity -  Coordination of Care -  Education Method -  Support Groups/Services -  Time Spent with Patient 15

## 2021-07-29 NOTE — Telephone Encounter (Signed)
Dr Marin Olp contacted pt's wife, Maudry Mayhew after receiving message from on call service that pt was transitioned to Hospice care at [facility] over the weekend and BIPAP was being d/c today.  Per Maudry Mayhew, pt passed away today. Dr Marin Olp communicated sincerest condolences. Flowers ordered.

## 2021-07-29 DEATH — deceased

## 2021-08-31 ENCOUNTER — Ambulatory Visit: Payer: BLUE CROSS/BLUE SHIELD | Admitting: Family Medicine

## 2021-09-07 ENCOUNTER — Ambulatory Visit: Payer: BLUE CROSS/BLUE SHIELD | Admitting: Family Medicine

## 2022-06-10 IMAGING — CT CT HEAD WO/W CM
3 of 6 series · 16 of 47 positions shown, 19 images · IV contrast (omnipaque)
Comparison: None.

CLINICAL DATA: Metastatic gastric cancer

EXAM:
CT HEAD WITHOUT AND WITH CONTRAST
TECHNIQUE: Contiguous axial images were obtained from the base of the skull
through the vertex without and with intravenous contrast
CONTRAST:  75mL OMNIPAQUE IOHEXOL 300 MG/ML  SOLN

[Series 2: head wo · axial · 0.47mm/px · z∈[-168,-18]mm · 11 of 36 slices shown, 14 images]
[im 3/36  brain]
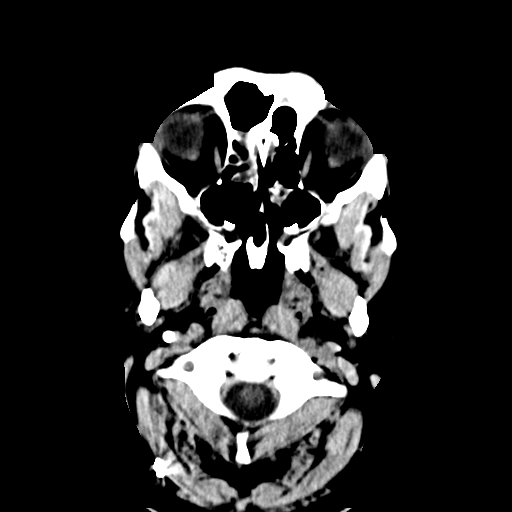
[im 3/36  bone]
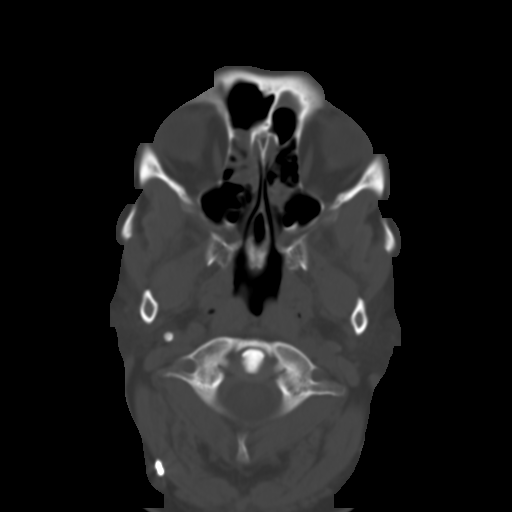
[im 6/36  brain]
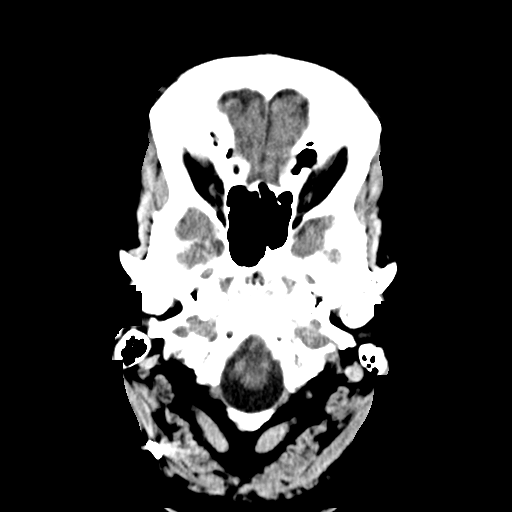
[im 8/36  brain]
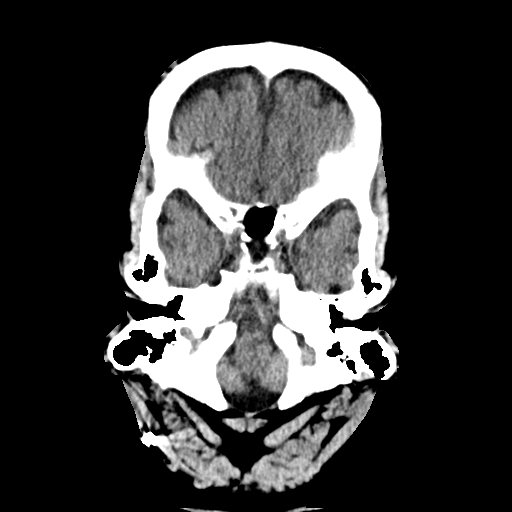
[im 13/36  brain]
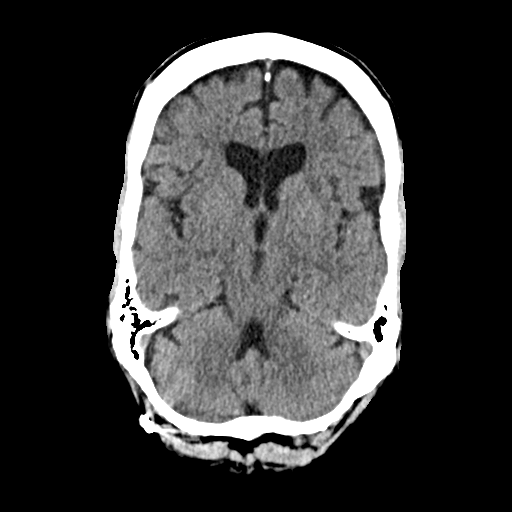
[im 16/36  brain]
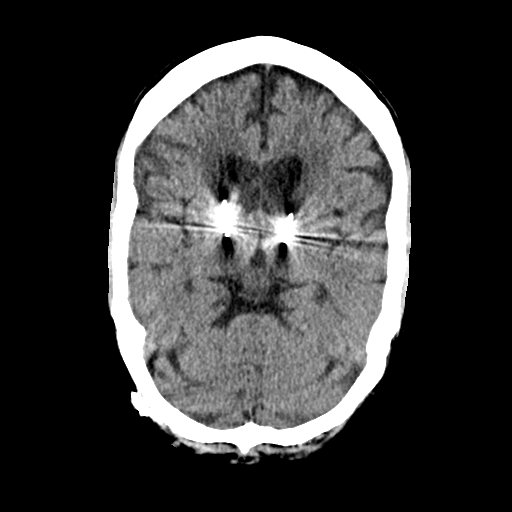
[im 16/36  bone]
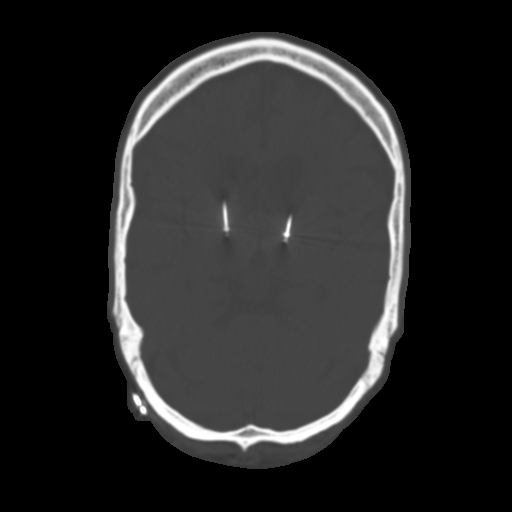
[im 18/36  brain]
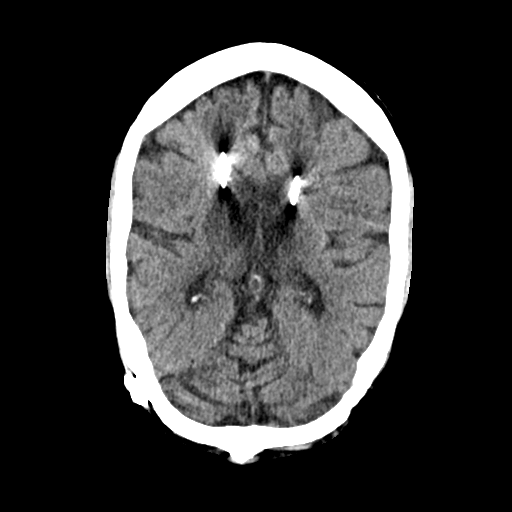
[im 21/36  brain]
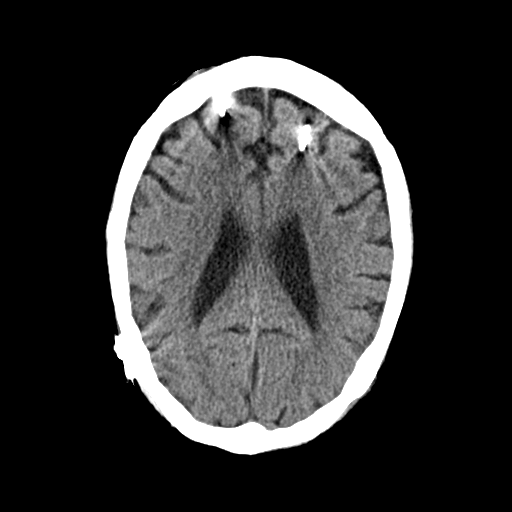
[im 23/36  brain]
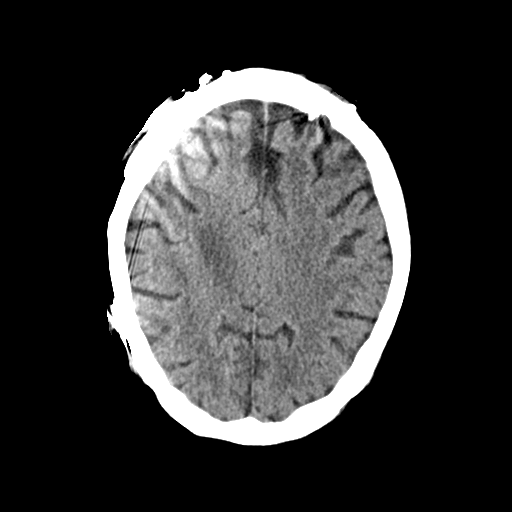
[im 28/36  brain]
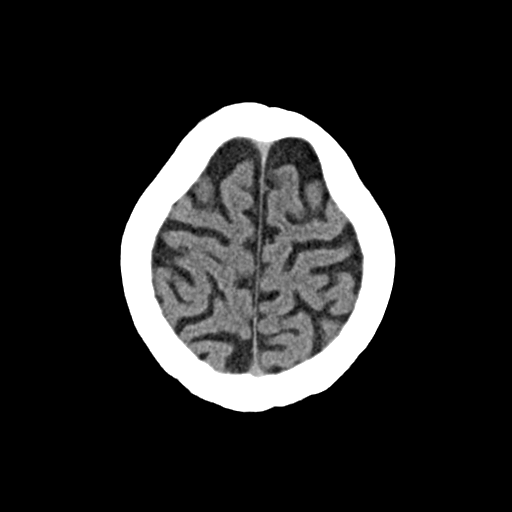
[im 28/36  bone]
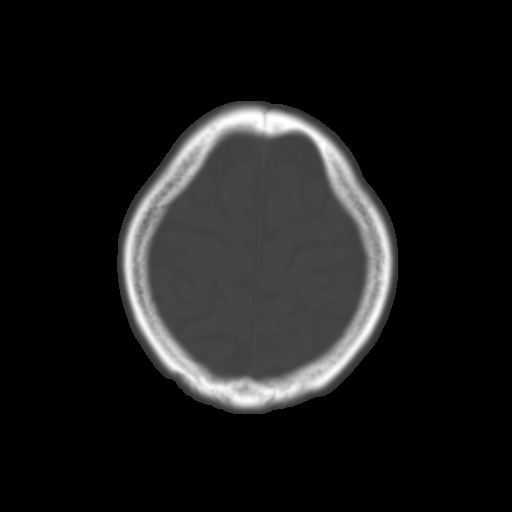
[im 31/36  brain]
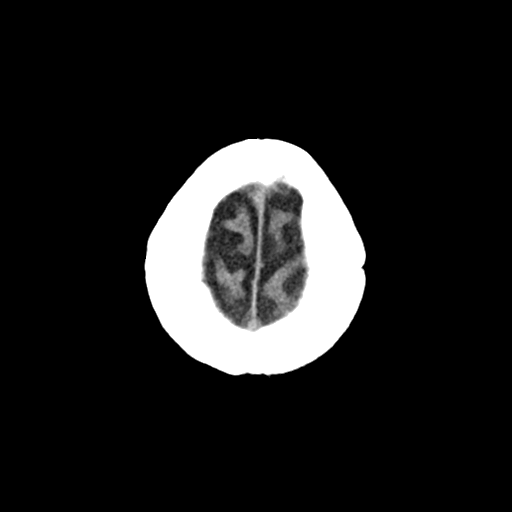
[im 33/36  brain]
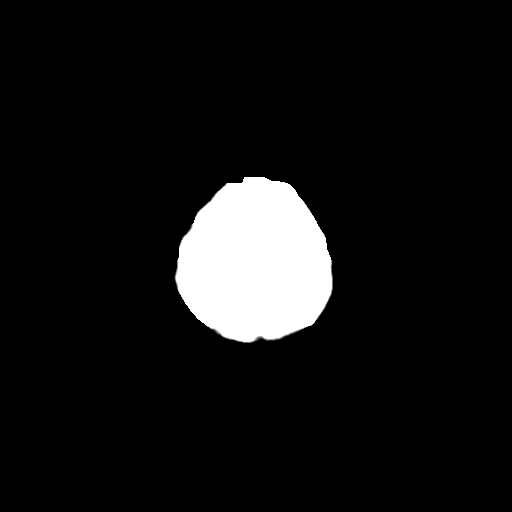

[Series 4: cor wo soft · coronal · 0.34mm/px · 3 of 73 slices shown]
[im 19/73  brain]
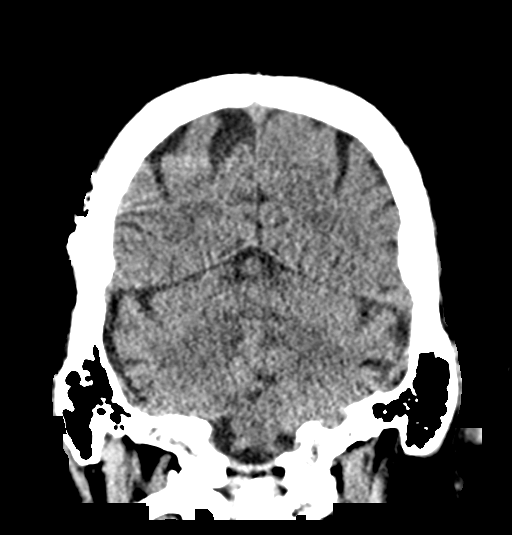
[im 37/73  brain]
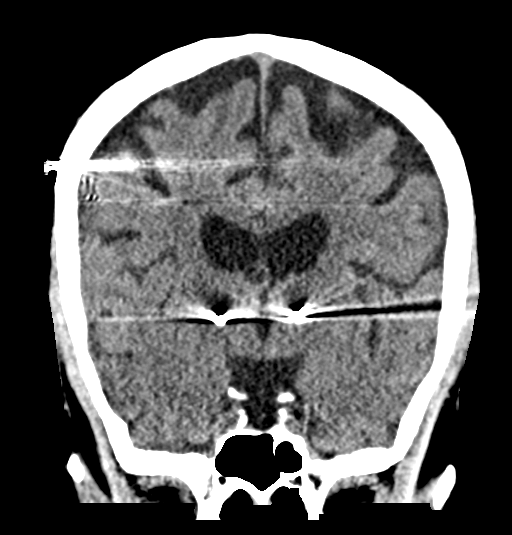
[im 55/73  brain]
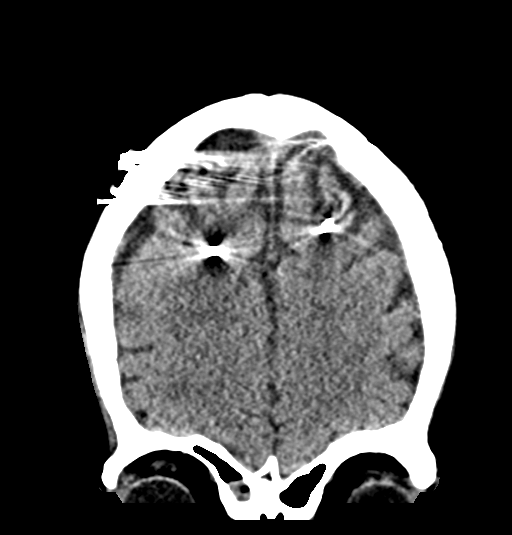

[Series 5: sag wo soft · sagittal · 0.36mm/px · 2 of 56 slices shown]
[im 19/56  brain]
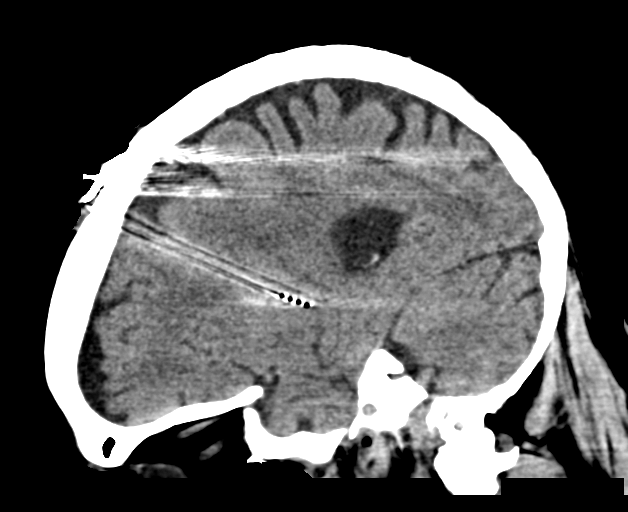
[im 37/56  brain]
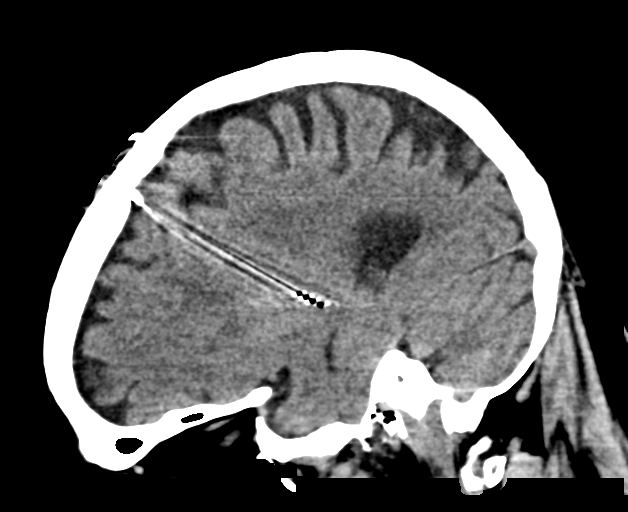

[16 of 47 positions shown; findings below may reference images not displayed]

FINDINGS: Brain: There are bilateral anterior frontal approach stimulator
leads terminating in the subthalamic region. Associated streak
artifact. There is no acute intracranial hemorrhage, mass, mass
effect, or edema. No abnormal enhancement. Gray-white
differentiation is preserved. There is no extra-axial fluid
collection. Prominence of the ventricles and sulci reflects minor
generalized probable volume loss. Patchy hypoattenuation in the
supratentorial white matter is nonspecific but probably reflects
minor chronic microvascular ischemic changes.

Vascular: There is atherosclerotic calcification at the skull base.

Skull: Calvarium is unremarkable apart from small frontal burr
holes.

Sinuses/Orbits: Patchy ethmoid mucosal thickening. Visualized orbits
are unremarkable.

Other: None.
IMPRESSION: No evidence of intracranial metastatic disease, noting presence of
streak artifact from stimulator leads.
